# Patient Record
Sex: Female | Born: 1955 | Race: White | Hispanic: No | Marital: Married | Smoking: Former smoker
Health system: Southern US, Community
[De-identification: ages and names within clinical notes are randomized; demographics above are authoritative.]

## PROBLEM LIST (undated history)

## (undated) DIAGNOSIS — E119 Type 2 diabetes mellitus without complications: Secondary | ICD-10-CM

## (undated) DIAGNOSIS — Z860100 Personal history of colon polyps, unspecified: Secondary | ICD-10-CM

## (undated) DIAGNOSIS — Z720 Tobacco use: Secondary | ICD-10-CM

## (undated) DIAGNOSIS — E785 Hyperlipidemia, unspecified: Secondary | ICD-10-CM

## (undated) DIAGNOSIS — F419 Anxiety disorder, unspecified: Secondary | ICD-10-CM

## (undated) DIAGNOSIS — E786 Lipoprotein deficiency: Secondary | ICD-10-CM

## (undated) HISTORY — DX: Personal history of colonic polyps: Z86.010

## (undated) HISTORY — PX: CHOLECYSTECTOMY: SHX55

## (undated) HISTORY — DX: Personal history of colon polyps, unspecified: Z86.0100

## (undated) HISTORY — DX: Tobacco use: Z72.0

## (undated) HISTORY — DX: Hyperlipidemia, unspecified: E78.5

## (undated) HISTORY — PX: TONSILLECTOMY: SUR1361

## (undated) HISTORY — PX: APPENDECTOMY: SHX54

---

## 1999-10-29 ENCOUNTER — Other Ambulatory Visit: Admission: RE | Admit: 1999-10-29 | Discharge: 1999-10-29 | Payer: Self-pay | Admitting: Gynecology

## 2001-08-09 ENCOUNTER — Encounter: Payer: Self-pay | Admitting: Family Medicine

## 2001-08-09 ENCOUNTER — Encounter: Admission: RE | Admit: 2001-08-09 | Discharge: 2001-08-09 | Payer: Self-pay | Admitting: Family Medicine

## 2003-11-17 ENCOUNTER — Ambulatory Visit (HOSPITAL_COMMUNITY): Admission: RE | Admit: 2003-11-17 | Discharge: 2003-11-17 | Payer: Self-pay | Admitting: Internal Medicine

## 2005-06-20 ENCOUNTER — Other Ambulatory Visit: Admission: RE | Admit: 2005-06-20 | Discharge: 2005-06-20 | Payer: Self-pay | Admitting: Obstetrics and Gynecology

## 2005-07-04 ENCOUNTER — Encounter: Admission: RE | Admit: 2005-07-04 | Discharge: 2005-07-04 | Payer: Self-pay | Admitting: Obstetrics and Gynecology

## 2006-06-09 ENCOUNTER — Ambulatory Visit (HOSPITAL_COMMUNITY): Admission: RE | Admit: 2006-06-09 | Discharge: 2006-06-09 | Payer: Self-pay | Admitting: Internal Medicine

## 2006-12-18 ENCOUNTER — Ambulatory Visit (HOSPITAL_COMMUNITY): Admission: RE | Admit: 2006-12-18 | Discharge: 2006-12-18 | Payer: Self-pay | Admitting: Obstetrics and Gynecology

## 2007-01-11 ENCOUNTER — Ambulatory Visit (HOSPITAL_COMMUNITY): Admission: RE | Admit: 2007-01-11 | Discharge: 2007-01-11 | Payer: Self-pay | Admitting: Internal Medicine

## 2008-05-29 ENCOUNTER — Encounter: Admission: RE | Admit: 2008-05-29 | Discharge: 2008-06-27 | Payer: Self-pay | Admitting: Internal Medicine

## 2008-08-01 ENCOUNTER — Encounter: Admission: RE | Admit: 2008-08-01 | Discharge: 2008-08-01 | Payer: Self-pay | Admitting: Internal Medicine

## 2008-10-25 ENCOUNTER — Ambulatory Visit (HOSPITAL_COMMUNITY): Admission: RE | Admit: 2008-10-25 | Discharge: 2008-10-25 | Payer: Self-pay | Admitting: Internal Medicine

## 2009-11-27 ENCOUNTER — Encounter: Admission: RE | Admit: 2009-11-27 | Discharge: 2009-11-27 | Payer: Self-pay | Admitting: Internal Medicine

## 2010-07-25 ENCOUNTER — Ambulatory Visit (HOSPITAL_COMMUNITY): Admission: RE | Admit: 2010-07-25 | Discharge: 2010-07-25 | Payer: Self-pay | Admitting: Internal Medicine

## 2010-12-19 LAB — HM COLONOSCOPY

## 2011-01-28 ENCOUNTER — Other Ambulatory Visit: Payer: Self-pay | Admitting: Internal Medicine

## 2011-01-28 ENCOUNTER — Other Ambulatory Visit (HOSPITAL_COMMUNITY): Payer: Self-pay | Admitting: Internal Medicine

## 2011-01-28 DIAGNOSIS — R197 Diarrhea, unspecified: Secondary | ICD-10-CM

## 2011-01-28 DIAGNOSIS — R509 Fever, unspecified: Secondary | ICD-10-CM

## 2011-01-28 DIAGNOSIS — R1032 Left lower quadrant pain: Secondary | ICD-10-CM

## 2011-01-28 DIAGNOSIS — R109 Unspecified abdominal pain: Secondary | ICD-10-CM

## 2011-01-29 ENCOUNTER — Ambulatory Visit
Admission: RE | Admit: 2011-01-29 | Discharge: 2011-01-29 | Disposition: A | Discharge status: Home / Self Care | Payer: 59 | Source: Ambulatory Visit | Attending: Internal Medicine | Admitting: Internal Medicine

## 2011-01-29 DIAGNOSIS — R509 Fever, unspecified: Secondary | ICD-10-CM

## 2011-01-29 DIAGNOSIS — R197 Diarrhea, unspecified: Secondary | ICD-10-CM

## 2011-01-29 DIAGNOSIS — R109 Unspecified abdominal pain: Secondary | ICD-10-CM

## 2011-01-31 ENCOUNTER — Other Ambulatory Visit (HOSPITAL_COMMUNITY): Payer: Self-pay

## 2011-01-31 ENCOUNTER — Ambulatory Visit (HOSPITAL_COMMUNITY): Payer: 59

## 2012-04-29 ENCOUNTER — Ambulatory Visit (HOSPITAL_COMMUNITY)
Admission: RE | Admit: 2012-04-29 | Discharge: 2012-04-29 | Disposition: A | Discharge status: Home / Self Care | Payer: 59 | Source: Ambulatory Visit | Attending: Emergency Medicine | Admitting: Emergency Medicine

## 2012-04-29 ENCOUNTER — Other Ambulatory Visit (HOSPITAL_COMMUNITY): Payer: Self-pay | Admitting: Emergency Medicine

## 2012-04-29 DIAGNOSIS — R52 Pain, unspecified: Secondary | ICD-10-CM

## 2012-04-29 DIAGNOSIS — M25519 Pain in unspecified shoulder: Secondary | ICD-10-CM | POA: Insufficient documentation

## 2012-04-29 DIAGNOSIS — M7730 Calcaneal spur, unspecified foot: Secondary | ICD-10-CM | POA: Insufficient documentation

## 2012-04-29 DIAGNOSIS — M25579 Pain in unspecified ankle and joints of unspecified foot: Secondary | ICD-10-CM | POA: Insufficient documentation

## 2012-04-29 DIAGNOSIS — M542 Cervicalgia: Secondary | ICD-10-CM | POA: Insufficient documentation

## 2012-04-29 DIAGNOSIS — M79609 Pain in unspecified limb: Secondary | ICD-10-CM | POA: Insufficient documentation

## 2012-12-02 ENCOUNTER — Emergency Department (HOSPITAL_COMMUNITY)
Admission: EM | Admit: 2012-12-02 | Discharge: 2012-12-02 | Disposition: A | Discharge status: Home / Self Care | Payer: 59 | Attending: Emergency Medicine | Admitting: Emergency Medicine

## 2012-12-02 ENCOUNTER — Encounter (HOSPITAL_COMMUNITY): Payer: Self-pay | Admitting: *Deleted

## 2012-12-02 DIAGNOSIS — Z79899 Other long term (current) drug therapy: Secondary | ICD-10-CM | POA: Insufficient documentation

## 2012-12-02 DIAGNOSIS — E7800 Pure hypercholesterolemia, unspecified: Secondary | ICD-10-CM | POA: Insufficient documentation

## 2012-12-02 DIAGNOSIS — T48905A Adverse effect of unspecified agents primarily acting on the respiratory system, initial encounter: Secondary | ICD-10-CM | POA: Insufficient documentation

## 2012-12-02 DIAGNOSIS — Y929 Unspecified place or not applicable: Secondary | ICD-10-CM | POA: Insufficient documentation

## 2012-12-02 DIAGNOSIS — F17200 Nicotine dependence, unspecified, uncomplicated: Secondary | ICD-10-CM | POA: Insufficient documentation

## 2012-12-02 DIAGNOSIS — E119 Type 2 diabetes mellitus without complications: Secondary | ICD-10-CM | POA: Insufficient documentation

## 2012-12-02 DIAGNOSIS — R51 Headache: Secondary | ICD-10-CM | POA: Insufficient documentation

## 2012-12-02 DIAGNOSIS — Z888 Allergy status to other drugs, medicaments and biological substances status: Secondary | ICD-10-CM | POA: Insufficient documentation

## 2012-12-02 DIAGNOSIS — L299 Pruritus, unspecified: Secondary | ICD-10-CM | POA: Insufficient documentation

## 2012-12-02 DIAGNOSIS — Y939 Activity, unspecified: Secondary | ICD-10-CM | POA: Insufficient documentation

## 2012-12-02 DIAGNOSIS — F411 Generalized anxiety disorder: Secondary | ICD-10-CM | POA: Insufficient documentation

## 2012-12-02 DIAGNOSIS — T7840XA Allergy, unspecified, initial encounter: Secondary | ICD-10-CM

## 2012-12-02 DIAGNOSIS — J069 Acute upper respiratory infection, unspecified: Secondary | ICD-10-CM | POA: Insufficient documentation

## 2012-12-02 HISTORY — DX: Anxiety disorder, unspecified: F41.9

## 2012-12-02 HISTORY — DX: Lipoprotein deficiency: E78.6

## 2012-12-02 HISTORY — DX: Type 2 diabetes mellitus without complications: E11.9

## 2012-12-02 LAB — BASIC METABOLIC PANEL: Calcium: 9 mg/dL (ref 8.4–10.5)

## 2012-12-02 LAB — CBC WITH DIFFERENTIAL/PLATELET
Basophils Relative: 0 % (ref 0–1)
HCT: 47.5 % — ABNORMAL HIGH (ref 36.0–46.0)
Hemoglobin: 17.2 g/dL — ABNORMAL HIGH (ref 12.0–15.0)
Lymphocytes Relative: 25 % (ref 12–46)
MCH: 32.5 pg (ref 26.0–34.0)
MCV: 89.6 fL (ref 78.0–100.0)
RBC: 5.3 MIL/uL — ABNORMAL HIGH (ref 3.87–5.11)
RDW: 12.3 % (ref 11.5–15.5)
WBC: 9.9 10*3/uL (ref 4.0–10.5)

## 2012-12-02 MED ORDER — EPINEPHRINE HCL 1 MG/ML IJ SOLN
0.3000 mg | Freq: Once | INTRAMUSCULAR | Status: AC
  Administered 2012-12-02: 0.3 mg via INTRAMUSCULAR
  Filled 2012-12-02: qty 1

## 2012-12-02 MED ORDER — METHYLPREDNISOLONE SODIUM SUCC 125 MG IJ SOLR
125.0000 mg | Freq: Once | INTRAMUSCULAR | Status: AC
  Administered 2012-12-02: 125 mg via INTRAVENOUS
  Filled 2012-12-02: qty 2

## 2012-12-02 MED ORDER — DIPHENHYDRAMINE HCL 50 MG/ML IJ SOLN
INTRAMUSCULAR | Status: AC
  Administered 2012-12-02: 25 mg
  Filled 2012-12-02: qty 1

## 2012-12-02 NOTE — ED Notes (Signed)
Patient here with possible allergic reaction?  Patient took her levaquin (1st dose this am) and right after she took the medication, she became lethargic, altered mental status when [facility] arrived.  Syncopal episode per [facility], per husband, she had headache on the back side of her head and  Neck pain.  Patient is alert and oriented x 3.  Equal grips.  MAE.  Patient denies pain and states that she feels tired.

## 2012-12-02 NOTE — ED Notes (Addendum)
50mg  of benadryl, 50 mg zantac IV, and 4mg  zofran PTA, 500cc of ns, cbg 260

## 2012-12-02 NOTE — ED Provider Notes (Signed)
History     CSN: [id number]  Arrival date & time 12/02/12  9629   First MD Initiated Contact with Patient 12/02/12 1030      Chief Complaint  Patient presents with  . Allergic Reaction    (Consider location/radiation/quality/duration/timing/severity/associated sxs/prior treatment) Patient is a 57 y.o. female presenting with allergic reaction. The history is provided by the patient.  Allergic Reaction  She was prescribed levofloxacin yesterday for a respiratory tract infection. She took her first dose this morning and shortly after that started itching and breaking out in a rash. She was noted to be somewhat lethargic and is reported to have had a syncopal episode. She has been having an intermittent occipital headache. She denies difficulty breathing or swallowing currently. She has taken Levaquin in the past without problems and has never had any drug allergies.  Past Medical History  Diagnosis Date  . Diabetes mellitus without complication   . Anxiety   . Hypocholesteremia     Past Surgical History  Procedure Laterality Date  . Cesarean section    . Tonsillectomy    . Appendectomy    . Cholecystectomy      No family history on file.  History  Substance Use Topics  . Smoking status: Current Every Day Smoker -- 0.50 packs/day    Types: Cigarettes  . Smokeless tobacco: Not on file  . Alcohol Use: No    OB History   Grav Para Term Preterm Abortions TAB SAB Ect Mult Living                  Review of Systems  All other systems reviewed and are negative.    Allergies  Latex and Levaquin  Home Medications   Current Outpatient Rx  Name  Route  Sig  Dispense  Refill  . Citalopram Hydrobromide (CELEXA PO)   Oral   Take by mouth.         . Glimepiride (AMARYL PO)   Oral   Take by mouth.         . Ibuprofen (ADVIL PO)   Oral   Take by mouth.         . Montelukast Sodium (SINGULAIR PO)   Oral   Take by mouth.           BP 90/63  Pulse 64   Temp(Src) 97.8 F (36.6 C) (Oral)  Resp 18  SpO2 97%  Physical Exam  Nursing note and vitals reviewed.  57 year old female, resting comfortably and in no acute distress. Vital signs are significant for borderline bradycardia with blood pressure of 90/63. However, her husband states that she normally runs a low blood pressure. Oxygen saturation is 97%, which is normal. Head is normocephalic and atraumatic. PERRLA, EOMI. Oropharynx shows mild edema of the uvula. Neck is nontender and supple without adenopathy or JVD. Back is nontender and there is no CVA tenderness. Lungs are clear without rales, wheezes, or rhonchi. Chest is nontender. Heart has regular rate and rhythm without murmur. Abdomen is soft, flat, nontender without masses or hepatosplenomegaly and peristalsis is normoactive. Extremities have no cyanosis or edema, full range of motion is present. Skin is warm and dry. Diffuse erythematous rash is present without any urticarial lesions. Neurologic: Mental status is normal, cranial nerves are intact, there are no motor or sensory deficits.  ED Course  Procedures (including critical care time)  Results for orders placed during the hospital encounter of 12/02/12  CBC WITH DIFFERENTIAL  Result Value Range   WBC 9.9  4.0 - 10.5 K/uL   RBC 5.30 (*) 3.87 - 5.11 MIL/uL   Hemoglobin 17.2 (*) 12.0 - 15.0 g/dL   HCT 96.0 (*) 45.4 - 09.8 %   MCV 89.6  78.0 - 100.0 fL   MCH 32.5  26.0 - 34.0 pg   MCHC 36.2 (*) 30.0 - 36.0 g/dL   RDW 11.9  14.7 - 82.9 %   Platelets 278  150 - 400 K/uL   Neutrophils Relative 71  43 - 77 %   Neutro Abs 7.1  1.7 - 7.7 K/uL   Lymphocytes Relative 25  12 - 46 %   Lymphs Abs 2.5  0.7 - 4.0 K/uL   Monocytes Relative 3  3 - 12 %   Monocytes Absolute 0.3  0.1 - 1.0 K/uL   Eosinophils Relative 1  0 - 5 %   Eosinophils Absolute 0.1  0.0 - 0.7 K/uL   Basophils Relative 0  0 - 1 %   Basophils Absolute 0.0  0.0 - 0.1 K/uL  BASIC METABOLIC PANEL       Result Value Range   Sodium 137  135 - 145 mEq/L   Potassium 4.2  3.5 - 5.1 mEq/L   Chloride 100  96 - 112 mEq/L   CO2 23  19 - 32 mEq/L   Glucose, Bld 310 (*) 70 - 99 mg/dL   BUN 12  6 - 23 mg/dL   Creatinine, Ser 5.62  0.50 - 1.10 mg/dL   Calcium 9.0  8.4 - 13.0 mg/dL   GFR calc non Af Amer >90  >90 mL/min   GFR calc Af Amer >90  >90 mL/min    ECG shows normal sinus rhythm with a rate of 64, no ectopy. Normal axis. Normal P wave. Normal QRS. Normal intervals. Normal ST and T waves. Impression: normal ECG. No prior ECG available for comparison.   1. Allergic reaction to drug       MDM  Acute allergic reaction to levofloxacin. She received diphenhydramine, ranitidine, and ondansetron in the ambulance. She's given an injection of epinephrine and a dose of methylprednisolone in the ED.  She feels much better after the above noted that treatment. She was observed in the ED with no recurrence of symptoms. She is discharged and advised to use over-the-counter Claritin or Zyrtec and supplement with Benadryl as needed. She is to discontinue her levofloxacin. She does not have clinical bacterial infection at this time, so no replacement antibiotic was given.      Dione Booze, MD 12/02/12 1330

## 2012-12-15 ENCOUNTER — Ambulatory Visit (HOSPITAL_COMMUNITY)
Admission: RE | Admit: 2012-12-15 | Discharge: 2012-12-15 | Disposition: A | Discharge status: Home / Self Care | Payer: 59 | Source: Ambulatory Visit | Attending: Internal Medicine | Admitting: Internal Medicine

## 2012-12-15 ENCOUNTER — Other Ambulatory Visit (HOSPITAL_COMMUNITY): Payer: Self-pay | Admitting: Internal Medicine

## 2012-12-15 DIAGNOSIS — R059 Cough, unspecified: Secondary | ICD-10-CM

## 2012-12-15 DIAGNOSIS — F17200 Nicotine dependence, unspecified, uncomplicated: Secondary | ICD-10-CM | POA: Insufficient documentation

## 2012-12-15 DIAGNOSIS — R05 Cough: Secondary | ICD-10-CM

## 2013-03-24 ENCOUNTER — Ambulatory Visit: Payer: 59 | Admitting: Cardiovascular Disease

## 2013-03-31 ENCOUNTER — Encounter: Payer: Self-pay | Admitting: Cardiovascular Disease

## 2013-03-31 ENCOUNTER — Ambulatory Visit (INDEPENDENT_AMBULATORY_CARE_PROVIDER_SITE_OTHER): Payer: 59 | Admitting: Cardiovascular Disease

## 2013-03-31 VITALS — BP 122/70 | HR 79 | Ht 63.0 in | Wt 180.0 lb

## 2013-03-31 DIAGNOSIS — M549 Dorsalgia, unspecified: Secondary | ICD-10-CM | POA: Insufficient documentation

## 2013-03-31 DIAGNOSIS — I10 Essential (primary) hypertension: Secondary | ICD-10-CM

## 2013-03-31 DIAGNOSIS — E119 Type 2 diabetes mellitus without complications: Secondary | ICD-10-CM

## 2013-03-31 DIAGNOSIS — F17200 Nicotine dependence, unspecified, uncomplicated: Secondary | ICD-10-CM

## 2013-03-31 DIAGNOSIS — Z87891 Personal history of nicotine dependence: Secondary | ICD-10-CM | POA: Insufficient documentation

## 2013-03-31 DIAGNOSIS — Z720 Tobacco use: Secondary | ICD-10-CM

## 2013-03-31 NOTE — Patient Instructions (Signed)
  We will see you back in follow up in 6 months  Dr Allyson Sabal has ordered an exercise stress test

## 2013-03-31 NOTE — Assessment & Plan Note (Addendum)
She has had 3 episodes of mid scapular back pain for unclear reasons. There was no associated chest pain radiation shortness of breath or diaphoresis. Because of her risk factors including a brother who had an MI at 40, and ongoing tobacco abuse, hypertension and hyperlipidemia, is really possible that this is an initial equivalent. She did have a negative Myoview stress test in 2008. And then a repeat test to rule out an ischemic etiology.

## 2013-03-31 NOTE — Progress Notes (Signed)
03/31/2013 Candice [name]   December 04, 1955  [id number]  Primary Physician Berenice Primas, PA-C Primary Cardiologist: Runell Gess MD Roseanne Reno  HPI:  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 She was previously a patient of Dr. Chanda Busing last saw her in the office 02/28/07. Risk factors include continued tobacco abuse of one half pack per day though she is trying to quit, treated diabetes and hyperlipidemia. She does have a family history of heart disease with a brother who had a myocardial infarction at age 3. She's never had a heart attack or stroke and denies chest pain or shortness of breath.   Current Outpatient Prescriptions  Medication Sig Dispense Refill  . aspirin EC 81 MG tablet Take 81 mg by mouth daily.      . citalopram (CELEXA) 20 MG tablet Take 20 mg by mouth daily.      . Dapagliflozin Propanediol (FARXIGA) 10 MG TABS Take 1 tablet by mouth at bedtime.      Marland Kitchen ibuprofen (ADVIL,MOTRIN) 200 MG tablet Take 200 mg by mouth every 6 (six) hours as needed for headache.      . montelukast (SINGULAIR) 10 MG tablet Take 10 mg by mouth at bedtime.      . rosuvastatin (CRESTOR) 20 MG tablet Take 20 mg by mouth daily.       No current facility-administered medications for this visit.    Allergies  Allergen Reactions  . Levaquin (Levofloxacin) Other (See Comments)    Tingling sensation to mouth, near LOC, blurred vision,    . Iodine   . Latex Itching and Rash    History   Social History  . Marital Status: Married    Spouse Name: N/A    Number of Children: N/A  . Years of Education: N/A   Occupational History  . Not on file.   Social History Main Topics  . Smoking status: Current Every Day Smoker -- 0.50 packs/day for 30 years    Types: Cigarettes  . Smokeless tobacco: Not on file     Comment: Just purchased smoking patches  . Alcohol Use: No  .  Drug Use: No  . Sexually Active: Yes   Other Topics Concern  . Not on file   Social History Narrative  . No narrative on file     Review of Systems: General: negative for chills, fever, night sweats or weight changes.  Cardiovascular: negative for chest pain, dyspnea on exertion, edema, orthopnea, palpitations, paroxysmal nocturnal dyspnea or shortness of breath Dermatological: negative for rash Respiratory: negative for cough or wheezing Urologic: negative for hematuria Abdominal: negative for nausea, vomiting, diarrhea, bright red blood per rectum, melena, or hematemesis Neurologic: negative for visual changes, syncope, or dizziness All other systems reviewed and are otherwise negative except as noted above.    Blood pressure 122/70, pulse 79, height 5\' 3"  (1.6 m), weight 180 lb (81.647 kg).  General appearance: alert and no distress Neck: no adenopathy, no carotid bruit, no JVD, supple, symmetrical, trachea midline and thyroid not enlarged, symmetric, no tenderness/mass/nodules Lungs: clear to auscultation bilaterally Heart: regular rate and rhythm, S1, S2 normal, no murmur, click, rub or gallop Abdomen: soft, non-tender; bowel sounds normal; no masses,  no organomegaly Extremities: extremities normal, atraumatic, no cyanosis or edema Pulses: 2+ and symmetric  EKG normal sinus rhythm at 79 without ST or T wave changes  ASSESSMENT AND PLAN:   Back  pain She has had 3 episodes of mid scapular back pain for unclear reasons. There was no associated chest pain radiation shortness of breath or diaphoresis. Because of her risk factors including a brother who had an MI at 67, and ongoing tobacco abuse, hypertension and hyperlipidemia, is really possible that this is an initial equivalent. She did have a negative Myoview stress test in 2008. And then a repeat test to rule out an ischemic etiology.      Runell Gess MD FACP,FACC,FAHA, [facility] 03/31/2013 4:11 PM

## 2013-05-03 ENCOUNTER — Encounter (HOSPITAL_COMMUNITY): Payer: 59

## 2013-05-25 ENCOUNTER — Encounter (HOSPITAL_COMMUNITY): Payer: 59

## 2013-09-29 ENCOUNTER — Other Ambulatory Visit: Payer: Self-pay | Admitting: Emergency Medicine

## 2013-09-29 MED ORDER — CITALOPRAM HYDROBROMIDE 20 MG PO TABS: 20.0000 mg | ORAL_TABLET | Freq: Every day | ORAL | Status: DC

## 2013-10-14 ENCOUNTER — Telehealth (HOSPITAL_COMMUNITY): Payer: Self-pay | Admitting: *Deleted

## 2013-10-20 ENCOUNTER — Ambulatory Visit (HOSPITAL_COMMUNITY)
Admission: RE | Admit: 2013-10-20 | Discharge: 2013-10-20 | Disposition: A | Discharge status: Home / Self Care | Payer: 59 | Source: Ambulatory Visit | Attending: Vascular Surgery | Admitting: Vascular Surgery

## 2013-10-20 ENCOUNTER — Ambulatory Visit (INDEPENDENT_AMBULATORY_CARE_PROVIDER_SITE_OTHER): Payer: 59 | Admitting: Emergency Medicine

## 2013-10-20 ENCOUNTER — Other Ambulatory Visit: Payer: Self-pay | Admitting: Emergency Medicine

## 2013-10-20 ENCOUNTER — Encounter: Payer: Self-pay | Admitting: Emergency Medicine

## 2013-10-20 VITALS — BP 118/64 | HR 84 | Temp 98.2°F | Resp 18 | Ht 63.5 in | Wt 174.0 lb

## 2013-10-20 DIAGNOSIS — E119 Type 2 diabetes mellitus without complications: Secondary | ICD-10-CM

## 2013-10-20 DIAGNOSIS — M25562 Pain in left knee: Secondary | ICD-10-CM

## 2013-10-20 DIAGNOSIS — M79609 Pain in unspecified limb: Secondary | ICD-10-CM

## 2013-10-20 DIAGNOSIS — Z8739 Personal history of other diseases of the musculoskeletal system and connective tissue: Secondary | ICD-10-CM

## 2013-10-20 DIAGNOSIS — M25569 Pain in unspecified knee: Secondary | ICD-10-CM

## 2013-10-20 DIAGNOSIS — E663 Overweight: Secondary | ICD-10-CM

## 2013-10-20 DIAGNOSIS — E782 Mixed hyperlipidemia: Secondary | ICD-10-CM

## 2013-10-20 MED ORDER — MONTELUKAST SODIUM 10 MG PO TABS: 10.0000 mg | ORAL_TABLET | Freq: Every day | ORAL | Status: DC

## 2013-10-20 MED ORDER — GLUCOSE BLOOD VI STRP: ORAL_STRIP | Status: DC

## 2013-10-20 MED ORDER — CITALOPRAM HYDROBROMIDE 20 MG PO TABS: 20.0000 mg | ORAL_TABLET | Freq: Every day | ORAL | Status: DC

## 2013-10-20 NOTE — Progress Notes (Signed)
Subjective:    Patient ID: Candice [name], female    DOB: 1956-01-29, 58 y.o.   MRN: [id number]  HPI Comments: 58 yo noncompliant female presents for 3 month F/U for Cholesterol, DM, D. Deficient.  Off Crestor for several months, but did not take routinely when she was on RX, she denies any SE with RX. She is trying to improve diet and exercise. LAST LABS B12 312 BS 241 T 300 TG 652 H 39 A1C 8.8  FRUCTOSAMINE 337 She notes BS at home 110-160s. She wants to stop smoking and has patches at home, but is concerned because she does not want to gain weight.  She notes Left calf pain started yesterday with out injury or strain. Denies any swelling, redness. She is concerned with PMH for DVT.     Medication List       This list is accurate as of: 10/20/13  3:35 PM.  Always use your most recent med list.               aspirin EC 81 MG tablet  Take 81 mg by mouth daily.     citalopram 20 MG tablet  Commonly known as:  CELEXA  Take 1 tablet (20 mg total) by mouth daily.     FARXIGA 5 MG Tabs  Generic drug:  Dapagliflozin Propanediol  Take 5 mg by mouth daily.     ibuprofen 200 MG tablet  Commonly known as:  ADVIL,MOTRIN  Take 200 mg by mouth every 6 (six) hours as needed for headache.     montelukast 10 MG tablet  Commonly known as:  SINGULAIR  Take 10 mg by mouth at bedtime.     rosuvastatin 20 MG tablet  Commonly known as:  CRESTOR  Take 20 mg by mouth daily.       Past Medical History  Diagnosis Date  . Diabetes mellitus without complication   . Anxiety   . Hypocholesteremia   . Hypertension   . Tobacco abuse     ALLERGIES Levaquin; Amoxicillin; Eggs or egg-derived products; Iodine; Kombiglyze; Metformin and related; Penicillins; Shellfish allergy; Vitamin d analogs; and Latex    Review of Systems  Musculoskeletal: Positive for myalgias.  All other systems reviewed and are negative.   BP 118/64  Pulse 84  Temp(Src) 98.2 F (36.8 C) (Temporal)  Resp 18   Ht 5' 3.5" (1.613 m)  Wt 174 lb (78.926 kg)  BMI 30.34 kg/m2     Objective:   Physical Exam  Nursing note and vitals reviewed. Constitutional: She is oriented to person, place, and time. She appears well-developed and well-nourished. No distress.  HENT:  Head: Normocephalic and atraumatic.  Right Ear: External ear normal.  Left Ear: External ear normal.  Nose: Nose normal.  Mouth/Throat: Oropharynx is clear and moist.  Eyes: Conjunctivae and EOM are normal.  Neck: Normal range of motion. Neck supple. No JVD present. No thyromegaly present.  Cardiovascular: Normal rate, regular rhythm, normal heart sounds and intact distal pulses.   Pulmonary/Chest: Effort normal and breath sounds normal.  Abdominal: Soft. Bowel sounds are normal. She exhibits no distension and no mass. There is no tenderness. There is no rebound and no guarding.  Musculoskeletal: Normal range of motion. She exhibits tenderness. She exhibits no edema.  Left calf mild tenderness with palpation but - Homans or Cords  Lymphadenopathy:    She has no cervical adenopathy.  Neurological: She is alert and oriented to person, place, and time. No cranial nerve  deficit.  Skin: Skin is warm and dry. No rash noted. No erythema. No pallor.  Psychiatric: She has a normal mood and affect. Her behavior is normal. Judgment and thought content normal.          Assessment & Plan:  1.  3 month F/U for noncompliant Overweight,Cholesterol,DM, D. Deficient. Needs healthy diet, cardio QD and obtain healthy weight. Check Labs, Check BP if >130/80 call office, Check BS if >200 call office 2. Left calf pain- ? DVT vs strain- get Doppler to evaluate, try to R.I.C.E w/c if symptoms increase 3. Tobacco dependence- discussed need for d/c, she will try patches and w/c if has any concerns

## 2013-10-20 NOTE — Patient Instructions (Signed)
Deep Vein Thrombosis A deep vein thrombosis (DVT) is a blood clot that develops in a deep vein. A DVT is a clot in the deep, larger veins of the leg, arm, or pelvis. These are more dangerous than clots that might form in veins near the surface of the body. A DVT can lead to complications if the clot breaks off and travels in the bloodstream to the lungs.  A DVT can damage the valves in your leg veins, so that instead of flowing upwards, the blood pools in the lower leg. This is called post-thrombotic syndrome, and can result in pain, swelling, discoloration, and sores on the leg. Once identified, a DVT can be treated. It can also be prevented in some circumstances. Once you have had a DVT, you may be at increased risk for a DVT in the future. CAUSES Blood clots form in a vein for different reasons. Usually several things contribute to blood clots. Contributing factors include:  The flow of blood slows down.  The inside of the vein is damaged in some way.  The person has a condition that makes blood clot more easily. Some people are more likely than others to develop blood clots. That is because they have more factors that make clots likely. These are called risk factors. Risk factors include:   Older age, especially over 75 years old.  Having a history of blood clots. This means you have had one before. Or, it means that someone else in your family has had blood clots. You may have a genetic tendency to form clots.  Having major or lengthy surgery. This is especially true for surgery on the hip, knee, or belly (abdomen). Hip surgery is particularly high risk.  Breaking a hip or leg.  Sitting or lying still for a long time. This includes long distance travel, paralysis, or recovery from an illness or surgery.  Cancer, or cancer treatment.  Having a long, thin tube (catheter) placed inside a vein during a medical procedure.  Being overweight (obese).  Pregnancy and childbirth. Hormone  changes make the blood clot more easily during pregnancy. The fetus puts pressure on the veins of the pelvis. There is also risk of injury to veins during delivery or a caesarean. The risk is at its highest just after childbirth.  Medicines with the female hormone estrogen. This includes birth control pills and hormone replacement therapy.  Smoking.  Other circulation or heart problems. SYMPTOMS When a clot forms, it can either partially or totally block the blood flow in that vein. Symptoms of a DVT can include:  Swelling of the leg or arm, especially if one side is much worse.  Warmth and redness of the leg or arm, especially if one side is much worse.  Pain in an arm or leg. If the clot is in the leg, symptoms may be more noticeable or worse when standing or walking. The symptoms of a DVT that has traveled to the lungs (pulmonary embolism, PE) usually start suddenly, and include:  Shortness of breath.  Coughing.  Coughing up blood or blood-tinged phlegm.  Chest pain. The chest pain is often worse with deep breaths.  Rapid heartbeat. Anyone with these symptoms should get emergency medical treatment right away. Call your local emergency services (911 in [place] if you have these symptoms. DIAGNOSIS If a DVT is suspected, your caregiver will take a full medical history and carry out a physical exam. Tests that also may be required include:  Blood tests, including studies of   the clotting properties of the blood.  Ultrasonography to see if you have clots in your legs or lungs.  X-rays to show the flow of blood when dye is injected into the veins (venography).  Studies of your lungs, if you have any chest symptoms. PREVENTION  Exercise the legs regularly. Take a brisk 30 minute walk every day.  Maintain a weight that is appropriate for your height.  Avoid sitting or lying in bed for long periods of time without moving your legs.  Women, particularly those over the age of 35,  should consider the risks and benefits of taking estrogen medicines, including birth control pills.  Do not smoke, especially if you take estrogen medicines.  Long distance travel can increase your risk of DVT. You should exercise your legs by walking or pumping the muscles every hour.  In-hospital prevention:  Many of the risk factors above relate to situations that exist with hospitalization, either for illness, injury, or elective surgery.  Your caregiver will assess you for the need for venous thromboembolism prophylaxis when you are admitted to the hospital. If you are having surgery, your surgeon will assess you the day of or day after surgery.  Prevention may include medical and nonmedical measures. TREATMENT Treatment for DVT helps prevent death and disability. The most common treatment for DVT is blood thinning (anticoagulant) medicine, which reduces the blood's tendency to clot. Anticoagulants can stop new blood clots from forming and old ones from growing. They cannot dissolve existing clots. Your body does this by itself over time. Anticoagulants can be given by mouth, by intravenous (IV) access, or by injection. Your caregiver will determine the best program for you.  Heparin or related medicines (low molecular weight heparin) are usually the first treatment for a blood clot. They act quickly. However, they cannot be taken orally.  Heparin can cause a fall in a component of blood that stops bleeding and forms blood clots (platelets). You will be monitored with blood tests to be sure this does not occur.  Warfarin is an anticoagulant that can be swallowed (taken orally). It takes a few days to start working, so usually heparin or related medicines are used in combination. Once warfarin is working, heparin is usually stopped.  Less commonly, clot dissolving drugs (thrombolytics) are used to dissolve a DVT. They carry a high risk of bleeding, so they are used mainly in severe cases,  where a life or limb is threatened.  Very rarely, a blood clot in the leg needs to be removed surgically.  If you are unable to take anticoagulants, your caregiver may arrange for you to have a filter placed in a main vein in your belly (abdomen). This filter prevents clots from traveling to your lungs. HOME CARE INSTRUCTIONS  Take all medicines prescribed by your caregiver. Follow the directions carefully.  Warfarin. Most people will continue taking warfarin after hospital discharge. Your caregiver will advise you on the length of treatment (usually 3 6 months, sometimes lifelong).  Too much and too little warfarin are both dangerous. Too much warfarin increases the risk of bleeding. Too little warfarin continues to allow the risk for blood clots. While taking warfarin, you will need to have regular blood tests to measure your blood clotting time. These blood tests usually include both the prothrombin time (PT) and international normalized ratio (INR) tests. The PT and INR results allow your caregiver to adjust your dose of warfarin. The dose can change for many reasons. It is critically important that   you take warfarin exactly as prescribed, and that you have your PT and INR levels drawn exactly as directed.  Many foods, especially foods high in vitamin K can interfere with warfarin and affect the PT and INR results. Foods high in vitamin K include spinach, kale, broccoli, cabbage, collard and turnip greens, brussels sprouts, peas, cauliflower, seaweed, and parsley as well as beef and pork liver, green tea, and soybean oil. You should eat a consistent amount of foods high in vitamin K. Avoid major changes in your diet, or notify your caregiver before changing your diet. Arrange a visit with a dietitian to answer your questions.  Many medicines can interfere with warfarin and affect the PT and INR results. You must tell your caregiver about any and all medicines you take, this includes all vitamins  and supplements. Be especially cautious with aspirin and anti-inflammatory medicines. Ask your caregiver before taking these. Do not take or discontinue any prescribed or over-the-counter medicine except on the advice of your caregiver or pharmacist.  Warfarin can have side effects, primarily excessive bruising or bleeding. You will need to hold pressure over cuts for longer than usual. Your caregiver or pharmacist will discuss other potential side effects.  Alcohol can change the body's ability to handle warfarin. It is best to avoid alcoholic drinks or consume only very small amounts while taking warfarin. Notify your caregiver if you change your alcohol intake.  Notify your dentist or other caregivers before procedures.  Activity. Ask your caregiver how soon you can go back to normal activities. It is important to stay active to prevent blood clots. If you are on anticoagulant medicine, avoid contact sports.  Exercise. It is very important to exercise. This is especially important while traveling, sitting or standing for long periods of time. Exercise your legs by walking or by pumping the muscles frequently. Take frequent walks.  Compression stockings. These are tight elastic stockings that apply pressure to the lower legs. This pressure can help keep the blood in the legs from clotting. You may need to wear compressions stockings at home to help prevent a DVT.  Smoking. If you smoke, quit. Ask your caregiver for help with quitting smoking.  Learn as much as you can about DVT. Knowing more about the condition should help you keep it from coming back.  Wear a medical alert bracelet or carry a medical alert card. SEEK MEDICAL CARE IF:  You notice a rapid heartbeat.  You feel weaker or more tired than usual.  You feel faint.  You notice increased bruising.  You feel your symptoms are not getting better in the time expected.  You believe you are having side effects of medicine. SEEK  IMMEDIATE MEDICAL CARE IF:  You have chest pain.  You have trouble breathing.  You have new or increased swelling or pain in one leg.  You cough up blood.  You notice blood in vomit, in a bowel movement, or in urine. MAKE SURE YOU:  Understand these instructions.  Will watch your condition.  Will get help right away if you are not doing well or get worse. Document Released: 09/29/2005 Document Revised: 06/23/2012 Document Reviewed: 11/21/2010 [employer] Patient Information 2014 [employer].  

## 2013-10-22 DIAGNOSIS — E785 Hyperlipidemia, unspecified: Secondary | ICD-10-CM

## 2013-10-22 DIAGNOSIS — E781 Pure hyperglyceridemia: Secondary | ICD-10-CM | POA: Insufficient documentation

## 2013-10-22 DIAGNOSIS — E1169 Type 2 diabetes mellitus with other specified complication: Secondary | ICD-10-CM | POA: Insufficient documentation

## 2013-11-01 ENCOUNTER — Telehealth (HOSPITAL_COMMUNITY): Payer: Self-pay | Admitting: *Deleted

## 2013-11-07 ENCOUNTER — Telehealth (HOSPITAL_COMMUNITY): Payer: Self-pay | Admitting: *Deleted

## 2013-11-28 ENCOUNTER — Other Ambulatory Visit: Payer: Self-pay | Admitting: *Deleted

## 2013-11-28 MED ORDER — DAPAGLIFLOZIN PROPANEDIOL 5 MG PO TABS: 5.0000 mg | ORAL_TABLET | Freq: Every day | ORAL | Status: DC

## 2014-01-09 ENCOUNTER — Encounter: Payer: Self-pay | Admitting: *Deleted

## 2014-01-10 ENCOUNTER — Encounter: Payer: Self-pay | Admitting: Emergency Medicine

## 2014-01-10 ENCOUNTER — Ambulatory Visit (INDEPENDENT_AMBULATORY_CARE_PROVIDER_SITE_OTHER): Payer: 59 | Admitting: Emergency Medicine

## 2014-01-10 VITALS — BP 92/58 | HR 84 | Temp 98.0°F | Resp 18 | Ht 63.5 in | Wt 172.0 lb

## 2014-01-10 DIAGNOSIS — R5381 Other malaise: Secondary | ICD-10-CM

## 2014-01-10 DIAGNOSIS — R197 Diarrhea, unspecified: Secondary | ICD-10-CM

## 2014-01-10 DIAGNOSIS — R5383 Other fatigue: Secondary | ICD-10-CM

## 2014-01-10 LAB — CBC WITH DIFFERENTIAL/PLATELET
BASOS ABS: 0 10*3/uL (ref 0.0–0.1)
Eosinophils Absolute: 0 10*3/uL (ref 0.0–0.7)
HCT: 52.9 % — ABNORMAL HIGH (ref 36.0–46.0)
HEMOGLOBIN: 17.4 g/dL — AB (ref 12.0–15.0)
Lymphocytes Relative: 30 % (ref 12–46)
Lymphs Abs: 1.4 10*3/uL (ref 0.7–4.0)
MCH: 30.5 pg (ref 26.0–34.0)
MCHC: 33 g/dL (ref 30.0–36.0)
MCV: 92.7 fL (ref 78.0–100.0)
MONOS PCT: 7 % (ref 3–12)
Monocytes Absolute: 0.3 10*3/uL (ref 0.1–1.0)
NEUTROS ABS: 3 10*3/uL (ref 1.7–7.7)
NEUTROS PCT: 63 % (ref 43–77)
PLATELETS: 119 10*3/uL — AB (ref 150–400)
RBC: 5.7 MIL/uL — ABNORMAL HIGH (ref 3.87–5.11)
RDW: 12.6 % (ref 11.5–15.5)
WBC: 4.8 10*3/uL (ref 4.0–10.5)

## 2014-01-10 LAB — COMPREHENSIVE METABOLIC PANEL
ALT: 46 U/L — AB (ref 0–35)
AST: 31 U/L (ref 0–37)
Albumin: 4.5 g/dL (ref 3.5–5.2)
Alkaline Phosphatase: 111 U/L (ref 39–117)
BUN: 13 mg/dL (ref 6–23)
CALCIUM: 9.2 mg/dL (ref 8.4–10.5)
CHLORIDE: 104 meq/L (ref 96–112)
CO2: 22 mEq/L (ref 19–32)
CREATININE: 0.6 mg/dL (ref 0.50–1.10)
Glucose, Bld: 159 mg/dL — ABNORMAL HIGH (ref 70–99)
Potassium: 4 mEq/L (ref 3.5–5.3)
SODIUM: 138 meq/L (ref 135–145)
TOTAL PROTEIN: 7.5 g/dL (ref 6.0–8.3)
Total Bilirubin: 0.4 mg/dL (ref 0.2–1.2)

## 2014-01-10 MED ORDER — METRONIDAZOLE 500 MG PO TABS: 500.0000 mg | ORAL_TABLET | Freq: Three times a day (TID) | ORAL | Status: DC

## 2014-01-10 NOTE — Patient Instructions (Signed)
Dehydration, Adult Dehydration means your body does not have as much fluid as it needs. Your kidneys, brain, and heart will not work properly without the right amount of fluids and salt.  HOME CARE  Ask your doctor how to replace body fluid losses (rehydrate).  Drink enough fluids to keep your pee (urine) clear or pale yellow.  Drink small amounts of fluids often if you feel sick to your stomach (nauseous) or throw up (vomit).  Eat like you normally do.  Avoid:  Foods or drinks high in sugar.  Bubbly (carbonated) drinks.  Juice.  Very hot or cold fluids.  Drinks with caffeine.  Fatty, greasy foods.  Alcohol.  Tobacco.  Eating too much.  Gelatin desserts.  Wash your hands to avoid spreading germs (bacteria, viruses).  Only take medicine as told by your doctor.  Keep all doctor visits as told. GET HELP RIGHT AWAY IF:   You cannot drink something without throwing up.  You get worse even with treatment.  Your vomit has blood in it or looks greenish.  Your poop (stool) has blood in it or looks black and tarry.  You have not peed in 6 to 8 hours.  You pee a small amount of very dark pee.  You have a fever.  You pass out (faint).  You have belly (abdominal) pain that gets worse or stays in one spot (localizes).  You have a rash, stiff neck, or bad headache.  You get easily annoyed, sleepy, or are hard to wake up.  You feel weak, dizzy, or very thirsty. MAKE SURE YOU:   Understand these instructions.  Will watch your condition.  Will get help right away if you are not doing well or get worse. Document Released: 07/26/2009 Document Revised: 12/22/2011 Document Reviewed: 05/19/2011 Exit[employer] Patient Information 2014 [employer]. Diarrhea Diarrhea is watery poop (stool). It can make you feel weak, tired, thirsty, or give you a dry mouth (signs of dehydration). Watery poop is a sign of another problem, most often an infection. It often lasts 2 3 days.  It can last longer if it is a sign of something serious. Take care of yourself as told by your doctor. HOME CARE   Drink 1 cup (8 ounces) of fluid each time you have watery poop.  Do not drink the following fluids:  Those that contain simple sugars (fructose, glucose, galactose, lactose, sucrose, maltose).  Sports drinks.  Fruit juices.  Whole milk products.  Sodas.  Drinks with caffeine (coffee, tea, soda) or alcohol.  Oral rehydration solution may be used if the doctor says it is okay. You may make your own solution. Follow this recipe:    teaspoon table salt.   teaspoon baking soda.   teaspoon salt substitute containing potassium chloride.  1 tablespoons sugar.  1 liter (34 ounces) of water.  Avoid the following foods:  High fiber foods, such as raw fruits and vegetables.  Nuts, seeds, and whole grain breads and cereals.   Those that are sweetened with sugar alcohols (xylitol, sorbitol, mannitol).  Try eating the following foods:  Starchy foods, such as rice, toast, pasta, low-sugar cereal, oatmeal, baked potatoes, crackers, and bagels.  Bananas.  Applesauce.  Eat probiotic-rich foods, such as yogurt and milk products that are fermented.  Wash your hands well after each time you have watery poop.  Only take medicine as told by your doctor.  Take a warm bath to help lessen burning or pain from having watery poop. GET HELP RIGHT AWAY   IF:   You cannot drink fluids without throwing up (vomiting).  You keep throwing up.  You have blood in your poop, or your poop looks black and tarry.  You do not pee (urinate) in 6 8 hours, or there is only a small amount of very dark pee.  You have belly (abdominal) pain that gets worse or stays in the same spot (localizes).  You are weak, dizzy, confused, or lightheaded.  You have a very bad headache.  Your watery poop gets worse or does not get better.  You have a fever or lasting symptoms for more than 2 3  days.  You have a fever and your symptoms suddenly get worse. MAKE SURE YOU:   Understand these instructions.  Will watch your condition.  Will get help right away if you are not doing well or get worse. Document Released: 03/17/2008 Document Revised: 06/23/2012 Document Reviewed: 06/06/2012 [employer] Patient Information 2014 [employer].  

## 2014-01-10 NOTE — Progress Notes (Signed)
Subjective:    [name] ID: Candice [name], female    DOB: June 28, 1956, 58 y.o.   MRN: [id number]  HPI Comments: 58 yo female with diarrhea since Friday. She has had flu like symptoms/ fever. She noted mild improvement yesterday with soft stools and now back to brown watery diarrhea. She has had epigastric/ low abdomen cramping. She denies vomitus and has been pushing fluids. She has been trying to eat. She notes mild fatigue increase.   Abdominal Pain Associated symptoms include diarrhea and a fever.  Diarrhea  Associated symptoms include abdominal pain and a fever.    Current Outpatient Prescriptions on File Prior to Visit  Medication Sig Dispense Refill  . aspirin EC 81 MG tablet Take 81 mg by mouth daily.      . citalopram (CELEXA) 20 MG tablet Take 1 tablet (20 mg total) by mouth daily.  90 tablet  1  . Dapagliflozin Propanediol (FARXIGA) 5 MG TABS Take 5 mg by mouth daily.  30 tablet  3  . glucose blood test strip Use as instructed  100 each  12  . ibuprofen (ADVIL,MOTRIN) 200 MG tablet Take 200 mg by mouth every 6 (six) hours as needed for headache.      . montelukast (SINGULAIR) 10 MG tablet Take 1 tablet (10 mg total) by mouth at bedtime.  90 tablet  3   No current facility-administered medications on file prior to visit.   Allergies  Allergen Reactions  . Levaquin [Levofloxacin] Other (See Comments)    Tingling sensation to mouth, near LOC, blurred vision,    . Amoxicillin     Rash  . Eggs Or Egg-Derived Products   . Iodine   . Kombiglyze [Saxagliptin-Metformin Er]     Bloating  . Levaquin [Levofloxacin In D5w]     Hives  . Metformin And Related     Diarrhea  . Penicillins     Rash  . Shellfish Allergy   . Vitamin D Analogs     High dose Vitamin D caused diarrhea  . Latex Itching and Rash   Past Medical History  Diagnosis Date  . Diabetes mellitus without complication   . Anxiety   . Hypocholesteremia   . Hypertension   . Tobacco abuse   .  Hyperlipidemia   . Hx of colonic polyps     Fhx Colon cancer     Review of Systems  Constitutional: Positive for fever and fatigue.  Gastrointestinal: Positive for abdominal pain and diarrhea.  All other systems reviewed and are negative.   BP 92/58  Pulse 84  Temp(Src) 98 F (36.7 C) (Temporal)  Resp 18  Ht 5' 3.5" (1.613 m)  Wt 172 lb (78.019 kg)  BMI 29.99 kg/m2     Objective:   Physical Exam  Nursing note and vitals reviewed. Constitutional: She is oriented to person, place, and time. She appears well-developed and well-nourished.  HENT:  Head: Normocephalic and atraumatic.  Mouth/Throat: Oropharynx is clear and moist.  Eyes: Conjunctivae are normal.  Neck: Normal range of motion.  Cardiovascular: Normal rate, regular rhythm, normal heart sounds and intact distal pulses.   Pulmonary/Chest: Effort normal and breath sounds normal.  Abdominal: Soft. Bowel sounds are normal. She exhibits no distension. There is no tenderness.  Musculoskeletal: Normal range of motion.  Neurological: She is alert and oriented to person, place, and time.  Skin: Skin is warm and dry.  Psychiatric: She has a normal mood and affect. Judgment normal.  Assessment & Plan:  Probably Viral gastritis vs colitis- Restora 1 QD, Bland diet x 1 week, push fluids, Check labs, if any increased symptoms ER. Flagyl 500 mg tid AD. OOW x 2 days

## 2014-01-23 ENCOUNTER — Ambulatory Visit (INDEPENDENT_AMBULATORY_CARE_PROVIDER_SITE_OTHER): Payer: 59 | Admitting: Emergency Medicine

## 2014-01-23 ENCOUNTER — Encounter: Payer: Self-pay | Admitting: Emergency Medicine

## 2014-01-23 VITALS — BP 114/68 | HR 84 | Temp 98.2°F | Resp 18 | Ht 63.5 in | Wt 174.0 lb

## 2014-01-23 DIAGNOSIS — R6889 Other general symptoms and signs: Secondary | ICD-10-CM

## 2014-01-23 DIAGNOSIS — E119 Type 2 diabetes mellitus without complications: Secondary | ICD-10-CM

## 2014-01-23 DIAGNOSIS — E782 Mixed hyperlipidemia: Secondary | ICD-10-CM

## 2014-01-23 LAB — CBC WITH DIFFERENTIAL/PLATELET
BASOS PCT: 1 % (ref 0–1)
Basophils Absolute: 0.1 10*3/uL (ref 0.0–0.1)
Eosinophils Absolute: 0.1 10*3/uL (ref 0.0–0.7)
Eosinophils Relative: 2 % (ref 0–5)
HCT: 43.4 % (ref 36.0–46.0)
HEMOGLOBIN: 14.9 g/dL (ref 12.0–15.0)
LYMPHS ABS: 2.5 10*3/uL (ref 0.7–4.0)
LYMPHS PCT: 35 % (ref 12–46)
MCH: 30.5 pg (ref 26.0–34.0)
MCHC: 34.3 g/dL (ref 30.0–36.0)
MCV: 88.9 fL (ref 78.0–100.0)
MONOS PCT: 7 % (ref 3–12)
Monocytes Absolute: 0.5 10*3/uL (ref 0.1–1.0)
NEUTROS ABS: 3.9 10*3/uL (ref 1.7–7.7)
NEUTROS PCT: 55 % (ref 43–77)
Platelets: 252 10*3/uL (ref 150–400)
RBC: 4.88 MIL/uL (ref 3.87–5.11)
RDW: 13.4 % (ref 11.5–15.5)
WBC: 7 10*3/uL (ref 4.0–10.5)

## 2014-01-23 LAB — HEMOGLOBIN A1C
HEMOGLOBIN A1C: 8.3 % — AB (ref <5.7)
MEAN PLASMA GLUCOSE: 192 mg/dL — AB (ref <117)

## 2014-01-23 NOTE — Patient Instructions (Signed)
   Bad carbs also include fruit juice, alcohol, and sweet tea. These are empty calories that do not signal to your brain that you are full.   Please remember the good carbs are still carbs which convert into sugar. So please measure them out no more than 1/2-1 cup of rice, oatmeal, pasta, and beans.  Veggies are however free foods! Pile them on.   I like lean protein at every meal such as chicken, turkey, pork chops, cottage cheese, etc. Just do not fry these meats and please center your meal around vegetable, the meats should be a side dish.   No all fruit is created equal. Please see the list below, the fruit at the bottom is higher in sugars than the fruit at the top   We want weight loss that will last so you should lose 1-2 pounds a week.  THAT IS IT! Please pick THREE things a month to change. Once it is a habit check off the item. Then pick another three items off the list to become habits.  If you are already doing a habit on the list GREAT!  Cross that item off! o Don't drink your calories. Ie, alcohol, soda, fruit juice, and sweet tea.  o Drink more water. Drink a glass when you feel hungry or before each meal.  o Eat breakfast - Complex carb and protein (likeDannon light and fit yogurt, oatmeal, fruit, eggs, turkey bacon). o Measure your cereal.  Eat no more than one cup a day. (ie Kashi) o Eat an apple a day. o Add a vegetable a day. o Try a new vegetable a month. o Use Pam! Stop using oil or butter to cook. o Don't finish your plate or use smaller plates. o Share your dessert. o Eat sugar free Jello for dessert or frozen grapes. o Don't eat 2-3 hours before bed. o Switch to whole wheat bread, pasta, and brown rice. o Make healthier choices when you eat out. No fries! o Pick baked chicken, NOT fried. o Don't forget to SLOW DOWN when you eat. It is not going anywhere.  o Take the stairs. o Park far away in the parking lot o Lift soup cans (or weights) for 10 minutes while  watching TV. o Walk at work for 10 minutes during break. o Walk outside 1 time a week with your friend, kids, dog, or significant other. o Start a walking group at church. o Walk the mall as much as you can tolerate.  o Keep a food diary. o Weigh yourself daily. o Walk for 15 minutes 3 days per week. o Cook at home more often and eat out less.  If life happens and you go back to old habits, it is okay.  Just start over. You can do it!   If you experience chest pain, get short of breath, or tired during the exercise, please stop immediately and inform your doctor.   

## 2014-01-23 NOTE — Progress Notes (Signed)
Subjective:    Patient ID: Candice [name], female    DOB: July 13, 1956, 58 y.o.   MRN: [id number]  HPI Comments: 58 yo female with recent viral infection with abnormal labs, recheck appointment. She also presents for 3 month F/U for cholesterol,DM, D. Deficient. She notes BS is 140-50s. She has noticed weight stabilized with 5 mg she originally lost about 30 # with Farxiga 10 mg but was unable to tolerate. She has not d/c tobacco AD and is aware of risks associated with Diabetes/ tobacco and bladder cancer. She has failed multiple other diabetic medicines and feels the Marcelline DeistFarxiga has worked the best for her and she does not want to switch. She is trying to improve diet and increase activity.   Last labs T 271 TG 277 H 39 L 177 A1C  8.6 She was advised of increased risk and need for strict compliance with medications and life style changes.   Current Outpatient Prescriptions on File Prior to Visit  Medication Sig Dispense Refill  . aspirin EC 81 MG tablet Take 81 mg by mouth daily.      . citalopram (CELEXA) 20 MG tablet Take 1 tablet (20 mg total) by mouth daily.  90 tablet  1  . Dapagliflozin Propanediol (FARXIGA) 5 MG TABS Take 5 mg by mouth daily.  30 tablet  3  . glucose blood test strip Use as instructed  100 each  12  . ibuprofen (ADVIL,MOTRIN) 200 MG tablet Take 200 mg by mouth every 6 (six) hours as needed for headache.      . montelukast (SINGULAIR) 10 MG tablet Take 1 tablet (10 mg total) by mouth at bedtime.  90 tablet  3   No current facility-administered medications on file prior to visit.   Allergies  Allergen Reactions  . Levaquin [Levofloxacin] Other (See Comments)    Tingling sensation to mouth, near LOC, blurred vision,    . Amoxicillin     Rash  . Eggs Or Egg-Derived Products   . Iodine   . Kombiglyze [Saxagliptin-Metformin Er]     Bloating  . Levaquin [Levofloxacin In D5w]     Hives  . Metformin And Related     Diarrhea  . Penicillins     Rash  . Shellfish  Allergy   . Vitamin D Analogs     High dose Vitamin D caused diarrhea  . Latex Itching and Rash    Review of Systems  All other systems reviewed and are negative.  BP 114/68  Pulse 84  Temp(Src) 98.2 F (36.8 C) (Temporal)  Resp 18  Ht 5' 3.5" (1.613 m)  Wt 174 lb (78.926 kg)  BMI 30.34 kg/m2     Objective:   Physical Exam  Nursing note and vitals reviewed. Constitutional: She is oriented to person, place, and time. She appears well-developed and well-nourished. No distress.  overweight  HENT:  Head: Normocephalic and atraumatic.  Right Ear: External ear normal.  Left Ear: External ear normal.  Nose: Nose normal.  Mouth/Throat: Oropharynx is clear and moist.  Eyes: Conjunctivae and EOM are normal.  Neck: Normal range of motion. Neck supple. No JVD present. No thyromegaly present.  Cardiovascular: Normal rate, regular rhythm, normal heart sounds and intact distal pulses.   Pulmonary/Chest: Effort normal and breath sounds normal.  Abdominal: Soft. Bowel sounds are normal. She exhibits no distension and no mass. There is no tenderness. There is no rebound and no guarding.  Musculoskeletal: Normal range of motion. She exhibits no edema  and no tenderness.  Lymphadenopathy:    She has no cervical adenopathy.  Neurological: She is alert and oriented to person, place, and time. No cranial nerve deficit.  Skin: Skin is warm and dry. No rash noted. No erythema. No pallor.  Psychiatric: She has a normal mood and affect. Her behavior is normal. Judgment and thought content normal.          Assessment & Plan:  1. Recent viral infection with abnormal lab recheck with resolution of symptoms  2. 3 month F/U for  Cholesterol,DM, D. Deficient. Needs healthy diet, cardio QD and obtain healthy weight. Check Labs, Check BP if >130/80 call office, Check BS if >200 call office. Long discussion about compliance and risks of non-compliance.  3. Tobacco Dep- advised cessation techniques and  need for d/c to decrease Risk

## 2014-01-24 LAB — LIPID PANEL
CHOLESTEROL: 228 mg/dL — AB (ref 0–200)
HDL: 37 mg/dL — AB (ref >39)
LDL Cholesterol: 126 mg/dL — ABNORMAL HIGH (ref 0–99)
Total CHOL/HDL Ratio: 6.2 Ratio
Triglycerides: 323 mg/dL — ABNORMAL HIGH (ref <150)
VLDL: 65 mg/dL — ABNORMAL HIGH (ref 0–40)

## 2014-01-24 LAB — HEPATIC FUNCTION PANEL
ALBUMIN: 4.2 g/dL (ref 3.5–5.2)
ALK PHOS: 94 U/L (ref 39–117)
ALT: 21 U/L (ref 0–35)
AST: 13 U/L (ref 0–37)
Bilirubin, Direct: 0.1 mg/dL (ref 0.0–0.3)
Indirect Bilirubin: 0.3 mg/dL (ref 0.2–1.2)
TOTAL PROTEIN: 6.5 g/dL (ref 6.0–8.3)
Total Bilirubin: 0.4 mg/dL (ref 0.2–1.2)

## 2014-01-24 LAB — BASIC METABOLIC PANEL WITH GFR
BUN: 15 mg/dL (ref 6–23)
CO2: 22 meq/L (ref 19–32)
Calcium: 9.1 mg/dL (ref 8.4–10.5)
Chloride: 103 mEq/L (ref 96–112)
Creat: 0.72 mg/dL (ref 0.50–1.10)
GFR, Est African American: >89 mL/min
GFR, Est Non African American: >89 mL/min
GLUCOSE: 144 mg/dL — AB (ref 70–99)
POTASSIUM: 4.1 meq/L (ref 3.5–5.3)
SODIUM: 135 meq/L (ref 135–145)

## 2014-01-24 LAB — INSULIN, FASTING: Insulin fasting, serum: 14 u[IU]/mL (ref 3–28)

## 2014-01-27 ENCOUNTER — Inpatient Hospital Stay (HOSPITAL_COMMUNITY): Payer: 59

## 2014-01-27 ENCOUNTER — Inpatient Hospital Stay (HOSPITAL_COMMUNITY)
Admission: AD | Admit: 2014-01-27 | Discharge: 2014-01-27 | Disposition: A | Discharge status: Home / Self Care | Payer: 59 | Source: Ambulatory Visit | Attending: Obstetrics & Gynecology | Admitting: Obstetrics & Gynecology

## 2014-01-27 ENCOUNTER — Encounter (HOSPITAL_COMMUNITY): Payer: Self-pay | Admitting: General Practice

## 2014-01-27 DIAGNOSIS — Z800 Family history of malignant neoplasm of digestive organs: Secondary | ICD-10-CM | POA: Insufficient documentation

## 2014-01-27 DIAGNOSIS — Z860100 Personal history of colon polyps, unspecified: Secondary | ICD-10-CM | POA: Insufficient documentation

## 2014-01-27 DIAGNOSIS — R1032 Left lower quadrant pain: Secondary | ICD-10-CM

## 2014-01-27 DIAGNOSIS — E785 Hyperlipidemia, unspecified: Secondary | ICD-10-CM | POA: Insufficient documentation

## 2014-01-27 DIAGNOSIS — K5732 Diverticulitis of large intestine without perforation or abscess without bleeding: Secondary | ICD-10-CM | POA: Insufficient documentation

## 2014-01-27 DIAGNOSIS — E119 Type 2 diabetes mellitus without complications: Secondary | ICD-10-CM | POA: Insufficient documentation

## 2014-01-27 DIAGNOSIS — F17200 Nicotine dependence, unspecified, uncomplicated: Secondary | ICD-10-CM | POA: Insufficient documentation

## 2014-01-27 DIAGNOSIS — K5792 Diverticulitis of intestine, part unspecified, without perforation or abscess without bleeding: Secondary | ICD-10-CM

## 2014-01-27 DIAGNOSIS — E786 Lipoprotein deficiency: Secondary | ICD-10-CM | POA: Insufficient documentation

## 2014-01-27 LAB — CBC
HEMATOCRIT: 44.9 % (ref 36.0–46.0)
Hemoglobin: 15.7 g/dL — ABNORMAL HIGH (ref 12.0–15.0)
MCH: 32 pg (ref 26.0–34.0)
MCHC: 35 g/dL (ref 30.0–36.0)
MCV: 91.4 fL (ref 78.0–100.0)
Platelets: 201 10*3/uL (ref 150–400)
RBC: 4.91 MIL/uL (ref 3.87–5.11)
RDW: 12.7 % (ref 11.5–15.5)
WBC: 11.2 10*3/uL — AB (ref 4.0–10.5)

## 2014-01-27 LAB — URINE MICROSCOPIC-ADD ON

## 2014-01-27 LAB — BASIC METABOLIC PANEL
BUN: 15 mg/dL (ref 6–23)
CHLORIDE: 99 meq/L (ref 96–112)
CO2: 24 mEq/L (ref 19–32)
Calcium: 9.6 mg/dL (ref 8.4–10.5)
Creatinine, Ser: 0.64 mg/dL (ref 0.50–1.10)
GFR calc non Af Amer: >90 mL/min (ref >90)
Glucose, Bld: 159 mg/dL — ABNORMAL HIGH (ref 70–99)
POTASSIUM: 4.1 meq/L (ref 3.7–5.3)
Sodium: 139 mEq/L (ref 137–147)

## 2014-01-27 LAB — URINALYSIS, ROUTINE W REFLEX MICROSCOPIC
Bilirubin Urine: NEGATIVE
Hgb urine dipstick: NEGATIVE
Ketones, ur: 15 mg/dL — AB
LEUKOCYTES UA: NEGATIVE
Nitrite: NEGATIVE
PH: 5 (ref 5.0–8.0)
PROTEIN: NEGATIVE mg/dL
Specific Gravity, Urine: 1.025 (ref 1.005–1.030)
Urobilinogen, UA: 0.2 mg/dL (ref 0.0–1.0)

## 2014-01-27 NOTE — MAU Note (Signed)
Patient states she started having left lower abdominal pain that radiates to the lower abdomen last night, getting worse. States she has slight nausea, no vomiting. Denies bleeding or discharge.

## 2014-01-27 NOTE — MAU Provider Note (Signed)
History     CSN: [id number]  Arrival date and time: 01/27/14 1559   None     Chief Complaint  Patient presents with  . Abdominal Pain   HPI This is a 58 y.o. postmenopausal female who presents with c/o Left lower Quadrant pain since midnight. Has some nausea but no vomiting. No vaginal bleeding in 5 years. Denies fever. Recently had a gastroenteritis followed by "a bacterial infection in my intestines" treated with Flagyl and probiotics.   RN Note:  Patient states she started having left lower abdominal pain that radiates to the lower abdomen last night, getting worse. States she has slight nausea, no vomiting. Denies bleeding or discharge.        OB History   Grav Para Term Preterm Abortions TAB SAB Ect Mult Living                  Past Medical History  Diagnosis Date  . Diabetes mellitus without complication   . Anxiety   . Hypocholesteremia   . Tobacco abuse   . Hyperlipidemia   . Hx of colonic polyps     Fhx Colon cancer    Past Surgical History  Procedure Laterality Date  . Cesarean section    . Tonsillectomy    . Appendectomy    . Cholecystectomy      Family History  Problem Relation Age of Onset  . AAA (abdominal aortic aneurysm) Mother   . Hyperlipidemia Mother   . Heart attack Brother 55  . Pneumonia Maternal Grandmother   . Cancer Sister     Colon  . Diabetes Sister   . Heart disease Sister     History  Substance Use Topics  . Smoking status: Current Every Day Smoker -- 0.50 packs/day for 30 years    Types: Cigarettes  . Smokeless tobacco: Not on file     Comment: Just purchased smoking patches  . Alcohol Use: No    Allergies:  Allergies  Allergen Reactions  . Levaquin [Levofloxacin] Other (See Comments)    Tingling sensation to mouth, near LOC, blurred vision,    . Amoxicillin     Rash  . Eggs Or Egg-Derived Products   . Iodine   . Kombiglyze [Saxagliptin-Metformin Er]     Bloating  . Levaquin [Levofloxacin In D5w]    Hives  . Metformin And Related     Diarrhea  . Penicillins     Rash  . Shellfish Allergy   . Vitamin D Analogs     High dose Vitamin D caused diarrhea  . Latex Itching and Rash    Prescriptions prior to admission  Medication Sig Dispense Refill  . aspirin EC 81 MG tablet Take 81 mg by mouth daily.      . citalopram (CELEXA) 20 MG tablet Take 1 tablet (20 mg total) by mouth daily.  90 tablet  1  . Dapagliflozin Propanediol (FARXIGA) 5 MG TABS Take 5 mg by mouth daily.  30 tablet  3  . glucose blood test strip Use as instructed  100 each  12  . ibuprofen (ADVIL,MOTRIN) 200 MG tablet Take 200 mg by mouth every 6 (six) hours as needed for headache.      . montelukast (SINGULAIR) 10 MG tablet Take 1 tablet (10 mg total) by mouth at bedtime.  90 tablet  3    Review of Systems  Constitutional: Negative for fever, chills and malaise/fatigue.  Gastrointestinal: Positive for nausea and abdominal pain (LLQ). Negative for vomiting,  diarrhea (but did have 5 soft stools today) and constipation.  Genitourinary: Negative for dysuria.  Neurological: Negative for dizziness.   Physical Exam   Blood pressure 126/69, pulse 85, temperature 98.4 F (36.9 C), temperature source Oral, resp. rate 16, height 5\' 2"  (1.575 m), weight 77.565 kg (171 lb), SpO2 97.00%.  Physical Exam  Constitutional: She is oriented to person, place, and time. She appears well-developed and well-nourished. No distress.  HENT:  Head: Normocephalic.  Cardiovascular: Normal rate.   Respiratory: Effort normal.  GI: Soft. She exhibits no distension. There is tenderness. There is guarding (In LLQ). There is no rebound.  Genitourinary:  Declines STD testing   Musculoskeletal: Normal range of motion.  Neurological: She is alert and oriented to person, place, and time.  Skin: Skin is warm and dry.  Psychiatric: She has a normal mood and affect.    MAU Course  Procedures  MDM Discussed with Dr Mora ApplPinn. Will get UA, CBC, and  CT scan.   Ct Abdomen Pelvis Wo Contrast  01/27/2014   CLINICAL DATA:  Left lower quadrant pain. Evaluate for diverticulitis.  EXAM: CT ABDOMEN AND PELVIS WITHOUT CONTRAST  TECHNIQUE: Multidetector CT imaging of the abdomen and pelvis was performed following the standard protocol without intravenous contrast.  COMPARISON:  No priors.  FINDINGS: Lung Bases: Unremarkable.  Abdomen/Pelvis: Status post cholecystectomy. The unenhanced appearance of the liver, pancreas, spleen, bilateral adrenal glands and bilateral kidneys is unremarkable.  Several colonic diverticulae are noted, and in the region of the proximal sigmoid colon there is extensive wall thickening with surrounding inflammatory changes extending into the mesocolon, compatible with an acute diverticulitis. At this time, there is no definite well-defined abscess or gross signs of perforation.  No significant volume of ascites. No pneumoperitoneum. No pathologic distention of small bowel. No definite lymphadenopathy identified within the abdomen or pelvis on today's non contrast CT examination. Atherosclerosis throughout the abdominal and pelvic vasculature without definite aneurysm. Small umbilical hernia containing only omental fat incidentally noted. Uterus and ovaries are unremarkable in appearance. Urinary bladder is normal in appearance.  Musculoskeletal: There are no aggressive appearing lytic or blastic lesions noted in the visualized portions of the skeleton.    IMPRESSION: 1. Findings, as above, compatible with an acute diverticulitis of the proximal sigmoid colon. No definite signs of diverticular abscess or frank perforation are noted at this time. 2. Small umbilical hernia containing only omental fat. 3. Status post cholecystectomy. 4. Atherosclerosis.   Electronically Signed   By: Trudie Reedaniel  Entrikin M.D.   On: 01/27/2014 18:50    Assessment and Plan  A:  Acute Diverticulitis P:  Dr Mora ApplPinn notified      She wants her admitted to other  hospital for treatment       [facility] will call Dr Mora ApplPinn re: Inpatient vs Outpatient treatment  Candice [name] 01/27/2014, 4:30 PM   MDM 2000 - Care assumed from Wynelle BourgeoisMarie [name], CNM Dr. Mora ApplPinn in MAU to discuss outpatient treatment with the patient Dr. Mora ApplPinn discussed with patient. Will treat with Bactrim and Flagyl. Written Rx given. [id number] persist or [facility]  Freddi StarrJulie N Ethier, PA-C 01/27/2014 8:09 PM

## 2014-01-27 NOTE — Discharge Instructions (Signed)
Diverticulitis °A diverticulum is a small pouch or sac on the colon. Diverticulosis is the presence of these diverticula on the colon. Diverticulitis is the irritation (inflammation) or infection of diverticula. °CAUSES  °The colon and its diverticula contain bacteria. If food particles block the tiny opening to a diverticulum, the bacteria inside can grow and cause an increase in pressure. This leads to infection and inflammation and is called diverticulitis. °SYMPTOMS  °· Abdominal pain and tenderness. Usually, the pain is located on the left side of your abdomen. However, it could be located elsewhere. °· Fever. °· Bloating. °· Feeling sick to your stomach (nausea). °· Throwing up (vomiting). °· Abnormal stools. °DIAGNOSIS  °Your caregiver will take a history and perform a physical exam. Since many things can cause abdominal pain, other tests may be necessary. Tests may include: °· Blood tests. °· Urine tests. °· X-ray of the abdomen. °· CT scan of the abdomen. °Sometimes, surgery is needed to determine if diverticulitis or other conditions are causing your symptoms. °TREATMENT  °Most of the time, you can be treated without surgery. Treatment includes: °· Resting the bowels by only having liquids for a few days. As you improve, you will need to eat a low-fiber diet. °· Intravenous (IV) fluids if you are losing body fluids (dehydrated). °· Antibiotic medicines that treat infections may be given. °· Pain and nausea medicine, if needed. °· Surgery if the inflamed diverticulum has burst. °HOME CARE INSTRUCTIONS  °· Try a clear liquid diet (broth, tea, or water for as long as directed by your caregiver). You may then gradually begin a low-fiber diet as tolerated.  °A low-fiber diet is a diet with less than 10 grams of fiber. Choose the foods below to reduce fiber in the diet: °· White breads, cereals, rice, and pasta. °· Cooked fruits and vegetables or soft fresh fruits and vegetables without the skin. °· Ground or  well-cooked tender beef, ham, veal, lamb, pork, or poultry. °· Eggs and seafood. °· After your diverticulitis symptoms have improved, your caregiver may put you on a high-fiber diet. A high-fiber diet includes 14 grams of fiber for every 1000 calories consumed. For a standard 2000 calorie diet, you would need 28 grams of fiber. Follow these diet guidelines to help you increase the fiber in your diet. It is important to slowly increase the amount fiber in your diet to avoid gas, constipation, and bloating. °· Choose whole-grain breads, cereals, pasta, and brown rice. °· Choose fresh fruits and vegetables with the skin on. Do not overcook vegetables because the more vegetables are cooked, the more fiber is lost. °· Choose more nuts, seeds, legumes, dried peas, beans, and lentils. °· Look for food products that have greater than 3 grams of fiber per serving on the Nutrition Facts label. °· Take all medicine as directed by your caregiver. °· If your caregiver has given you a follow-up appointment, it is very important that you go. Not going could result in lasting (chronic) or permanent injury, pain, and disability. If there is any problem keeping the appointment, call to reschedule. °SEEK MEDICAL CARE IF:  °· Your pain does not improve. °· You have a hard time advancing your diet beyond clear liquids. °· Your bowel movements do not return to normal. °SEEK IMMEDIATE MEDICAL CARE IF:  °· Your pain becomes worse. °· You have an oral temperature above 102° F (38.9° C), not controlled by medicine. °· You have repeated vomiting. °· You have bloody or black, tarry stools. °·   Symptoms that brought you to your caregiver become worse or are not getting better. °MAKE SURE YOU:  °· Understand these instructions. °· Will watch your condition. °· Will get help right away if you are not doing well or get worse. °Document Released: 07/09/2005 Document Revised: 12/22/2011 Document Reviewed: 11/04/2010 °[employer]® Patient Information  ©2014 [employer]. ° °

## 2014-01-28 NOTE — MAU Provider Note (Signed)
Patient seen and evaluated in MAU.  Patient's examination c/w diagnosis of diverticulitis D[facility] with [facility] and recommendation was outpatient management with po antibiotics.  Unable to use quinolones due to patient's allergy history so looked on Up To Date and will try Bactrim / Flagyl  Patient cautioned to [facility] if patient worsens. Rx given for prn Tylenol #3  Essie HartWalda Kamdon Reisig

## 2014-03-14 ENCOUNTER — Encounter: Payer: Self-pay | Admitting: Emergency Medicine

## 2014-04-26 ENCOUNTER — Encounter: Payer: Self-pay | Admitting: Emergency Medicine

## 2014-05-04 ENCOUNTER — Other Ambulatory Visit: Payer: Self-pay | Admitting: Emergency Medicine

## 2014-05-26 ENCOUNTER — Other Ambulatory Visit: Payer: Self-pay | Admitting: Emergency Medicine

## 2014-07-03 ENCOUNTER — Other Ambulatory Visit: Payer: Self-pay

## 2014-07-03 MED ORDER — DAPAGLIFLOZIN PROPANEDIOL 5 MG PO TABS: ORAL_TABLET | ORAL | Status: DC

## 2014-07-04 ENCOUNTER — Other Ambulatory Visit: Payer: Self-pay | Admitting: Physician Assistant

## 2014-08-04 ENCOUNTER — Other Ambulatory Visit: Payer: Self-pay | Admitting: *Deleted

## 2014-08-04 ENCOUNTER — Ambulatory Visit: Payer: Self-pay | Admitting: Physician Assistant

## 2014-08-04 MED ORDER — CITALOPRAM HYDROBROMIDE 20 MG PO TABS: ORAL_TABLET | ORAL | Status: DC

## 2014-08-28 ENCOUNTER — Other Ambulatory Visit: Payer: Self-pay | Admitting: Physician Assistant

## 2014-09-01 ENCOUNTER — Encounter: Payer: Self-pay | Admitting: Emergency Medicine

## 2014-09-01 ENCOUNTER — Ambulatory Visit (INDEPENDENT_AMBULATORY_CARE_PROVIDER_SITE_OTHER): Payer: 59 | Admitting: Emergency Medicine

## 2014-09-01 VITALS — BP 118/64 | HR 92 | Temp 98.2°F | Resp 18 | Ht 63.5 in | Wt 175.0 lb

## 2014-09-01 DIAGNOSIS — E119 Type 2 diabetes mellitus without complications: Secondary | ICD-10-CM

## 2014-09-01 DIAGNOSIS — M25541 Pain in joints of right hand: Secondary | ICD-10-CM

## 2014-09-01 DIAGNOSIS — R5381 Other malaise: Secondary | ICD-10-CM

## 2014-09-01 DIAGNOSIS — M79641 Pain in right hand: Secondary | ICD-10-CM

## 2014-09-01 DIAGNOSIS — E782 Mixed hyperlipidemia: Secondary | ICD-10-CM

## 2014-09-01 NOTE — Patient Instructions (Signed)

## 2014-09-01 NOTE — Progress Notes (Signed)
Subjective:    Patient ID: Candice [name], female    DOB: 06-08-1956, 58 y.o.   MRN: [id number]  HPI Comments: 58 yo presents for 3 month F/U  Cholesterol, DM, D. Deficient. She has been more stressed with mothers recent fall and pregnant daughter. She has not been eating healthy and has not been exercising.   She will need FMLA papers for her moms care. She will be in skilled nursing with spine fracture for at least 20 days and then Alcie will need to be able to take her to f/u appointments.  She notes right hand pain/ swelling has increased x last 3 weeks without injury. She has had pain on/off in this area x over 2 years. She has had NEG autoimmune panel in the past.  Lab Results      Component                Value               Date                      WBC                      11.2*               01/27/2014                HGB                      15.7*               01/27/2014                HCT                      44.9                01/27/2014                PLT                      201                 01/27/2014                GLUCOSE                  159*                01/27/2014                CHOL                     228*                01/23/2014                TRIG                     323*                01/23/2014                HDL                      37*  01/23/2014                LDLCALC                  126*                01/23/2014                ALT                      21                  01/23/2014                AST                      13                  01/23/2014                NA                       139                 01/27/2014                K                        4.1                 01/27/2014                CL                       99                  01/27/2014                CREATININE               0.64                01/27/2014                BUN                      15                  01/27/2014                CO2                       24                  01/27/2014                HGBA1C                   8.3*                01/23/2014             Diabetes     Medication List       This list is accurate as of: 09/01/14 10:59 AM.  Always use your most recent med list.  aspirin EC 81 MG tablet  Take 81 mg by mouth daily.     citalopram 20 MG tablet  Commonly known as:  CELEXA  TAKE 1 TABLET EACH DAY.     FARXIGA 5 MG Tabs tablet  Generic drug:  dapagliflozin propanediol  TAKE 1 TABLET DAILY.     glucose blood test strip  Use as instructed     ibuprofen 200 MG tablet  Commonly known as:  ADVIL,MOTRIN  Take 200 mg by mouth every 6 (six) hours as needed for headache.     montelukast 10 MG tablet  Commonly known as:  SINGULAIR  Take 1 tablet (10 mg total) by mouth at bedtime.       Allergies  Allergen Reactions  . Levaquin [Levofloxacin] Other (See Comments)    Tingling sensation to mouth, near LOC, blurred vision,    . Amoxicillin     Rash  . Eggs Or Egg-Derived Products   . Iodine   . Kombiglyze [Saxagliptin-Metformin Er]     Bloating  . Levaquin [Levofloxacin In D5w]     Hives  . Metformin And Related     Diarrhea  . Penicillins     Rash  . Shellfish Allergy   . Vitamin D Analogs     High dose Vitamin D caused diarrhea  . Latex Itching and Rash   Past Medical History  Diagnosis Date  . Diabetes mellitus without complication   . Anxiety   . Hypocholesteremia   . Tobacco abuse   . Hyperlipidemia   . Hx of colonic polyps     Fhx Colon cancer     Review of Systems  Musculoskeletal: Positive for arthralgias.  All other systems reviewed and are negative.  BP 118/64 mmHg  Pulse 92  Temp(Src) 98.2 F (36.8 C) (Temporal)  Resp 18  Ht 5' 3.5" (1.613 m)  Wt 175 lb (79.379 kg)  BMI 30.51 kg/m2      Objective:   Physical Exam  Constitutional: She is oriented to person, place, and time. She appears well-developed and well-nourished. No distress.    HENT:  Head: Normocephalic and atraumatic.  Right Ear: External ear normal.  Left Ear: External ear normal.  Nose: Nose normal.  Mouth/Throat: Oropharynx is clear and moist.  Eyes: Conjunctivae and EOM are normal.  Neck: Normal range of motion. Neck supple. No JVD present. No thyromegaly present.  Cardiovascular: Normal rate, regular rhythm, normal heart sounds and intact distal pulses.   Pulmonary/Chest: Effort normal and breath sounds normal.  Abdominal: Soft. Bowel sounds are normal. She exhibits no distension. There is no tenderness.  Musculoskeletal: Normal range of motion. She exhibits edema and tenderness.       Hands: Lymphadenopathy:    She has no cervical adenopathy.  Neurological: She is alert and oriented to person, place, and time. No cranial nerve deficit.  Skin: Skin is warm and dry. No rash noted. No erythema. No pallor.  Psychiatric: She has a normal mood and affect. Her behavior is normal. Judgment and thought content normal.  Nursing note and vitals reviewed.       Assessment & Plan:  1.  3 month F/U for NON-Compliant Cholesterol,DM, D. Deficient. Needs healthy diet, cardio QD and obtain healthy weight. Check Labs, Check BP if >130/80 call office, Check BS if >200 call office   2. Arthralgia right hand- Ref DERM with NEG Autoimmune panel in past with increased symptoms

## 2014-09-02 LAB — LIPID PANEL
CHOL/HDL RATIO: 7.2 ratio
Cholesterol: 244 mg/dL — ABNORMAL HIGH (ref 0–200)
HDL: 34 mg/dL — ABNORMAL LOW (ref >39)
LDL Cholesterol: 145 mg/dL — ABNORMAL HIGH (ref 0–99)
Triglycerides: 324 mg/dL — ABNORMAL HIGH (ref <150)
VLDL: 65 mg/dL — AB (ref 0–40)

## 2014-09-02 LAB — BASIC METABOLIC PANEL WITH GFR
BUN: 12 mg/dL (ref 6–23)
CHLORIDE: 105 meq/L (ref 96–112)
CO2: 23 mEq/L (ref 19–32)
CREATININE: 0.71 mg/dL (ref 0.50–1.10)
Calcium: 9.2 mg/dL (ref 8.4–10.5)
Glucose, Bld: 276 mg/dL — ABNORMAL HIGH (ref 70–99)
POTASSIUM: 4.1 meq/L (ref 3.5–5.3)
Sodium: 138 mEq/L (ref 135–145)

## 2014-09-02 LAB — CBC WITH DIFFERENTIAL/PLATELET
Basophils Absolute: 0.1 10*3/uL (ref 0.0–0.1)
Basophils Relative: 1 % (ref 0–1)
EOS ABS: 0.2 10*3/uL (ref 0.0–0.7)
Eosinophils Relative: 3 % (ref 0–5)
HEMATOCRIT: 45.7 % (ref 36.0–46.0)
HEMOGLOBIN: 15.8 g/dL — AB (ref 12.0–15.0)
LYMPHS PCT: 33 % (ref 12–46)
Lymphs Abs: 1.8 10*3/uL (ref 0.7–4.0)
MCH: 31.7 pg (ref 26.0–34.0)
MCHC: 34.6 g/dL (ref 30.0–36.0)
MCV: 91.8 fL (ref 78.0–100.0)
MONOS PCT: 7 % (ref 3–12)
MPV: 10.7 fL (ref 9.4–12.4)
Monocytes Absolute: 0.4 10*3/uL (ref 0.1–1.0)
NEUTROS ABS: 3 10*3/uL (ref 1.7–7.7)
NEUTROS PCT: 56 % (ref 43–77)
Platelets: 226 10*3/uL (ref 150–400)
RBC: 4.98 MIL/uL (ref 3.87–5.11)
RDW: 13.1 % (ref 11.5–15.5)
WBC: 5.4 10*3/uL (ref 4.0–10.5)

## 2014-09-02 LAB — HEPATIC FUNCTION PANEL
ALBUMIN: 4.3 g/dL (ref 3.5–5.2)
ALT: 16 U/L (ref 0–35)
AST: 13 U/L (ref 0–37)
Alkaline Phosphatase: 92 U/L (ref 39–117)
Bilirubin, Direct: 0.1 mg/dL (ref 0.0–0.3)
Indirect Bilirubin: 0.4 mg/dL (ref 0.2–1.2)
TOTAL PROTEIN: 6.6 g/dL (ref 6.0–8.3)
Total Bilirubin: 0.5 mg/dL (ref 0.2–1.2)

## 2014-09-02 LAB — HEMOGLOBIN A1C
HEMOGLOBIN A1C: 8.5 % — AB (ref <5.7)
MEAN PLASMA GLUCOSE: 197 mg/dL — AB (ref <117)

## 2014-09-02 LAB — TSH: TSH: 0.596 u[IU]/mL (ref 0.350–4.500)

## 2014-09-02 LAB — INSULIN, FASTING: Insulin fasting, serum: 20.5 u[IU]/mL — ABNORMAL HIGH (ref 2.0–19.6)

## 2014-09-25 ENCOUNTER — Other Ambulatory Visit: Payer: Self-pay | Admitting: Physician Assistant

## 2014-09-26 ENCOUNTER — Other Ambulatory Visit: Payer: Self-pay | Admitting: Internal Medicine

## 2014-10-25 ENCOUNTER — Other Ambulatory Visit: Payer: Self-pay | Admitting: Emergency Medicine

## 2014-11-20 ENCOUNTER — Encounter: Payer: Self-pay | Admitting: Emergency Medicine

## 2014-12-21 ENCOUNTER — Ambulatory Visit (INDEPENDENT_AMBULATORY_CARE_PROVIDER_SITE_OTHER): Payer: 59 | Admitting: Internal Medicine

## 2014-12-21 ENCOUNTER — Encounter: Payer: Self-pay | Admitting: Internal Medicine

## 2014-12-21 VITALS — BP 118/74 | HR 91 | Temp 97.6°F

## 2014-12-21 DIAGNOSIS — J069 Acute upper respiratory infection, unspecified: Secondary | ICD-10-CM

## 2014-12-21 DIAGNOSIS — J029 Acute pharyngitis, unspecified: Secondary | ICD-10-CM | POA: Diagnosis not present

## 2014-12-21 DIAGNOSIS — H6503 Acute serous otitis media, bilateral: Secondary | ICD-10-CM

## 2014-12-21 LAB — INFLUENZA A AND B
INFLUENZA B AG: NEGATIVE
Inflenza A Ag: NEGATIVE

## 2014-12-21 MED ORDER — DOXYCYCLINE HYCLATE 100 MG PO TABS: 100.0000 mg | ORAL_TABLET | Freq: Two times a day (BID) | ORAL | Status: DC

## 2014-12-21 NOTE — Progress Notes (Signed)
   Subjective:    Patient ID: Candice [name], female    DOB: 08-07-1956, 59 y.o.   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 Has had respiratory congestion for several days but seems to be getting worse. Mother had a stroke yesterday and patient is concerned about exposing her to the symptoms. Patient is having myalgias and respiratory congestion. Slight sore throat.she did have influenza immunization this season.she also has a history of diabetes mellitus and hyperlipidemia    Review of Systems     Objective:   Physical Exam  Skin warm and dry. Nodes none. Pharynx slightly injected. Both TMs are slightly full and red. Neck is supple. Chest clear to auscultation. Rapid flu test is negative. Rapid strep test is negative.      Assessment & Plan:  Acute URI  Bilateral otitis media  Plan: Patient is had anaphylactic reaction to Levaquin in the past. Is able to take doxycycline. Prescribed doxycycline 100 mg twice daily for 10 days. If febrile, does not need to visit hospital. Should wear a mask while visiting the hospital.

## 2014-12-25 ENCOUNTER — Encounter: Payer: Self-pay | Admitting: Physician Assistant

## 2014-12-25 ENCOUNTER — Ambulatory Visit (INDEPENDENT_AMBULATORY_CARE_PROVIDER_SITE_OTHER): Payer: 59 | Admitting: Physician Assistant

## 2014-12-25 VITALS — BP 110/62 | HR 76 | Temp 97.7°F | Resp 16 | Ht 63.5 in | Wt 175.0 lb

## 2014-12-25 DIAGNOSIS — F411 Generalized anxiety disorder: Secondary | ICD-10-CM

## 2014-12-25 DIAGNOSIS — Z720 Tobacco use: Secondary | ICD-10-CM

## 2014-12-25 DIAGNOSIS — E782 Mixed hyperlipidemia: Secondary | ICD-10-CM

## 2014-12-25 DIAGNOSIS — Z79899 Other long term (current) drug therapy: Secondary | ICD-10-CM | POA: Insufficient documentation

## 2014-12-25 DIAGNOSIS — E119 Type 2 diabetes mellitus without complications: Secondary | ICD-10-CM

## 2014-12-25 DIAGNOSIS — E559 Vitamin D deficiency, unspecified: Secondary | ICD-10-CM | POA: Insufficient documentation

## 2014-12-25 LAB — MAGNESIUM: MAGNESIUM: 1.9 mg/dL (ref 1.5–2.5)

## 2014-12-25 LAB — BASIC METABOLIC PANEL WITH GFR
BUN: 18 mg/dL (ref 6–23)
CO2: 21 mEq/L (ref 19–32)
Calcium: 9.8 mg/dL (ref 8.4–10.5)
Chloride: 104 mEq/L (ref 96–112)
Creat: 0.79 mg/dL (ref 0.50–1.10)
GFR, EST NON AFRICAN AMERICAN: 84 mL/min
Glucose, Bld: 210 mg/dL — ABNORMAL HIGH (ref 70–99)
POTASSIUM: 4.2 meq/L (ref 3.5–5.3)
SODIUM: 139 meq/L (ref 135–145)

## 2014-12-25 LAB — HEPATIC FUNCTION PANEL
ALT: 19 U/L (ref 0–35)
AST: 17 U/L (ref 0–37)
Albumin: 4.8 g/dL (ref 3.5–5.2)
Alkaline Phosphatase: 96 U/L (ref 39–117)
BILIRUBIN TOTAL: 0.4 mg/dL (ref 0.2–1.2)
Bilirubin, Direct: 0.1 mg/dL (ref 0.0–0.3)
Indirect Bilirubin: 0.3 mg/dL (ref 0.2–1.2)
Total Protein: 7 g/dL (ref 6.0–8.3)

## 2014-12-25 LAB — CBC WITH DIFFERENTIAL/PLATELET
BASOS ABS: 0.1 10*3/uL (ref 0.0–0.1)
Basophils Relative: 1 % (ref 0–1)
Eosinophils Absolute: 0.3 10*3/uL (ref 0.0–0.7)
Eosinophils Relative: 4 % (ref 0–5)
HCT: 47.8 % — ABNORMAL HIGH (ref 36.0–46.0)
Hemoglobin: 16.6 g/dL — ABNORMAL HIGH (ref 12.0–15.0)
Lymphocytes Relative: 37 % (ref 12–46)
Lymphs Abs: 2.5 10*3/uL (ref 0.7–4.0)
MCH: 31.9 pg (ref 26.0–34.0)
MCHC: 34.7 g/dL (ref 30.0–36.0)
MCV: 91.7 fL (ref 78.0–100.0)
MONOS PCT: 6 % (ref 3–12)
MPV: 10.5 fL (ref 8.6–12.4)
Monocytes Absolute: 0.4 10*3/uL (ref 0.1–1.0)
NEUTROS PCT: 52 % (ref 43–77)
Neutro Abs: 3.5 10*3/uL (ref 1.7–7.7)
PLATELETS: 217 10*3/uL (ref 150–400)
RBC: 5.21 MIL/uL — ABNORMAL HIGH (ref 3.87–5.11)
RDW: 13.4 % (ref 11.5–15.5)
WBC: 6.7 10*3/uL (ref 4.0–10.5)

## 2014-12-25 LAB — LIPID PANEL
CHOLESTEROL: 284 mg/dL — AB (ref 0–200)
HDL: 32 mg/dL — ABNORMAL LOW (ref >=46)
TRIGLYCERIDES: 572 mg/dL — AB (ref <150)
Total CHOL/HDL Ratio: 8.9 Ratio

## 2014-12-25 LAB — TSH: TSH: 0.706 u[IU]/mL (ref 0.350–4.500)

## 2014-12-25 MED ORDER — ROSUVASTATIN CALCIUM 20 MG PO TABS: 20.0000 mg | ORAL_TABLET | Freq: Every day | ORAL | Status: DC

## 2014-12-25 NOTE — Progress Notes (Signed)
Assessment and Plan:  1. Hypertension -Continue medication, monitor blood pressure at home. Continue DASH diet.  Reminder to go to the ER if any CP, SOB, nausea, dizziness, severe HA, changes vision/speech, left arm numbness and tingling and jaw pain.  2. Cholesterol -Continue diet and exercise. Check cholesterol.  Will try crestor 20mg  start on 1/2 pill 3 days a week, recheck LFTs and chol 6 weeks lab only  3. Diabetes without complications -Continue diet and exercise. Check A1C  4. Vitamin D Def - check level and continue medications.   5. Smoking cessation-  - instruction/counseling given, counseled patient on the dangers of tobacco use, advised patient to stop smoking, and reviewed strategies to maximize success, patient ready to quit at this time.   Continue diet and meds as discussed. Further disposition pending results of labs. Discussed med's effects and SE's.    HPI 59 y.o. female  presents for 3 month follow up  has a past medical history of Diabetes mellitus without complication; Anxiety; Hypocholesteremia; Tobacco abuse; Hyperlipidemia; and colonic polyps.  Her blood pressure has been controlled at home, today her BP is BP: 110/62 mmHg.  She does not workout. She denies chest pain, shortness of breath, dizziness.  She is not on cholesterol medication and denies myalgias. Her cholesterol is not at goal. The cholesterol was:   Lab Results  Component Value Date   CHOL 244* 09/01/2014   HDL 34* 09/01/2014   LDLCALC 145* 09/01/2014   TRIG 324* 09/01/2014   CHOLHDL 7.2 09/01/2014    She has been working on diet and exercise for diabetes without complications, she is on bASA, she is not on ACE/ARB due to hypotension, and denies  paresthesia of the feet, polydipsia, polyuria and visual disturbances. Last A1C was:  Lab Results  Component Value Date   HGBA1C 8.5* 09/01/2014   Patient is on Vitamin D supplement. She is a smoker but has patches at home and would like to quit  smoking, she has a new grandbaby and would like to quit for her.   She is on celexa for anxiety.  She has allergies, is on claritin and singulair, had a cold and is being treated with Doxy from Dr. At work.   Current Medications:  Current Outpatient Prescriptions on File Prior to Visit  Medication Sig Dispense Refill  . aspirin EC 81 MG tablet Take 81 mg by mouth daily.    . citalopram (CELEXA) 20 MG tablet TAKE 1 TABLET EACH DAY. 30 tablet 3  . doxycycline (VIBRA-TABS) 100 MG tablet Take 1 tablet (100 mg total) by mouth 2 (two) times daily. 20 tablet 0  . FARXIGA 5 MG TABS tablet TAKE 1 TABLET DAILY. 30 tablet 3  . glucose blood test strip Use as instructed 100 each 12  . ibuprofen (ADVIL,MOTRIN) 200 MG tablet Take 200 mg by mouth every 6 (six) hours as needed for headache.    . montelukast (SINGULAIR) 10 MG tablet TAKE ONE TABLET AT BEDTIME. 30 tablet 2   No current facility-administered medications on file prior to visit.   Medical History:  Past Medical History  Diagnosis Date  . Diabetes mellitus without complication   . Anxiety   . Hypocholesteremia   . Tobacco abuse   . Hyperlipidemia   . Hx of colonic polyps     Fhx Colon cancer   Allergies:  Allergies  Allergen Reactions  . Levaquin [Levofloxacin] Other (See Comments)    Tingling sensation to mouth, near LOC, blurred vision,    .  Amoxicillin     Rash  . Eggs Or Egg-Derived Products   . Iodine   . Kombiglyze [Saxagliptin-Metformin Er]     Bloating  . Levaquin [Levofloxacin In D5w]     Hives  . Metformin And Related     Diarrhea  . Penicillins     Rash  . Shellfish Allergy   . Vitamin D Analogs     High dose Vitamin D caused diarrhea  . Latex Itching and Rash    Review of Systems:  Review of Systems  Constitutional: Negative.   HENT: Positive for congestion.   Eyes: Negative.   Respiratory: Negative.   Cardiovascular: Negative.   Gastrointestinal: Positive for diarrhea (from ABX).  Genitourinary:  Negative.   Musculoskeletal: Negative.   Skin: Negative.   Neurological: Negative.   Endo/Heme/Allergies: Negative.   Psychiatric/Behavioral: Negative.     Family history- Review and unchanged Social history- Review and unchanged Physical Exam: BP 110/62 mmHg  Pulse 76  Temp(Src) 97.7 F (36.5 C)  Resp 16  Ht 5' 3.5" (1.613 m)  Wt 175 lb (79.379 kg)  BMI 30.51 kg/m2 Wt Readings from Last 3 Encounters:  12/25/14 175 lb (79.379 kg)  09/01/14 175 lb (79.379 kg)  01/27/14 171 lb (77.565 kg)   General Appearance: Well nourished, in no apparent distress. Eyes: PERRLA, EOMs, conjunctiva no swelling or erythema Sinuses: No Frontal/maxillary tenderness ENT/Mouth: Ext aud canals clear, TMs without erythema, bulging. No erythema, swelling, or exudate on post pharynx.  Tonsils not swollen or erythematous. Hearing normal.  Neck: Supple, thyroid normal.  Respiratory: Respiratory effort normal, BS equal bilaterally without rales, rhonchi, wheezing or stridor.  Cardio: RRR with no MRGs. Brisk peripheral pulses without edema.  Abdomen: Soft, + BS.  Non tender, no guarding, rebound, hernias, masses. Lymphatics: Non tender without lymphadenopathy.  Musculoskeletal: Full ROM, 5/5 strength, Normal gait Skin: Warm, dry without rashes, lesions, ecchymosis.  Neuro: Cranial nerves intact. No cerebellar symptoms.  Psych: Awake and oriented X 3, normal affect, Insight and Judgment appropriate.    Quentin Mulling, PA-C 3:23 PM [facility]

## 2014-12-25 NOTE — Patient Instructions (Addendum)
Your ears and sinuses are connected by the eustachian tube. When your sinuses are inflamed, this can close off the tube and cause fluid to collect in your middle ear. This can then cause dizziness, popping, clicking, ringing, and echoing in your ears. This is often NOT an infection and does NOT require antibiotics, it is caused by inflammation so the treatments help the inflammation. This can take a long time to get better so please be patient.  Here are things you can do to help with this: - Try the Flonase or Nasonex. Remember to spray each nostril twice towards the outer part of your eye.  Do not sniff but instead pinch your nose and tilt your head back to help the medicine get into your sinuses.  The best time to do this is at bedtime.Stop if you get blurred vision or nose bleeds.  -While drinking fluids, pinch and hold nose close and swallow, to help open eustachian tubes to drain fluid behind ear drums. -Please pick one of the over the counter allergy medications below and take it once daily for allergies.  It will also help with fluid behind ear drums. Claritin or loratadine cheapest but likely the weakest  Zyrtec or certizine at night because it can make you sleepy The strongest is allegra or fexafinadine  Cheapest at [facility], [employer], [employer] -can use decongestant over the counter, please do not use if you have high blood pressure or certain heart conditions.   if worsening HA, changes vision/speech, imbalance, weakness go to the ER    Get on crestor will do  start 1/2 pill every other day or 3 days a week.   Diabetes is a very complicated disease...lets simplify it.  An easy way to look at it to understand the complications is if you think of the extra sugar floating in your blood stream as glass shards floating through your blood stream.    Diabetes affects your small vessels first: 1) The glass shards (sugar) scraps down the tiny blood vessels in your eyes and lead to diabetic  retinopathy, the leading cause of blindness in the [place]. Diabetes is the leading cause of newly diagnosed adult (41 to 57 years of age) blindness in the [place].  2) The glass shards scratches down the tiny vessels of your legs leading to nerve damage called neuropathy and can lead to amputations of your feet. More than 60% of all non-traumatic amputations of lower limbs occur in people with diabetes.  3) Over time the small vessels in your brain are shredded and closed off, individually this does not cause any problems but over a long period of time many of the small vessels being blocked can lead to Vascular Dementia.   4) Your kidney's are a filter system and have a "net" that keeps certain things in the body and lets bad things out. Sugar shreds this net and leads to kidney damage and eventually failure. Decreasing the sugar that is destroying the net and certain blood pressure medications can help stop or decrease progression of kidney disease. Diabetes was the primary cause of kidney failure in 44 percent of all new cases in 2011.  5) Diabetes also destroys the small vessels in your penis that lead to erectile dysfunction. Eventually the vessels are so damaged that you may not be responsive to cialis or viagra.   Diabetes and your large vessels: Your larger vessels consist of your coronary arteries in your heart and the carotid vessels to your brain. Diabetes  or even increased sugars put you at 300% increased risk of heart attack and stroke and this is why.. The sugar scrapes down your large blood vessels and your body sees this as an internal injury and tries to repair itself. Just like you get a scab on your skin, your platelets will stick to the blood vessel wall trying to heal it. This is why we have diabetics on low dose aspirin daily, this prevents the platelets from sticking and can prevent plaque formation. In addition, your body takes cholesterol and tries to shove it into the open  wound. This is why we want your LDL, or bad cholesterol, below 70.   The combination of platelets and cholesterol over 5-10 years forms plaque that can break off and cause a heart attack or stroke.   PLEASE REMEMBER:  Diabetes is preventable! Up to 85 percent of complications and morbidities among individuals with type 2 diabetes can be prevented, delayed, or effectively treated and minimized with regular visits to a health professional, appropriate monitoring and medication, and a healthy diet and lifestyle.     Bad carbs also include fruit juice, alcohol, and sweet tea. These are empty calories that do not signal to your brain that you are full.   Please remember the good carbs are still carbs which convert into sugar. So please measure them out no more than 1/2-1 cup of rice, oatmeal, pasta, and beans  Veggies are however free foods! Pile them on.   Not all fruit is created equal. Please see the list below, the fruit at the bottom is higher in sugars than the fruit at the top. Please avoid all dried fruits.     We want weight loss that will last so you should lose 1-2 pounds a week.  THAT IS IT! Please pick THREE things a month to change. Once it is a habit check off the item. Then pick another three items off the list to become habits.  If you are already doing a habit on the list GREAT!  Cross that item off! o Don't drink your calories. Ie, alcohol, soda, fruit juice, and sweet tea.  o Drink more water. Drink a glass when you feel hungry or before each meal.  o Eat breakfast - Complex carb and protein (likeDannon light and fit yogurt, oatmeal, fruit, eggs, [place] bacon). o Measure your cereal.  Eat no more than one cup a day. (ie [place]) o Eat an apple a day. o Add a vegetable a day. o Try a new vegetable a month. o Use Pam! Stop using oil or butter to cook. o Don't finish your plate or use smaller plates. o Share your dessert. o Eat sugar free Jello for dessert or frozen  grapes. o Don't eat 2-3 hours before bed. o Switch to whole wheat bread, pasta, and brown rice. o Make healthier choices when you eat out. No fries! o Pick baked chicken, NOT fried. o Don't forget to SLOW DOWN when you eat. It is not going anywhere.  o Take the stairs. o Park far away in the parking lot o [employer] (or weights) for 10 minutes while watching TV. o Walk at work for 10 minutes during break. o Walk outside 1 time a week with your friend, kids, dog, or significant other. o Start a walking group at church. o Walk the mall as much as you can tolerate.  o Keep a food diary. o Weigh yourself daily. o Walk for 15 minutes 3 days  per week. o Cook at home more often and eat out less.  If life happens and you go back to old habits, it is okay.  Just start over. You can do it!   If you experience chest pain, get short of breath, or tired during the exercise, please stop immediately and inform your doctor.   Before you even begin to attack a weight-loss plan, it pays to remember this: You are not fat. You have fat. Losing weight isn't about blame or shame; it's simply another achievement to accomplish. Dieting is like any other skill-you have to buckle down and work at it. As long as you act in a smart, reasonable way, you'll ultimately get where you want to be. Here are some weight loss pearls for you.  1. It's Not a Diet. It's a Lifestyle Thinking of a diet as something you're on and suffering through only for the short term doesn't work. To shed weight and keep it off, you need to make permanent changes to the way you eat. It's OK to indulge occasionally, of course, but if you cut calories temporarily and then revert to your old way of eating, you'll gain back the weight quicker than you can say yo-yo. Use it to lose it. Research shows that one of the best predictors of long-term weight loss is how many pounds you drop in the first month. For that reason, nutritionists often suggest  being stricter for the first two weeks of your new eating strategy to build momentum. Cut out added sugar and alcohol and avoid unrefined carbs. After that, figure out how you can reincorporate them in a way that's healthy and maintainable.  2. There's a Right Way to Exercise Working out burns calories and fat and boosts your metabolism by building muscle. But those trying to lose weight are notorious for overestimating the number of calories they burn and underestimating the amount they take in. Unfortunately, your system is biologically programmed to hold on to extra pounds and that means when you start exercising, your body senses the deficit and ramps up its hunger signals. If you're not diligent, you'll eat everything you burn and then some. Use it to lose it. Cardio gets all the exercise glory, but strength and interval training are the real heroes. They help you build lean muscle, which in turn increases your metabolism and calorie-burning ability 3. Don't Overreact to Mild Hunger Some people have a hard time losing weight because of hunger anxiety. To them, being hungry is bad-something to be avoided at all costs-so they carry snacks with them and eat when they don't need to. Others eat because they're stressed out or bored. While you never want to get to the point of being ravenous (that's when bingeing is likely to happen), a hunger pang, a craving, or the fact that it's 3:00 p.m. should not send you racing for the vending machine or obsessing about the energy bar in your purse. Ideally, you should put off eating until your stomach is growling and it's difficult to concentrate.  Use it to lose it. When you feel the urge to eat, use the HALT method. Ask yourself, Am I really hungry? Or am I angry or anxious, lonely or bored, or tired? If you're still not certain, try the apple test. If you're truly hungry, an apple should seem delicious; if it doesn't, something else is going on. Or you can try  drinking water and making yourself busy, if you are still hungry try a  healthy snack.  4. Not All Calories Are Created Equal The mechanics of weight loss are pretty simple: Take in fewer calories than you use for energy. But the kind of food you eat makes all the difference. Processed food that's high in saturated fat and refined starch or sugar can cause inflammation that disrupts the hormone signals that tell your brain you're full. The result: You eat a lot more.  Use it to lose it. Clean up your diet. Swap in whole, unprocessed foods, including vegetables, lean protein, and healthy fats that will fill you up and give you the biggest nutritional bang for your calorie buck. In a few weeks, as your brain starts receiving regular hunger and fullness signals once again, you'll notice that you feel less hungry overall and naturally start cutting back on the amount you eat.  5. Protein, Produce, and Plant-Based Fats Are Your Weight-Loss Trinity Here's why eating the three Ps regularly will help you drop pounds. Protein fills you up. You need it to build lean muscle, which keeps your metabolism humming so that you can torch more fat. People in a weight-loss program who ate double the recommended daily allowance for protein (about 110 grams for a 150-pound woman) lost 70 percent of their weight from fat, while people who ate the RDA lost only about 40 percent, one study found. Produce is packed with filling fiber. "It's very difficult to consume too many calories if you're eating a lot of vegetables. Example: Three cups of broccoli is a lot of food, yet only 93 calories. (Fruit is another story. It can be easy to overeat and can contain a lot of calories from sugar, so be sure to monitor your intake.) Plant-based fats like olive oil and those in avocados and nuts are healthy and extra satiating.  Use it to lose it. Aim to incorporate each of the three Ps into every meal and snack. People who eat protein  throughout the day are able to keep weight off, according to a study in the American Journal of Clinical Nutrition. In addition to meat, poultry and seafood, good sources are beans, lentils, eggs, tofu, and yogurt. As for fat, keep portion sizes in check by measuring out salad dressing, oil, and nut butters (shoot for one to two tablespoons). Finally, eat veggies or a little fruit at every meal. People who did that consumed 308 fewer calories but didn't feel any hungrier than when they didn't eat more produce.  7. How You Eat Is As Important As What You Eat In order for your brain to register that you're full, you need to focus on what you're eating. Sit down whenever you eat, preferably at a table. Turn off the TV or computer, put down your phone, and look at your food. Smell it. Chew slowly, and don't put another bite on your fork until you swallow. When women ate lunch this attentively, they consumed 30 percent less when snacking later than those who listened to an audiobook at lunchtime, according to a study in the [place] Journal of Nutrition. 8. Weighing Yourself Really Works The scale provides the best evidence about whether your efforts are paying off. Seeing the numbers tick up or down or stagnate is motivation to keep going-or to rethink your approach. [facility] found that daily weigh-ins helped people lose more weight, keep it off, and maintain that loss, even after two years. Use it to lose it. Step on the scale at the same time every  day for the best results. If your weight shoots up several pounds from one weigh-in to the next, don't freak out. Eating a lot of salt the night before or having your period is the likely culprit. The number should return to normal in a day or two. It's a steady climb that you need to do something about. 9. Too Much Stress and Too Little Sleep Are Your Enemies When you're tired and frazzled, your body cranks up the production of cortisol, the  stress hormone that can cause carb cravings. Not getting enough sleep also boosts your levels of ghrelin, a hormone associated with hunger, while suppressing leptin, a hormone that signals fullness and satiety. People on a diet who slept only five and a half hours a night for two weeks lost 55 percent less fat and were hungrier than those who slept eight and a half hours, according to a study in the [place] [employer] Journal. Use it to lose it. Prioritize sleep, aiming for seven hours or more a night, which research shows helps lower stress. And make sure you're getting quality zzz's. If a snoring spouse or a fidgety cat wakes you up frequently throughout the night, you may end up getting the equivalent of just four hours of sleep, according to a study from [facility]. Keep pets out of the bedroom, and use a white-noise app to drown out snoring. 10. You Will Hit a plateau-And You Can Bust Through It As you slim down, your body releases much less leptin, the fullness hormone.  If you're not strength training, start right now. Building muscle can raise your metabolism to help you overcome a plateau. To keep your body challenged and burning calories, incorporate new moves and more intense intervals into your workouts or add another sweat session to your weekly routine. Alternatively, cut an extra 100 calories or so a day from your diet. Now that you've lost weight, your body simply doesn't need as much fuel.

## 2014-12-26 LAB — VITAMIN D 25 HYDROXY (VIT D DEFICIENCY, FRACTURES): Vit D, 25-Hydroxy: 10 ng/mL — ABNORMAL LOW (ref 30–100)

## 2014-12-26 LAB — HEMOGLOBIN A1C
Hgb A1c MFr Bld: 8.7 % — ABNORMAL HIGH (ref <5.7)
MEAN PLASMA GLUCOSE: 203 mg/dL — AB (ref <117)

## 2015-01-04 ENCOUNTER — Emergency Department (HOSPITAL_COMMUNITY)
Admission: EM | Admit: 2015-01-04 | Discharge: 2015-01-04 | Disposition: A | Discharge status: Home / Self Care | Payer: 59 | Attending: Emergency Medicine | Admitting: Emergency Medicine

## 2015-01-04 ENCOUNTER — Encounter (HOSPITAL_COMMUNITY): Payer: Self-pay | Admitting: Emergency Medicine

## 2015-01-04 DIAGNOSIS — Z880 Allergy status to penicillin: Secondary | ICD-10-CM | POA: Diagnosis not present

## 2015-01-04 DIAGNOSIS — Z9889 Other specified postprocedural states: Secondary | ICD-10-CM | POA: Diagnosis not present

## 2015-01-04 DIAGNOSIS — Z91040 Latex allergy status: Secondary | ICD-10-CM | POA: Diagnosis not present

## 2015-01-04 DIAGNOSIS — Z9049 Acquired absence of other specified parts of digestive tract: Secondary | ICD-10-CM | POA: Diagnosis not present

## 2015-01-04 DIAGNOSIS — E119 Type 2 diabetes mellitus without complications: Secondary | ICD-10-CM | POA: Diagnosis not present

## 2015-01-04 DIAGNOSIS — R509 Fever, unspecified: Secondary | ICD-10-CM | POA: Diagnosis not present

## 2015-01-04 DIAGNOSIS — R112 Nausea with vomiting, unspecified: Secondary | ICD-10-CM | POA: Insufficient documentation

## 2015-01-04 DIAGNOSIS — E786 Lipoprotein deficiency: Secondary | ICD-10-CM | POA: Insufficient documentation

## 2015-01-04 DIAGNOSIS — Z7982 Long term (current) use of aspirin: Secondary | ICD-10-CM | POA: Diagnosis not present

## 2015-01-04 DIAGNOSIS — Z720 Tobacco use: Secondary | ICD-10-CM | POA: Diagnosis not present

## 2015-01-04 DIAGNOSIS — F419 Anxiety disorder, unspecified: Secondary | ICD-10-CM | POA: Diagnosis not present

## 2015-01-04 DIAGNOSIS — R109 Unspecified abdominal pain: Secondary | ICD-10-CM | POA: Insufficient documentation

## 2015-01-04 DIAGNOSIS — Z79899 Other long term (current) drug therapy: Secondary | ICD-10-CM | POA: Insufficient documentation

## 2015-01-04 DIAGNOSIS — Z86010 Personal history of colonic polyps: Secondary | ICD-10-CM | POA: Insufficient documentation

## 2015-01-04 DIAGNOSIS — R51 Headache: Secondary | ICD-10-CM | POA: Insufficient documentation

## 2015-01-04 LAB — CBC WITH DIFFERENTIAL/PLATELET
Basophils Absolute: 0 10*3/uL (ref 0.0–0.1)
Basophils Relative: 0 % (ref 0–1)
EOS ABS: 0.1 10*3/uL (ref 0.0–0.7)
EOS PCT: 0 % (ref 0–5)
HCT: 47.7 % — ABNORMAL HIGH (ref 36.0–46.0)
Hemoglobin: 16.6 g/dL — ABNORMAL HIGH (ref 12.0–15.0)
LYMPHS ABS: 0.2 10*3/uL — AB (ref 0.7–4.0)
LYMPHS PCT: 2 % — AB (ref 12–46)
MCH: 32.2 pg (ref 26.0–34.0)
MCHC: 34.8 g/dL (ref 30.0–36.0)
MCV: 92.4 fL (ref 78.0–100.0)
Monocytes Absolute: 0.2 10*3/uL (ref 0.1–1.0)
Monocytes Relative: 2 % — ABNORMAL LOW (ref 3–12)
NEUTROS PCT: 96 % — AB (ref 43–77)
Neutro Abs: 12.8 10*3/uL — ABNORMAL HIGH (ref 1.7–7.7)
Platelets: 175 10*3/uL (ref 150–400)
RBC: 5.16 MIL/uL — AB (ref 3.87–5.11)
RDW: 12.2 % (ref 11.5–15.5)
WBC: 13.3 10*3/uL — ABNORMAL HIGH (ref 4.0–10.5)

## 2015-01-04 LAB — COMPREHENSIVE METABOLIC PANEL
ALK PHOS: 89 U/L (ref 39–117)
ALT: 23 U/L (ref 0–35)
AST: 23 U/L (ref 0–37)
Albumin: 4.7 g/dL (ref 3.5–5.2)
Anion gap: 16 — ABNORMAL HIGH (ref 5–15)
BILIRUBIN TOTAL: 1.6 mg/dL — AB (ref 0.3–1.2)
BUN: 22 mg/dL (ref 6–23)
CO2: 21 mmol/L (ref 19–32)
Calcium: 9 mg/dL (ref 8.4–10.5)
Chloride: 102 mmol/L (ref 96–112)
Creatinine, Ser: 0.79 mg/dL (ref 0.50–1.10)
GFR calc non Af Amer: >90 mL/min (ref >90)
Glucose, Bld: 273 mg/dL — ABNORMAL HIGH (ref 70–99)
POTASSIUM: 4 mmol/L (ref 3.5–5.1)
SODIUM: 139 mmol/L (ref 135–145)
TOTAL PROTEIN: 7.9 g/dL (ref 6.0–8.3)

## 2015-01-04 LAB — LIPASE, BLOOD: LIPASE: 17 U/L (ref 11–59)

## 2015-01-04 MED ORDER — ONDANSETRON HCL 4 MG/2ML IJ SOLN
4.0000 mg | Freq: Once | INTRAMUSCULAR | Status: AC
  Administered 2015-01-04: 4 mg via INTRAVENOUS
  Filled 2015-01-04: qty 2

## 2015-01-04 MED ORDER — DICYCLOMINE HCL 20 MG PO TABS: 20.0000 mg | ORAL_TABLET | Freq: Two times a day (BID) | ORAL | Status: DC

## 2015-01-04 MED ORDER — KETOROLAC TROMETHAMINE 30 MG/ML IJ SOLN
30.0000 mg | Freq: Once | INTRAMUSCULAR | Status: DC
  Filled 2015-01-04: qty 1

## 2015-01-04 MED ORDER — SODIUM CHLORIDE 0.9 % IV BOLUS (SEPSIS)
1000.0000 mL | Freq: Once | INTRAVENOUS | Status: AC
  Administered 2015-01-04: 1000 mL via INTRAVENOUS

## 2015-01-04 MED ORDER — ACETAMINOPHEN 500 MG PO TABS
500.0000 mg | ORAL_TABLET | Freq: Once | ORAL | Status: AC
  Administered 2015-01-04: 500 mg via ORAL
  Filled 2015-01-04: qty 1

## 2015-01-04 MED ORDER — ONDANSETRON 4 MG PO TBDP: 4.0000 mg | ORAL_TABLET | Freq: Three times a day (TID) | ORAL | Status: DC | PRN

## 2015-01-04 MED ORDER — DICYCLOMINE HCL 10 MG PO CAPS
10.0000 mg | ORAL_CAPSULE | Freq: Once | ORAL | Status: AC
  Administered 2015-01-04: 10 mg via ORAL
  Filled 2015-01-04: qty 1

## 2015-01-04 NOTE — Discharge Instructions (Signed)
Nausea and Vomiting  Nausea is a sick feeling that often comes before throwing up (vomiting). Vomiting is a reflex where stomach contents come out of your mouth. Vomiting can cause severe loss of body fluids (dehydration). Children and elderly adults can become dehydrated quickly, especially if they also have diarrhea. Nausea and vomiting are symptoms of a condition or disease. It is important to find the cause of your symptoms.  CAUSES    Direct irritation of the stomach lining. This irritation can result from increased acid production (gastroesophageal reflux disease), infection, food poisoning, taking certain medicines (such as nonsteroidal anti-inflammatory drugs), alcohol use, or tobacco use.   Signals from the brain.These signals could be caused by a headache, heat exposure, an inner ear disturbance, increased pressure in the brain from injury, infection, a tumor, or a concussion, pain, emotional stimulus, or metabolic problems.   An obstruction in the gastrointestinal tract (bowel obstruction).   Illnesses such as diabetes, hepatitis, gallbladder problems, appendicitis, kidney problems, cancer, sepsis, atypical symptoms of a heart attack, or eating disorders.   Medical treatments such as chemotherapy and radiation.   Receiving medicine that makes you sleep (general anesthetic) during surgery.  DIAGNOSIS  Your caregiver may ask for tests to be done if the problems do not improve after a few days. Tests may also be done if symptoms are severe or if the reason for the nausea and vomiting is not clear. Tests may include:   Urine tests.   Blood tests.   Stool tests.   Cultures (to look for evidence of infection).   X-rays or other imaging studies.  Test results can help your caregiver make decisions about treatment or the need for additional tests.  TREATMENT  You need to stay well hydrated. Drink frequently but in small amounts.You may wish to drink water, sports drinks, clear broth, or eat frozen  ice pops or gelatin dessert to help stay hydrated.When you eat, eating slowly may help prevent nausea.There are also some antinausea medicines that may help prevent nausea.  HOME CARE INSTRUCTIONS    Take all medicine as directed by your caregiver.   If you do not have an appetite, do not force yourself to eat. However, you must continue to drink fluids.   If you have an appetite, eat a normal diet unless your caregiver tells you differently.   Eat a variety of complex carbohydrates (rice, wheat, potatoes, bread), lean meats, yogurt, fruits, and vegetables.   Avoid high-fat foods because they are more difficult to digest.   Drink enough water and fluids to keep your urine clear or pale yellow.   If you are dehydrated, ask your caregiver for specific rehydration instructions. Signs of dehydration may include:   Severe thirst.   Dry lips and mouth.   Dizziness.   Dark urine.   Decreasing urine frequency and amount.   Confusion.   Rapid breathing or pulse.  SEEK IMMEDIATE MEDICAL CARE IF:    You have blood or brown flecks (like coffee grounds) in your vomit.   You have black or bloody stools.   You have a severe headache or stiff neck.   You are confused.   You have severe abdominal pain.   You have chest pain or trouble breathing.   You do not urinate at least once every 8 hours.   You develop cold or clammy skin.   You continue to vomit for longer than 24 to 48 hours.   You have a fever.  MAKE SURE YOU:      Understand these instructions.   Will watch your condition.   Will get help right away if you are not doing well or get worse.  Document Released: 09/29/2005 Document Revised: 12/22/2011 Document Reviewed: 02/26/2011  ExitCare Patient Information 2015 [employer]. This information is not intended to replace advice given to you by your health care provider. Make sure you discuss any questions you have with your health care provider.      Abdominal Pain  Many things can cause  abdominal pain. Usually, abdominal pain is not caused by a disease and will improve without treatment. It can often be observed and treated at home. Your health care provider will do a physical exam and possibly order blood tests and X-rays to help determine the seriousness of your pain. However, in many cases, more time must pass before a clear cause of the pain can be found. Before that point, your health care provider may not know if you need more testing or further treatment.  HOME CARE INSTRUCTIONS   Monitor your abdominal pain for any changes. The following actions may help to alleviate any discomfort you are experiencing:   Only take over-the-counter or prescription medicines as directed by your health care provider.   Do not take laxatives unless directed to do so by your health care provider.   Try a clear liquid diet (broth, tea, or water) as directed by your health care provider. Slowly move to a bland diet as tolerated.  SEEK MEDICAL CARE IF:   You have unexplained abdominal pain.   You have abdominal pain associated with nausea or diarrhea.   You have pain when you urinate or have a bowel movement.   You experience abdominal pain that wakes you in the night.   You have abdominal pain that is worsened or improved by eating food.   You have abdominal pain that is worsened with eating fatty foods.   You have a fever.  SEEK IMMEDIATE MEDICAL CARE IF:    Your pain does not go away within 2 hours.   You keep throwing up (vomiting).   Your pain is felt only in portions of the abdomen, such as the right side or the left lower portion of the abdomen.   You pass bloody or black tarry stools.  MAKE SURE YOU:   Understand these instructions.    Will watch your condition.    Will get help right away if you are not doing well or get worse.   Document Released: 07/09/2005 Document Revised: 10/04/2013 Document Reviewed: 06/08/2013  Exit[employer] Patient Information 2015 [employer]. This information  is not intended to replace advice given to you by your health care provider. Make sure you discuss any questions you have with your health care provider.

## 2015-01-04 NOTE — ED Provider Notes (Signed)
CSN: [id number]     Arrival date & time 01/04/15  [id number] History   First MD Initiated Contact with Patient 01/04/15 [phone]     Chief Complaint  Patient presents with  . Emesis  . Abdominal Pain   Candice [name] is a 59 y.o. female  With history diabetes, previous appendectomy and cholecystectomy who presents to the emergency department complaining of mid abdominal pain and vomiting for the past 9 hours.  Patient reports that her husband is also having some gastric distress after they both ate spaghetti at a restaurant. She also reports her son is sick at home with GI illness.  The patient is complaining of midabdominal pain associated with vomiting and nausea. She rates her mid abdominal pain at 3 out of 10.  She reports vomiting 4 times. Her last vomiting was yesterday and was normal. She reports associated body aches and headache. She reports subjective fever. She has taking nothing for treatment today. Previous abdominal surgeries include an appendectomy, cholecystectomy and C-section.  The patient denies hematemesis, hematochezia, diarrhea, or urinary symptoms.  (Consider location/radiation/quality/duration/timing/severity/associated sxs/prior Treatment) HPI  Past Medical History  Diagnosis Date  . Diabetes mellitus without complication   . Anxiety   . Hypocholesteremia   . Tobacco abuse   . Hyperlipidemia   . Hx of colonic polyps     Fhx Colon cancer   Past Surgical History  Procedure Laterality Date  . Cesarean section    . Tonsillectomy    . Appendectomy    . Cholecystectomy     Family History  Problem Relation Age of Onset  . AAA (abdominal aortic aneurysm) Mother   . Hyperlipidemia Mother   . Heart attack Brother 55  . Pneumonia Maternal Grandmother   . Cancer Sister     Colon  . Diabetes Sister   . Heart disease Sister    History  Substance Use Topics  . Smoking status: Current Every Day Smoker -- 0.50 packs/day for 30 years    Types: Cigarettes  . Smokeless  tobacco: Not on file     Comment: Just purchased smoking patches  . Alcohol Use: No   OB History    No data available     Review of Systems  Constitutional: Positive for fever. Negative for chills.  HENT: Negative for congestion and sore throat.   Eyes: Negative for visual disturbance.  Respiratory: Negative for cough, shortness of breath and wheezing.   Cardiovascular: Negative for chest pain and palpitations.  Gastrointestinal: Positive for nausea, vomiting and abdominal pain. Negative for diarrhea and blood in stool.  Genitourinary: Negative for dysuria, urgency, frequency, hematuria and difficulty urinating.  Musculoskeletal: Negative for back pain and neck pain.  Skin: Negative for rash.  Neurological: Positive for headaches. Negative for light-headedness.      Allergies  Levaquin; Eggs or egg-derived products; Iodine; Metformin and related; Shellfish allergy; Vitamin d analogs; Amoxicillin; Kombiglyze; Latex; and Penicillins  Home Medications   Prior to Admission medications   Medication Sig Start Date End Date Taking? Authorizing Provider  aspirin EC 81 MG tablet Take 81 mg by mouth daily.   Yes Historical Provider, MD  citalopram (CELEXA) 20 MG tablet TAKE 1 TABLET EACH DAY. 09/27/14  Yes Lucky CowboyWilliam McKeown, MD  FARXIGA 5 MG TABS tablet TAKE 1 TABLET DAILY. 09/25/14  Yes Lucky CowboyWilliam McKeown, MD  glucose blood test strip Use as instructed 10/20/13  Yes Melissa Smith, PA-C  montelukast (SINGULAIR) 10 MG tablet TAKE ONE TABLET AT BEDTIME. 10/25/14  Yes  Quentin Mulling, PA-C  dicyclomine (BENTYL) 20 MG tablet Take 1 tablet (20 mg total) by mouth 2 (two) times daily. 01/04/15   Everlene Farrier, PA-C  doxycycline (VIBRA-TABS) 100 MG tablet Take 1 tablet (100 mg total) by mouth 2 (two) times daily. Patient not taking: Reported on 01/04/2015 12/21/14   Margaree Mackintosh, MD  ondansetron (ZOFRAN ODT) 4 MG disintegrating tablet Take 1 tablet (4 mg total) by mouth every 8 (eight) hours as needed for  nausea or vomiting. 01/04/15   Everlene Farrier, PA-C  rosuvastatin (CRESTOR) 20 MG tablet Take 1 tablet (20 mg total) by mouth at bedtime. Patient not taking: Reported on 01/04/2015 12/25/14 12/25/15  Quentin Mulling, PA-C   BP 98/41 mmHg  Pulse 101  Temp(Src) 98.7 F (37.1 C) (Oral)  Resp 17  SpO2 93% Physical Exam  Constitutional: She appears well-developed and well-nourished. No distress.   Nontoxic appearing.  HENT:  Head: Normocephalic and atraumatic.  Mouth/Throat: Oropharynx is clear and moist. No oropharyngeal exudate.  Eyes: Conjunctivae are normal. Pupils are equal, round, and reactive to light. Right eye exhibits no discharge. Left eye exhibits no discharge.  Neck: Neck supple.  Cardiovascular: Normal rate, regular rhythm, normal heart sounds and intact distal pulses.  Exam reveals no gallop and no friction rub.   No murmur heard. Pulmonary/Chest: Effort normal and breath sounds normal. No respiratory distress. She has no wheezes. She has no rales.  Abdominal: Soft. Bowel sounds are normal. She exhibits no distension and no mass. There is tenderness. There is no rebound and no guarding.   Abdomen is soft. Bowel sounds are present. Mild mid abdominal tenderness to palpation. Negative psoas and obturator sign.  Musculoskeletal: She exhibits no edema.  Lymphadenopathy:    She has no cervical adenopathy.  Neurological: She is alert. Coordination normal.  Skin: Skin is warm and dry. No rash noted. She is not diaphoretic. No erythema. No pallor.  Psychiatric: She has a normal mood and affect. Her behavior is normal.  Nursing note and vitals reviewed.   ED Course  Procedures (including critical care time) Labs Review Labs Reviewed  COMPREHENSIVE METABOLIC PANEL - Abnormal; Notable for the following:    Glucose, Bld 273 (*)    Total Bilirubin 1.6 (*)    Anion gap 16 (*)    All other components within normal limits  CBC WITH DIFFERENTIAL/PLATELET - Abnormal; Notable for the  following:    WBC 13.3 (*)    RBC 5.16 (*)    Hemoglobin 16.6 (*)    HCT 47.7 (*)    Neutrophils Relative % 96 (*)    Neutro Abs 12.8 (*)    Lymphocytes Relative 2 (*)    Lymphs Abs 0.2 (*)    Monocytes Relative 2 (*)    All other components within normal limits  LIPASE, BLOOD    Imaging Review No results found.   EKG Interpretation None      Filed Vitals:   01/04/15 0904 01/04/15 1123 01/04/15 1258  BP: 112/63 100/45 98/41  Pulse: 114 100 101  Temp: 98.7 F (37.1 C)    TempSrc: Oral    Resp: SpO2: 97% 96% 93%     MDM   Meds given in ED:  Medications  sodium chloride 0.9 % bolus 1,000 mL (0 mLs Intravenous Stopped 01/04/15 1048)  ondansetron (ZOFRAN) injection 4 mg (4 mg Intravenous Given 01/04/15 0940)  sodium chloride 0.9 % bolus 1,000 mL (0 mLs Intravenous Stopped 01/04/15 1258)  acetaminophen (TYLENOL) tablet 500 mg (500 mg Oral Given 01/04/15 1121)  dicyclomine (BENTYL) capsule 10 mg (10 mg Oral Given 01/04/15 1314)    Discharge Medication List as of 01/04/2015 12:55 PM    START taking these medications   Details  dicyclomine (BENTYL) 20 MG tablet Take 1 tablet (20 mg total) by mouth 2 (two) times daily., Starting 01/04/2015, Until Discontinued, Print    ondansetron (ZOFRAN ODT) 4 MG disintegrating tablet Take 1 tablet (4 mg total) by mouth every 8 (eight) hours as needed for nausea or vomiting., Starting 01/04/2015, Until Discontinued, Print        Final diagnoses:  Non-intractable vomiting with nausea, vomiting of unspecified type   This is a 59 y.o. female  With history diabetes, previous appendectomy and cholecystectomy who presents to the emergency department complaining of mid abdominal pain and vomiting for the past 9 hours.  Patient reports that her husband is also having some gastric distress after they both ate spaghetti at a restaurant. She also reports her son is sick at home with GI illness.  Patient is afebrile nontoxic appearing.  Patient has mild midabdominal tenderness without peritoneal signs. Negative psoas and obturator sign. We'll give patient fluid bolus and Zofran and check blood work.  CBC indicates a glucose of 273, she is a diabetic.   She has normal liver enzymes. Her lipase is normal. CBC appears hemoconcentrated. Slightly elevated WBC at 13.3. Will give second fluid bolus and recheck. At second revaluation the patient reports feeling much better and denies abdominal pain. She does report feeling like her stomach is upset. Will give bentyl and prescriptions for both bentyl and zofran. She has tolerated PO liquids and tylenol in the ED prior to discharge. Likely GI viral illness or food poisoning based on history. I advised the patient to follow-up with their primary care provider this week. I advised the patient to return to the emergency department with new or worsening symptoms or new concerns. The patient verbalized understanding and agreement with plan.   This patient was discussed with Dr. Anitra Lauth who agrees with assessment and plan.      Everlene Farrier, PA-C 01/04/15 1727  Gwyneth Sprout, MD 01/05/15 [phone]

## 2015-01-04 NOTE — ED Notes (Addendum)
Pt c/o vomiting that started around 1 am this morning. Pt has had 4 episodes. Pt has some mid abd pain. Pt has no diarrhea.  Pt thinks could be food poisoning, after her and her spouse ate dinner he didn't feel well and then pt started with vomiting.

## 2015-01-04 NOTE — ED Notes (Signed)
Made Will PA aware of pt questioning why the Toradol was discontinued.  Stated he will be going to speak with patient regarding why.

## 2015-01-21 ENCOUNTER — Other Ambulatory Visit: Payer: Self-pay | Admitting: Internal Medicine

## 2015-01-24 ENCOUNTER — Ambulatory Visit: Payer: Self-pay | Admitting: Internal Medicine

## 2015-01-24 ENCOUNTER — Ambulatory Visit (INDEPENDENT_AMBULATORY_CARE_PROVIDER_SITE_OTHER): Payer: 59 | Admitting: Internal Medicine

## 2015-01-24 ENCOUNTER — Encounter: Payer: Self-pay | Admitting: Internal Medicine

## 2015-01-24 VITALS — BP 120/72 | HR 78 | Temp 98.0°F | Resp 18 | Ht 63.5 in

## 2015-01-24 DIAGNOSIS — J069 Acute upper respiratory infection, unspecified: Secondary | ICD-10-CM

## 2015-01-24 MED ORDER — AZITHROMYCIN 250 MG PO TABS: ORAL_TABLET | ORAL | Status: DC

## 2015-01-24 MED ORDER — PREDNISONE 20 MG PO TABS: ORAL_TABLET | ORAL | Status: DC

## 2015-01-24 NOTE — Progress Notes (Signed)
Patient ID: Candice [name], female   DOB: 12/18/1955, 59 y.o.   MRN: [id number]  HPI  Patient presents to the office for evaluation of cough.  It has been going on for 3 days.  Patient reports all the time, dry.  They also endorse change in voice, chills, fever, postnasal drip, shortness of breath, wheezing and sore throat..  They have tried advil.  They report that nothing has worked.  They denies other sick contacts.  She normally does have some seasonal allergies but this is usually more in the winter time.  She does smoke 1/2 pack per day.    Review of Systems  Constitutional: Positive for fever, chills and malaise/fatigue.  HENT: Positive for sore throat. Negative for ear pain.        Post nasal drip  Respiratory: Positive for cough, shortness of breath and wheezing.   Cardiovascular: Negative for chest pain and leg swelling.  Musculoskeletal: Positive for myalgias.  Skin: Negative.   Neurological: Negative for headaches.    PE:  General:  Alert and non-toxic, WDWN, NAD HEENT: NCAT, PERLA, EOM normal, no occular discharge or erythema.  Nasal mucosal edema with sinus tenderness to palpation.  Oropharynx clear with minimal oropharyngeal edema and erythema.  Mucous membranes moist and pink. Neck:  Cervical adenopathy Chest:  RRR no MRGs.  Lungs clear to auscultation A&P with no wheezes rhonchi or rales.   Abdomen: +BS x 4 quadrants, soft, non-tender, no guarding, rigidity, or rebound. Skin: warm and dry no rash Neuro: A&Ox4, CN II-XII grossly intact  Assessment and Plan:   1. Acute URI -start zyrtec -nasal saline -quit smoking -tylenol or advil prn for fever - azithromycin (ZITHROMAX Z-PAK) 250 MG tablet; 2 po day one, then 1 daily x 4 days  Dispense: 5 tablet; Refill: 0 - predniSONE (DELTASONE) 20 MG tablet; 3 tabs po day one, then 2 tabs daily x 4 days  Dispense: 11 tablet; Refill: 0

## 2015-01-24 NOTE — Patient Instructions (Signed)
Upper Respiratory Infection, Adult An upper respiratory infection (URI) is also sometimes known as the common cold. The upper respiratory tract includes the nose, sinuses, throat, trachea, and bronchi. Bronchi are the airways leading to the lungs. Most people improve within 1 week, but symptoms can last up to 2 weeks. A residual cough may last even longer.  CAUSES Many different viruses can infect the tissues lining the upper respiratory tract. The tissues become irritated and inflamed and often become very moist. Mucus production is also common. A cold is contagious. You can easily spread the virus to others by oral contact. This includes kissing, sharing a glass, coughing, or sneezing. Touching your mouth or nose and then touching a surface, which is then touched by another person, can also spread the virus. SYMPTOMS  Symptoms typically develop 1 to 3 days after you come in contact with a cold virus. Symptoms vary from person to person. They may include:  Runny nose.  Sneezing.  Nasal congestion.  Sinus irritation.  Sore throat.  Loss of voice (laryngitis).  Cough.  Fatigue.  Muscle aches.  Loss of appetite.  Headache.  Low-grade fever. DIAGNOSIS  You might diagnose your own cold based on familiar symptoms, since most people get a cold 2 to 3 times a year. Your caregiver can confirm this based on your exam. Most importantly, your caregiver can check that your symptoms are not due to another disease such as strep throat, sinusitis, pneumonia, asthma, or epiglottitis. Blood tests, throat tests, and X-rays are not necessary to diagnose a common cold, but they may sometimes be helpful in excluding other more serious diseases. Your caregiver will decide if any further tests are required. RISKS AND COMPLICATIONS  You may be at risk for a more severe case of the common cold if you smoke cigarettes, have chronic heart disease (such as heart failure) or lung disease (such as asthma), or if  you have a weakened immune system. The very young and very old are also at risk for more serious infections. Bacterial sinusitis, middle ear infections, and bacterial pneumonia can complicate the common cold. The common cold can worsen asthma and chronic obstructive pulmonary disease (COPD). Sometimes, these complications can require emergency medical care and may be life-threatening. PREVENTION  The best way to protect against getting a cold is to practice good hygiene. Avoid oral or hand contact with people with cold symptoms. Wash your hands often if contact occurs. There is no clear evidence that vitamin C, vitamin E, echinacea, or exercise reduces the chance of developing a cold. However, it is always recommended to get plenty of rest and practice good nutrition. TREATMENT  Treatment is directed at relieving symptoms. There is no cure. Antibiotics are not effective, because the infection is caused by a virus, not by bacteria. Treatment may include:  Increased fluid intake. Sports drinks offer valuable electrolytes, sugars, and fluids.  Breathing heated mist or steam (vaporizer or shower).  Eating chicken soup or other clear broths, and maintaining good nutrition.  Getting plenty of rest.  Using gargles or lozenges for comfort.  Controlling fevers with ibuprofen or acetaminophen as directed by your caregiver.  Increasing usage of your inhaler if you have asthma. Zinc gel and zinc lozenges, taken in the first 24 hours of the common cold, can shorten the duration and lessen the severity of symptoms. Pain medicines may help with fever, muscle aches, and throat pain. A variety of non-prescription medicines are available to treat congestion and runny nose. Your caregiver   can make recommendations and may suggest nasal or lung inhalers for other symptoms.  HOME CARE INSTRUCTIONS   Only take over-the-counter or prescription medicines for pain, discomfort, or fever as directed by your  caregiver.  Use a warm mist humidifier or inhale steam from a shower to increase air moisture. This may keep secretions moist and make it easier to breathe.  Drink enough water and fluids to keep your urine clear or pale yellow.  Rest as needed.  Return to work when your temperature has returned to normal or as your caregiver advises. You may need to stay home longer to avoid infecting others. You can also use a face mask and careful hand washing to prevent spread of the virus. SEEK MEDICAL CARE IF:   After the first few days, you feel you are getting worse rather than better.  You need your caregiver's advice about medicines to control symptoms.  You develop chills, worsening shortness of breath, or brown or red sputum. These may be signs of pneumonia.  You develop yellow or brown nasal discharge or pain in the face, especially when you bend forward. These may be signs of sinusitis.  You develop a fever, swollen neck glands, pain with swallowing, or white areas in the back of your throat. These may be signs of strep throat. SEEK IMMEDIATE MEDICAL CARE IF:   You have a fever.  You develop severe or persistent headache, ear pain, sinus pain, or chest pain.  You develop wheezing, a prolonged cough, cough up blood, or have a change in your usual mucus (if you have chronic lung disease).  You develop sore muscles or a stiff neck. Document Released: 03/25/2001 Document Revised: 12/22/2011 Document Reviewed: 01/04/2014 [employer] Patient Information 2015 [employer]. This information is not intended to replace advice given to you by your health care provider. Make sure you discuss any questions you have with your health care provider.  

## 2015-01-29 ENCOUNTER — Ambulatory Visit
Admission: RE | Admit: 2015-01-29 | Discharge: 2015-01-29 | Disposition: A | Discharge status: Home / Self Care | Payer: 59 | Source: Ambulatory Visit | Attending: Internal Medicine | Admitting: Internal Medicine

## 2015-01-29 ENCOUNTER — Ambulatory Visit (INDEPENDENT_AMBULATORY_CARE_PROVIDER_SITE_OTHER): Payer: 59 | Admitting: Internal Medicine

## 2015-01-29 ENCOUNTER — Encounter: Payer: Self-pay | Admitting: Internal Medicine

## 2015-01-29 ENCOUNTER — Telehealth: Payer: Self-pay | Admitting: *Deleted

## 2015-01-29 VITALS — BP 106/68 | HR 70 | Temp 97.5°F

## 2015-01-29 DIAGNOSIS — R062 Wheezing: Secondary | ICD-10-CM | POA: Diagnosis not present

## 2015-01-29 DIAGNOSIS — J9801 Acute bronchospasm: Secondary | ICD-10-CM | POA: Diagnosis not present

## 2015-01-29 DIAGNOSIS — E119 Type 2 diabetes mellitus without complications: Secondary | ICD-10-CM

## 2015-01-29 DIAGNOSIS — J209 Acute bronchitis, unspecified: Secondary | ICD-10-CM | POA: Diagnosis not present

## 2015-01-29 MED ORDER — CLARITHROMYCIN 500 MG PO TABS: ORAL_TABLET | ORAL | Status: DC

## 2015-01-29 MED ORDER — ALBUTEROL SULFATE HFA 108 (90 BASE) MCG/ACT IN AERS: 2.0000 | INHALATION_SPRAY | Freq: Four times a day (QID) | RESPIRATORY_TRACT | Status: DC | PRN

## 2015-01-29 NOTE — Telephone Encounter (Signed)
Notified patient with results

## 2015-01-29 NOTE — Patient Instructions (Signed)
Take Biaxin 500 mg twice daily for 10 days. Use albuterol and Symbicort inhalers. Have chest x-ray.

## 2015-01-29 NOTE — Progress Notes (Signed)
   Subjective:    Patient ID: Candice [name] PatientJoni D Pote, female    DOB: 27-Feb-1956, 59 y.o.   MRN: [id number]  HPI  Has had cough and low-grade fever for several days. Went to doctor last week and was given a Zithromax Z-PAK and prednisone. She did not take prednisone because she was afraid it would run up her glucose. She has a history of diabetes. Initially had symptoms of sore throat.    Review of Systems     Objective:   Physical Exam  She has bilateral wheezing right greater than left. Pulse oximetry is 94%. Pharynx is clear.      Assessment & Plan:  Bronchitis  Bronchospasm  Controlled type 2 diabetes mellitus  Plan: Biaxin 500 mg twice daily for 10 days. She is allergic to Levaquin-- it causes hives. Albuterol inhaler 2 sprays by mouth 4 times a day when necessary. Symbicort sample 160 mg/4.5  1 spray by mouth every 12 hours. Chest x-ray.

## 2015-02-05 ENCOUNTER — Other Ambulatory Visit: Payer: Self-pay

## 2015-02-20 ENCOUNTER — Other Ambulatory Visit: Payer: Self-pay | Admitting: Emergency Medicine

## 2015-02-21 ENCOUNTER — Telehealth: Payer: Self-pay | Admitting: *Deleted

## 2015-02-21 DIAGNOSIS — R9389 Abnormal findings on diagnostic imaging of other specified body structures: Secondary | ICD-10-CM

## 2015-02-21 NOTE — Telephone Encounter (Signed)
[facility] [id number] Patient to call and schedule appt authorization expires 04/07/15

## 2015-03-16 ENCOUNTER — Telehealth: Payer: Self-pay | Admitting: Internal Medicine

## 2015-03-16 MED ORDER — CLARITHROMYCIN 500 MG PO TABS
ORAL_TABLET | ORAL | Status: DC
Start: 2015-03-16 — End: 2015-07-23

## 2015-03-16 NOTE — Telephone Encounter (Signed)
Left ear stopped up and roaring. Having difficulty hearing out of that ear. No recent URI.  Left ear checked in office. She is afebrile. Left TM is full and pink. Right TM full but not pink  Left otitis media  Plan: Sudafed PE and Biaxin 500 mg twice daily for 10 days. Has to schedule CT so and for abnormal chest x-ray. She says she will do this before the end of June.

## 2015-03-22 ENCOUNTER — Other Ambulatory Visit: Payer: Self-pay | Admitting: Internal Medicine

## 2015-04-02 ENCOUNTER — Ambulatory Visit: Payer: Self-pay | Admitting: Physician Assistant

## 2015-04-06 ENCOUNTER — Ambulatory Visit
Admission: RE | Admit: 2015-04-06 | Discharge: 2015-04-06 | Disposition: A | Discharge status: Home / Self Care | Payer: 59 | Source: Ambulatory Visit | Attending: Internal Medicine | Admitting: Internal Medicine

## 2015-04-06 DIAGNOSIS — R9389 Abnormal findings on diagnostic imaging of other specified body structures: Secondary | ICD-10-CM

## 2015-04-19 ENCOUNTER — Other Ambulatory Visit: Payer: Self-pay | Admitting: Physician Assistant

## 2015-07-23 ENCOUNTER — Other Ambulatory Visit: Payer: Self-pay | Admitting: Internal Medicine

## 2015-08-20 ENCOUNTER — Encounter: Payer: Self-pay | Admitting: *Deleted

## 2015-08-23 ENCOUNTER — Other Ambulatory Visit: Payer: Self-pay | Admitting: Physician Assistant

## 2015-08-23 MED ORDER — CITALOPRAM HYDROBROMIDE 20 MG PO TABS: ORAL_TABLET | ORAL | Status: DC

## 2015-08-30 ENCOUNTER — Encounter: Payer: Self-pay | Admitting: *Deleted

## 2015-09-10 ENCOUNTER — Other Ambulatory Visit: Payer: Self-pay | Admitting: Physician Assistant

## 2015-09-11 ENCOUNTER — Other Ambulatory Visit: Payer: Self-pay | Admitting: Physician Assistant

## 2015-09-11 MED ORDER — CITALOPRAM HYDROBROMIDE 20 MG PO TABS: ORAL_TABLET | ORAL | Status: DC

## 2015-10-03 ENCOUNTER — Ambulatory Visit: Payer: Self-pay | Admitting: Physician Assistant

## 2015-10-04 ENCOUNTER — Encounter: Payer: Self-pay | Admitting: Internal Medicine

## 2015-10-04 ENCOUNTER — Ambulatory Visit (INDEPENDENT_AMBULATORY_CARE_PROVIDER_SITE_OTHER): Payer: 59 | Admitting: Internal Medicine

## 2015-10-04 VITALS — BP 118/70 | HR 82 | Temp 98.0°F | Resp 18 | Ht 63.5 in | Wt 164.0 lb

## 2015-10-04 DIAGNOSIS — K5732 Diverticulitis of large intestine without perforation or abscess without bleeding: Secondary | ICD-10-CM

## 2015-10-04 MED ORDER — ONDANSETRON 8 MG PO TBDP: ORAL_TABLET | ORAL | Status: DC

## 2015-10-04 MED ORDER — METRONIDAZOLE 500 MG PO TABS: 500.0000 mg | ORAL_TABLET | Freq: Two times a day (BID) | ORAL | Status: DC

## 2015-10-04 MED ORDER — SULFAMETHOXAZOLE-TRIMETHOPRIM 800-160 MG PO TABS: 1.0000 | ORAL_TABLET | Freq: Two times a day (BID) | ORAL | Status: DC

## 2015-10-04 NOTE — Addendum Note (Signed)
Addended by: Mayerli Kirst A on: 10/04/2015 02:54 PM   Modules accepted: Orders

## 2015-10-04 NOTE — Progress Notes (Signed)
Patient ID: Candice [name] PatientJoni D [name], female   DOB: 1956/07/07, 59 y.o.   MRN: [id number] HPI  Patient complains of AB pain. Onset was 4 days ago. Symptoms have been gradually improving. The pain is described as aching and stabbing. Pain is located in the LLQ without radiation.  Aggravating factors: recumbency and movement.  Alleviating factors: doxycycline and tylenol. Associated symptoms: belching, fever,anorexia, and flatus. The patient denies arthralagias, chills, constipation, diarrhea, dysuria, frequency, headache, hematochezia, hematuria, melena, nausea and vomiting. She reports that she has had diverticulitis in the past.  She reports that they diagnosed it on the CT scan in 2015.  She also has had colonoscopy in the past.    She reports that blood sugars are running in the 200's.    Review of Systems  Constitutional: Positive for fever. Negative for chills and malaise/fatigue.  HENT: Negative for congestion, ear pain and sore throat.   Respiratory: Negative for cough, shortness of breath and wheezing.   Cardiovascular: Negative for chest pain, palpitations and leg swelling.  Gastrointestinal: Positive for abdominal pain. Negative for heartburn, nausea, vomiting, diarrhea, constipation, blood in stool and melena.  Genitourinary: Negative.   Neurological: Negative for dizziness, sensory change, loss of consciousness and headaches.  Psychiatric/Behavioral: Negative for depression. The patient is not nervous/anxious and does not have insomnia.     Physical exam:  Filed Vitals:   10/04/15 1406  BP: 118/70  Pulse: 82  Temp: 98 F (36.7 C)  Resp: 18  No tylenol on board   General:  Well developed well nourished, non-toxic appearing, NAD Head:  NCAT, PERLA, normal EOM, conjunctiva normal, ears clear bilaterally with normal TMs, Oropharynx clear and moist without exudate, no oropharyngeal erythema or swelling Neck:  No JVD, no cervical adenopathy, no thyromegaly Lungs:  Clear to auscultation  A&P, no wheeze, rhonchi, rales.  Normal effort Heart:  RRR, no MRGs, normal peripheral pulses Abd:  +BS x 4, no distention, soft, mild LLQ pain, with no guarding, rigidity, or rebound.   GU:  No CVA tenderness bilaterally Skin:  No rash, warm and dry Neuro:  A&O x 3, CN II-XII intact, normal gait Psych:  Normal affect, no ideations, normal judgement and insight  Assessment and Plan:   1. Diverticulitis of colon -patient warned to go to ER for worsening abdominal pain or increased blood sugars with vomiting and intractable nausea.   - sulfamethoxazole-trimethoprim (BACTRIM DS,SEPTRA DS) 800-160 MG tablet; Take 1 tablet by mouth 2 (two) times daily.  Dispense: 20 tablet; Refill: 0 - metroNIDAZOLE (FLAGYL) 500 MG tablet; Take 1 tablet (500 mg total) by mouth 2 (two) times daily. One po bid x 10 days  Dispense: 20 tablet; Refill: 0 - ondansetron (ZOFRAN ODT) 8 MG disintegrating tablet; 8mg  ODT q4 hours prn nausea  Dispense: 20 tablet; Refill: 0

## 2015-10-18 ENCOUNTER — Other Ambulatory Visit: Payer: Self-pay | Admitting: Physician Assistant

## 2015-10-22 ENCOUNTER — Other Ambulatory Visit: Payer: 59 | Admitting: Internal Medicine

## 2015-10-23 ENCOUNTER — Encounter: Payer: 59 | Admitting: Internal Medicine

## 2015-10-25 ENCOUNTER — Ambulatory Visit (INDEPENDENT_AMBULATORY_CARE_PROVIDER_SITE_OTHER): Payer: 59 | Admitting: Physician Assistant

## 2015-10-25 ENCOUNTER — Encounter: Payer: Self-pay | Admitting: Physician Assistant

## 2015-10-25 ENCOUNTER — Other Ambulatory Visit: Payer: Self-pay

## 2015-10-25 VITALS — BP 110/64 | HR 88 | Temp 97.9°F | Resp 16 | Ht 63.5 in | Wt 176.0 lb

## 2015-10-25 DIAGNOSIS — E782 Mixed hyperlipidemia: Secondary | ICD-10-CM | POA: Diagnosis not present

## 2015-10-25 DIAGNOSIS — E559 Vitamin D deficiency, unspecified: Secondary | ICD-10-CM

## 2015-10-25 DIAGNOSIS — E119 Type 2 diabetes mellitus without complications: Secondary | ICD-10-CM

## 2015-10-25 DIAGNOSIS — I10 Essential (primary) hypertension: Secondary | ICD-10-CM | POA: Diagnosis not present

## 2015-10-25 DIAGNOSIS — Z0001 Encounter for general adult medical examination with abnormal findings: Secondary | ICD-10-CM

## 2015-10-25 DIAGNOSIS — R6889 Other general symptoms and signs: Secondary | ICD-10-CM

## 2015-10-25 DIAGNOSIS — F411 Generalized anxiety disorder: Secondary | ICD-10-CM | POA: Diagnosis not present

## 2015-10-25 DIAGNOSIS — Z79899 Other long term (current) drug therapy: Secondary | ICD-10-CM | POA: Diagnosis not present

## 2015-10-25 DIAGNOSIS — Z720 Tobacco use: Secondary | ICD-10-CM

## 2015-10-25 LAB — CBC WITH DIFFERENTIAL/PLATELET
BASOS ABS: 0.1 10*3/uL (ref 0.0–0.1)
Basophils Relative: 1 % (ref 0–1)
Eosinophils Absolute: 0.2 10*3/uL (ref 0.0–0.7)
Eosinophils Relative: 3 % (ref 0–5)
HEMATOCRIT: 48 % — AB (ref 36.0–46.0)
HEMOGLOBIN: 16.3 g/dL — AB (ref 12.0–15.0)
LYMPHS ABS: 1.9 10*3/uL (ref 0.7–4.0)
LYMPHS PCT: 29 % (ref 12–46)
MCH: 31.5 pg (ref 26.0–34.0)
MCHC: 34 g/dL (ref 30.0–36.0)
MCV: 92.7 fL (ref 78.0–100.0)
MPV: 10.5 fL (ref 8.6–12.4)
Monocytes Absolute: 0.4 10*3/uL (ref 0.1–1.0)
Monocytes Relative: 6 % (ref 3–12)
NEUTROS ABS: 3.9 10*3/uL (ref 1.7–7.7)
NEUTROS PCT: 61 % (ref 43–77)
PLATELETS: 214 10*3/uL (ref 150–400)
RBC: 5.18 MIL/uL — ABNORMAL HIGH (ref 3.87–5.11)
RDW: 12.8 % (ref 11.5–15.5)
WBC: 6.4 10*3/uL (ref 4.0–10.5)

## 2015-10-25 LAB — BASIC METABOLIC PANEL WITH GFR
BUN: 13 mg/dL (ref 7–25)
CO2: 22 mmol/L (ref 20–31)
Calcium: 9.8 mg/dL (ref 8.6–10.4)
Chloride: 101 mmol/L (ref 98–110)
Creat: 0.74 mg/dL (ref 0.50–1.05)
GFR, Est African American: >89 mL/min (ref >=60)
GFR, Est Non African American: >89 mL/min (ref >=60)
Glucose, Bld: 238 mg/dL — ABNORMAL HIGH (ref 65–99)
Potassium: 4.6 mmol/L (ref 3.5–5.3)
Sodium: 137 mmol/L (ref 135–146)

## 2015-10-25 LAB — MAGNESIUM: MAGNESIUM: 2 mg/dL (ref 1.5–2.5)

## 2015-10-25 LAB — HEPATIC FUNCTION PANEL
ALBUMIN: 4.6 g/dL (ref 3.6–5.1)
ALT: 19 U/L (ref 6–29)
AST: 17 U/L (ref 10–35)
Alkaline Phosphatase: 92 U/L (ref 33–130)
BILIRUBIN TOTAL: 0.7 mg/dL (ref 0.2–1.2)
Bilirubin, Direct: 0.1 mg/dL (ref <=0.2)
Indirect Bilirubin: 0.6 mg/dL (ref 0.2–1.2)
Total Protein: 7.1 g/dL (ref 6.1–8.1)

## 2015-10-25 LAB — LIPID PANEL
CHOL/HDL RATIO: 7.9 ratio — AB (ref <=5.0)
Cholesterol: 283 mg/dL — ABNORMAL HIGH (ref 125–200)
HDL: 36 mg/dL — AB (ref >=46)
LDL CALC: 175 mg/dL — AB (ref <130)
Triglycerides: 361 mg/dL — ABNORMAL HIGH (ref <150)
VLDL: 72 mg/dL — ABNORMAL HIGH (ref <30)

## 2015-10-25 NOTE — Patient Instructions (Addendum)
Diabetes is a very complicated disease...lets simplify it.  An easy way to look at it to understand the complications is if you think of the extra sugar floating in your blood stream as glass shards floating through your blood stream.    Diabetes affects your small vessels first: 1) The glass shards (sugar) scraps down the tiny blood vessels in your eyes and lead to diabetic retinopathy, the leading cause of blindness in the [place]. Diabetes is the leading cause of newly diagnosed adult (58 to 58 years of age) blindness in the [place].  2) The glass shards scratches down the tiny vessels of your legs leading to nerve damage called neuropathy and can lead to amputations of your feet. More than 60% of all non-traumatic amputations of lower limbs occur in people with diabetes.  3) Over time the small vessels in your brain are shredded and closed off, individually this does not cause any problems but over a long period of time many of the small vessels being blocked can lead to Vascular Dementia.   4) Your kidney's are a filter system and have a "net" that keeps certain things in the body and lets bad things out. Sugar shreds this net and leads to kidney damage and eventually failure. Decreasing the sugar that is destroying the net and certain blood pressure medications can help stop or decrease progression of kidney disease. Diabetes was the primary cause of kidney failure in 58 percent of all new cases in 2011.  5) Diabetes also destroys the small vessels in your penis that lead to erectile dysfunction. Eventually the vessels are so damaged that you may not be responsive to cialis or viagra.   Diabetes and your large vessels: Your larger vessels consist of your coronary arteries in your heart and the carotid vessels to your brain. Diabetes or even increased sugars put you at 300% increased risk of heart attack and stroke and this is why.. The sugar scrapes down your large blood vessels and your body  sees this as an internal injury and tries to repair itself. Just like you get a scab on your skin, your platelets will stick to the blood vessel wall trying to heal it. This is why we have diabetics on low dose aspirin daily, this prevents the platelets from sticking and can prevent plaque formation. In addition, your body takes cholesterol and tries to shove it into the open wound. This is why we want your LDL, or bad cholesterol, below 70.   The combination of platelets and cholesterol over 5-10 years forms plaque that can break off and cause a heart attack or stroke.   PLEASE REMEMBER:  Diabetes is preventable! Up to 85 percent of complications and morbidities among individuals with type 2 diabetes can be prevented, delayed, or effectively treated and minimized with regular visits to a health professional, appropriate monitoring and medication, and a healthy diet and lifestyle.  [facility]  7 a.m.-6:30 p.m., Monday 7 a.m.-5 p.m., Tuesday-Friday Schedule an appointment by calling [phone].  [facility] Schedule an appointment by calling [phone].  Encourage you to get the 3D Mammogram  The 3D Mammogram is much more specific and sensitive to pick up breast cancer. For women with fibrocystic breast or lumpy breast it can be hard to determine if it is cancer or not but the 3D mammogram is able to tell this difference which cuts back on unneeded additional tests or scary call backs.   - over 40%  increase in detection of breast cancer - over 40% reduction in false positives.  - fewer call backs - reduced anxiety - improved outcomes - [facility]      Bad carbs also include fruit juice, alcohol, and sweet tea. These are empty calories that do not signal to your brain that you are full.   Please remember the good carbs are still carbs which convert into sugar. So please measure them out no more than 1/2-1 cup of rice, oatmeal, pasta, and  beans  Veggies are however free foods! Pile them on.   Not all fruit is created equal. Please see the list below, the fruit at the bottom is higher in sugars than the fruit at the top. Please avoid all dried fruits.    We want weight loss that will last so you should lose 1-2 pounds a week.  THAT IS IT! Please pick THREE things a month to change. Once it is a habit check off the item. Then pick another three items off the list to become habits.  If you are already doing a habit on the list GREAT!  Cross that item off! o Don't drink your calories. Ie, alcohol, soda, fruit juice, and sweet tea.  o Drink more water. Drink a glass when you feel hungry or before each meal.  o Eat breakfast - Complex carb and protein (likeDannon light and fit yogurt, oatmeal, fruit, eggs, [place] bacon). o Measure your cereal.  Eat no more than one cup a day. (ie [place]ashi) o Eat an apple a day. o Add a vegetable a day. o Try a new vegetable a month. o Use Pam! Stop using oil or butter to cook. o Don't finish your plate or use smaller plates. o Share your dessert. o Eat sugar free Jello for dessert or frozen grapes. o Don't eat 2-3 hours before bed. o Switch to whole wheat bread, pasta, and brown rice. o Make healthier choices when you eat out. No fries! o Pick baked chicken, NOT fried. o Don't forget to SLOW DOWN when you eat. It is not going anywhere.  o Take the stairs. o Park far away in the parking lot o [employer] Farm[employer] soup cans (or weights) for 10 minutes while watching TV. o Walk at work for 10 minutes during break. o Walk outside 1 time a week with your friend, kids, dog, or significant other. o Start a walking group at church. o Walk the mall as much as you can tolerate.  o Keep a food diary. o Weigh yourself daily. o Walk for 15 minutes 3 days per week. o Cook at home more often and eat out less.  If life happens and you go back to old habits, it is okay.  Just start over. You can do it!   If you experience  chest pain, get short of breath, or tired during the exercise, please stop immediately and inform your doctor.

## 2015-10-25 NOTE — Progress Notes (Signed)
Complete Physical:  1. Type 2 diabetes mellitus without complication, without long-term current use of insulin (HCC) Discussed general issues about diabetes pathophysiology and management., Educational material distributed., Suggested low cholesterol diet., Encouraged aerobic exercise., Discussed foot care., Reminded to get yearly retinal exam. - CBC with Differential/Platelet - BASIC METABOLIC PANEL WITH GFR - Hepatic function panel - TSH - Hemoglobin A1c - Insulin, fasting - Urinalysis, Routine w reflex microscopic (not at [facility]) - Microalbumin / creatinine urine ratio - EKG 12-Lead  2. Mixed hyperlipidemia -continue medications, check lipids, decrease fatty foods, increase activity.  - TSH - Lipid panel  3. Tobacco abuse -Smoking cessation-  counseled patient on the dangers of tobacco use, advised patient to stop smoking, and reviewed strategies to maximize success, continue to try - [place], RETROPERITNL ABD,  LTD  4. Vitamin D deficiency - VITAMIN D 25 Hydroxy (Vit-D Deficiency, Fractures)  5. Medication management - Magnesium  6. Generalized anxiety disorder -continue medications, stress management techniques discussed, increase water, good sleep hygiene discussed, increase exercise, and increase veggies.   7. Encounter for general adult medical examination with abnormal findings -Get eye exam -Get MGM -Follow up GYN - Get colonoscopy   Continue diet and meds as discussed. Further disposition pending results of labs. Discussed med's effects and SE's.    HPI 60 y.o. female  presents for complete physical.  Her blood pressure has been controlled at home, today her BP is BP: 110/64 mmHg.  She does not workout. She denies chest pain, shortness of breath, dizziness.  She is not on cholesterol medication and denies myalgias. Her cholesterol is not at goal. The cholesterol was:   Lab Results  Component Value Date   CHOL 284* 12/25/2014   HDL 32* 12/25/2014   LDLCALC NOT CALC  12/25/2014   TRIG 572* 12/25/2014   CHOLHDL 8.9 12/25/2014    She has uncontrolled DM without complications, she is on bASA, she is not on ACE/ARB due to hypotension, on farxiga, does not check sugars at home and denies  paresthesia of the feet, polydipsia, polyuria and visual disturbances. Last A1C was:  Lab Results  Component Value Date   HGBA1C 8.7* 12/25/2014   Patient is on Vitamin D supplement. She is a smoker wants to quit for new grandbaby, however when her mom passed she started back smoking.   She is on celexa for anxiety.  She has allergies, is on claritin and singulair.  Treated for recent diverticulitis in Dec with bactrim and flagyl. Denies AB pain or diarrhea.    Current Medications:  Current Outpatient Prescriptions on File Prior to Visit  Medication Sig Dispense Refill  . aspirin EC 81 MG tablet Take 81 mg by mouth daily.    . citalopram (CELEXA) 20 MG tablet TAKE 1 TABLET EACH DAY. 30 tablet 0  . doxycycline (VIBRA-TABS) 100 MG tablet Take 1 tablet (100 mg total) by mouth 2 (two) times daily. 20 tablet 0  . FARXIGA 5 MG TABS tablet TAKE 1 TABLET DAILY. 30 tablet 0  . glucose blood (ONE TOUCH ULTRA TEST) test strip Check sugar 3 x daily or as directed 100 each 2  . ondansetron (ZOFRAN ODT) 8 MG disintegrating tablet 8mg  ODT q4 hours prn nausea 20 tablet 0   No current facility-administered medications on file prior to visit.   Medical History:  Past Medical History  Diagnosis Date  . Diabetes mellitus without complication (HCC)   . Anxiety   . Hypocholesteremia   . Tobacco abuse   .  Hyperlipidemia   . Hx of colonic polyps     Fhx Colon cancer   Allergies:  Allergies  Allergen Reactions  . Levaquin [Levofloxacin] Hives and Other (See Comments)    Tingling sensation to mouth, near LOC, blurred vision,    . Eggs Or Egg-Derived Products     Unknown reaction   . Iodine     Unknown reaction   . Metformin And Related Diarrhea  . Shellfish Allergy      Unknown reaction   . Vitamin D Analogs Diarrhea    High dose Vitamin D caused diarrhea  . Amoxicillin Rash  . Kombiglyze [Saxagliptin-Metformin Er] Other (See Comments)    Bloating  . Latex Itching and Rash  . Penicillins Rash    Review of Systems:  Review of Systems  Constitutional: Negative.   HENT: Positive for congestion.   Eyes: Negative.   Respiratory: Positive for cough. Negative for hemoptysis, sputum production, shortness of breath and wheezing.   Cardiovascular: Negative.   Gastrointestinal: Negative for diarrhea.  Genitourinary: Negative.   Musculoskeletal: Negative.   Skin: Negative.   Neurological: Negative.   Endo/Heme/Allergies: Negative.   Psychiatric/Behavioral: Negative.     Allergies Allergies  Allergen Reactions  . Levaquin [Levofloxacin] Hives and Other (See Comments)    Tingling sensation to mouth, near LOC, blurred vision,    . Eggs Or Egg-Derived Products     Unknown reaction   . Iodine     Unknown reaction   . Metformin And Related Diarrhea  . Shellfish Allergy     Unknown reaction   . Vitamin D Analogs Diarrhea    High dose Vitamin D caused diarrhea  . Amoxicillin Rash  . Kombiglyze [Saxagliptin-Metformin Er] Other (See Comments)    Bloating  . Latex Itching and Rash  . Penicillins Rash   Surgical history Past Surgical History  Procedure Laterality Date  . Cesarean section    . Tonsillectomy    . Appendectomy    . Cholecystectomy     Family history Family History  Problem Relation Age of Onset  . AAA (abdominal aortic aneurysm) Mother   . Hyperlipidemia Mother   . Heart attack Brother 55  . Pneumonia Maternal Grandmother   . Cancer Sister     Colon  . Diabetes Sister   . Heart disease Sister    Immunization History  Administered Date(s) Administered  . Tdap 02/14/2013   influenza declines, allergy MGM 2008, DUE PAP: 3 year ago, goes to GYN, DUE Colonoscopy DUE EKG 2014 CT chest 2016 Eye doctor 2 years ago, wears  glasses due.   Physical Exam: BP 110/64 mmHg  Pulse 88  Temp(Src) 97.9 F (36.6 C) (Temporal)  Resp 16  Ht 5' 3.5" (1.613 m)  Wt 176 lb (79.833 kg)  BMI 30.68 kg/m2  SpO2 98% Wt Readings from Last 3 Encounters:  10/25/15 176 lb (79.833 kg)  10/04/15 164 lb (74.39 kg)  12/25/14 175 lb (79.379 kg)   General Appearance: Well nourished, in no apparent distress. Eyes: PERRLA, EOMs, conjunctiva no swelling or erythema Sinuses: No Frontal/maxillary tenderness ENT/Mouth: Ext aud canals clear, TMs without erythema, bulging. No erythema, swelling, or exudate on post pharynx.  Tonsils not swollen or erythematous. Hearing normal.  Neck: Supple, thyroid normal.  Respiratory: Respiratory effort normal, BS equal bilaterally without rales, rhonchi, wheezing or stridor.  Cardio: RRR with no MRGs. Brisk peripheral pulses without edema.  Abdomen: Soft, + BS.  Non tender, no guarding, rebound, hernias, masses. Lymphatics: Non tender  without lymphadenopathy.  Breast: declines- will fu OB/GYN GYN: defer GYN Musculoskeletal: Full ROM, 5/5 strength, Normal gait Skin: Warm, dry without rashes, lesions, ecchymosis.  Neuro: Cranial nerves intact. No cerebellar symptoms.  Psych: Awake and oriented X 3, normal affect, Insight and Judgment appropriate.   EKG: NSR, no ST changes.  Aorta Scan: WNL  Quentin Mulling, PA-C 11:29 AM [facility]

## 2015-10-26 LAB — URINALYSIS, MICROSCOPIC ONLY
Bacteria, UA: NONE SEEN [HPF]
Casts: NONE SEEN [LPF]
Crystals: NONE SEEN [HPF]
RBC / HPF: NONE SEEN RBC/HPF (ref <=2)
Squamous Epithelial / LPF: NONE SEEN [HPF] (ref <=5)
WBC, UA: NONE SEEN WBC/HPF (ref <=5)
YEAST: NONE SEEN [HPF]

## 2015-10-26 LAB — URINALYSIS, ROUTINE W REFLEX MICROSCOPIC
Bilirubin Urine: NEGATIVE
Hgb urine dipstick: NEGATIVE
Ketones, ur: NEGATIVE
LEUKOCYTES UA: NEGATIVE
Nitrite: NEGATIVE
PH: 5 (ref 5.0–8.0)
PROTEIN: NEGATIVE
Specific Gravity, Urine: 1.037 — ABNORMAL HIGH (ref 1.001–1.035)

## 2015-10-26 LAB — MICROALBUMIN / CREATININE URINE RATIO
CREATININE, URINE: 40 mg/dL (ref 20–320)
MICROALB UR: 0.2 mg/dL
MICROALB/CREAT RATIO: 5 ug/mg{creat} (ref <30)

## 2015-10-26 LAB — HEMOGLOBIN A1C
Hgb A1c MFr Bld: 10 % — ABNORMAL HIGH (ref <5.7)
Mean Plasma Glucose: 240 mg/dL — ABNORMAL HIGH (ref <117)

## 2015-10-26 LAB — TSH: TSH: 0.588 u[IU]/mL (ref 0.350–4.500)

## 2015-10-26 LAB — INSULIN, FASTING: Insulin fasting, serum: 9 u[IU]/mL (ref 2.0–19.6)

## 2015-10-26 LAB — VITAMIN D 25 HYDROXY (VIT D DEFICIENCY, FRACTURES): Vit D, 25-Hydroxy: 9 ng/mL — ABNORMAL LOW (ref 30–100)

## 2015-11-08 ENCOUNTER — Ambulatory Visit (INDEPENDENT_AMBULATORY_CARE_PROVIDER_SITE_OTHER): Payer: 59 | Admitting: Internal Medicine

## 2015-11-08 ENCOUNTER — Encounter: Payer: Self-pay | Admitting: Internal Medicine

## 2015-11-08 VITALS — BP 118/76 | HR 92 | Temp 97.8°F | Resp 18 | Ht 63.5 in | Wt 178.0 lb

## 2015-11-08 DIAGNOSIS — H109 Unspecified conjunctivitis: Secondary | ICD-10-CM | POA: Diagnosis not present

## 2015-11-08 MED ORDER — ERYTHROMYCIN 5 MG/GM OP OINT: TOPICAL_OINTMENT | Freq: Two times a day (BID) | OPHTHALMIC | Status: AC

## 2015-11-08 NOTE — Progress Notes (Signed)
   Subjective:    [name] ID: Candice [name], female    DOB: [birth year], 60 y.o.   MRN: [id number]  Eye Problem  The right eye is affected. This is a new problem. The current episode started yesterday. The problem has been unchanged. The pain is mild. There is no known exposure to pink eye. She does not wear contacts. Associated symptoms include blurred vision, an eye discharge, eye redness and a foreign body sensation. Pertinent negatives include no double vision, fever, nausea, photophobia, recent URI or vomiting. She has tried nothing for the symptoms. The treatment provided no relief.   She also reports that she is having some sore throat as well and just feels a little bit worn out.     Review of Systems  Constitutional: Negative for fever.  HENT: Positive for postnasal drip and sore throat. Negative for congestion, nosebleeds, rhinorrhea and voice change.   Eyes: Positive for blurred vision, discharge and redness. Negative for double vision and photophobia.  Gastrointestinal: Negative for nausea and vomiting.       Objective:   Physical Exam  Constitutional: She is oriented to person, place, and time. She appears well-developed and well-nourished. No distress.  HENT:  Head: Normocephalic.  Mouth/Throat: Oropharynx is clear and moist. No oropharyngeal exudate.  Eyes: EOM and lids are normal. Pupils are equal, round, and reactive to light. Right eye exhibits no chemosis. Right conjunctiva is injected. Right conjunctiva has no hemorrhage. Left conjunctiva is not injected. Left conjunctiva has no hemorrhage. No scleral icterus.  Neck: Normal range of motion. Neck supple. No JVD present. No thyromegaly present.  Cardiovascular: Normal rate, regular rhythm, normal heart sounds and intact distal pulses.  Exam reveals no gallop and no friction rub.   No murmur heard. Pulmonary/Chest: Effort normal and breath sounds normal. No respiratory distress. She has no wheezes. She has no rales.  She exhibits no tenderness.  Musculoskeletal: Normal range of motion.  Neurological: She is alert and oriented to person, place, and time.  Skin: Skin is warm and dry. She is not diaphoretic.  Psychiatric: She has a normal mood and affect. Her behavior is normal. Judgment and thought content normal.  Nursing note and vitals reviewed.    Filed Vitals:   11/08/15 1527  BP: 118/76  Pulse: 92  Temp: 97.8 F (36.6 C)  Resp: 18           Assessment & Plan:    1. Conjunctivitis of right eye -romycin -likely viral -to go to ER if vision decreases or increased eye pain.

## 2015-11-08 NOTE — Patient Instructions (Signed)
Viral Conjunctivitis  Viral conjunctivitis is an inflammation of the clear membrane that covers the white part of your eye and the inner surface of your eyelid (conjunctiva). The inflammation is caused by a viral infection. The blood vessels in the conjunctiva become inflamed, causing the eye to become red or pink, and often itchy. Viral conjunctivitis can easily be passed from one person to another (contagious).  CAUSES   Viral conjunctivitis is caused by a virus. A virus is a type of contagious germ. It can be spread by touching objects that have been contaminated with the virus, such as doorknobs or towels.   SYMPTOMS   Symptoms of viral conjunctivitis may include:   · Eye redness.  · Tearing or watery eyes.  · Itchy eyes.  · Burning feeling in the eyes.  · Clear drainage from the eye.  · Swollen eyelids.  · A gritty feeling in the eye.  · Light sensitivity.  DIAGNOSIS   Viral conjunctivitis may be diagnosed with a medical history and physical exam. If you have discharge from your eye, the discharge may be tested to rule out other causes of conjunctivitis.   TREATMENT   Viral conjunctivitis does not respond to medicines that kill bacteria (antibiotics). Treatment for viral conjunctivitis is directed at stopping a bacterial infection from developing in addition to the viral infection. Treatment also aims to relieve your symptoms, such as itching. This may be done with antihistamine drops or other eye medicines.  HOME CARE INSTRUCTIONS  · Take medicines only as directed by your health care provider.  · Avoid touching or rubbing your eyes.  · Apply a warm, clean washcloth to your eye for 10-20 minutes, 3-4 times per day.  · If you wear contact lenses, do not wear them until the inflammation is gone and your health care provider says it is safe to wear them again. Ask your health care provider how to sterilize or replace your contact lenses before using them again. Wear glasses until you can resume wearing  contacts.  · Avoid wearing eye makeup until the inflammation is gone. Throw away any old eye cosmetics that may be contaminated.  · Change or wash your pillowcase every day.  · Do not share towels or washcloths. This may spread the infection.  · Wash your hands often with soap and water. Use paper towels to dry your hands.  · Gently wipe away any drainage from your eye with a warm, wet washcloth or a cotton ball.  · Be very careful to avoid touching the edge of the eyelid with the eye drop bottle or ointment tube when applying medicines to the affected eye. This will stop you from spreading the infection to the other eye or to other people.  SEEK MEDICAL CARE IF:   · Your symptoms do not improve with treatment.  · You have increased pain.  · Your vision becomes blurry.  · You have a fever.  · You have facial pain, redness, or swelling.  · You have new symptoms.  · Your symptoms get worse.     This information is not intended to replace advice given to you by your health care provider. Make sure you discuss any questions you have with your health care provider.     Document Released: 12/20/2002 Document Revised: 03/23/2006 Document Reviewed: 07/11/2014  Elsevier Interactive Patient Education ©2016 Else[employer]    Bacterial Conjunctivitis  Bacterial conjunctivitis, commonly called pink eye, is an inflammation of the clear membrane that covers   the white part of the eye (conjunctiva). The inflammation can also happen on the underside of the eyelids. The blood vessels in the conjunctiva become inflamed, causing the eye to become red or pink. Bacterial conjunctivitis may spread easily from one eye to another and from person to person (contagious).   CAUSES   Bacterial conjunctivitis is caused by bacteria. The bacteria may come from your own skin, your upper respiratory tract, or from someone else with bacterial conjunctivitis.  SYMPTOMS   The normally white color of the eye or the underside of the eyelid is usually pink  or red. The pink eye is usually associated with irritation, tearing, and some sensitivity to light. Bacterial conjunctivitis is often associated with a thick, yellowish discharge from the eye. The discharge may turn into a crust on the eyelids overnight, which causes your eyelids to stick together. If a discharge is present, there may also be some blurred vision in the affected eye.  DIAGNOSIS   Bacterial conjunctivitis is diagnosed by your caregiver through an eye exam and the symptoms that you report. Your caregiver looks for changes in the surface tissues of your eyes, which may point to the specific type of conjunctivitis. A sample of any discharge may be collected on a cotton-tip swab if you have a severe case of conjunctivitis, if your cornea is affected, or if you keep getting repeat infections that do not respond to treatment. The sample will be sent to a lab to see if the inflammation is caused by a bacterial infection and to see if the infection will respond to antibiotic medicines.  TREATMENT   · Bacterial conjunctivitis is treated with antibiotics. Antibiotic eyedrops are most often used. However, antibiotic ointments are also available. Antibiotics pills are sometimes used. Artificial tears or eye washes may ease discomfort.  HOME CARE INSTRUCTIONS   · To ease discomfort, apply a cool, clean washcloth to your eye for 10-20 minutes, 3-4 times a day.  · Gently wipe away any drainage from your eye with a warm, wet washcloth or a cotton ball.  · Wash your hands often with soap and water. Use paper towels to dry your hands.  · Do not share towels or washcloths. This may spread the infection.  · Change or wash your pillowcase every day.  · You should not use eye makeup until the infection is gone.  · Do not operate machinery or drive if your vision is blurred.  · Stop using contact lenses. Ask your caregiver how to sterilize or replace your contacts before using them again. This depends on the type of contact  lenses that you use.  · When applying medicine to the infected eye, do not touch the edge of your eyelid with the eyedrop bottle or ointment tube.  SEEK IMMEDIATE MEDICAL CARE IF:   · Your infection has not improved within 3 days after beginning treatment.  · You had yellow discharge from your eye and it returns.  · You have increased eye pain.  · Your eye redness is spreading.  · Your vision becomes blurred.  · You have a fever or persistent symptoms for more than 2-3 days.  · You have a fever and your symptoms suddenly get worse.  · You have facial pain, redness, or swelling.  MAKE SURE YOU:   · Understand these instructions.  · Will watch your condition.  · Will get help right away if you are not doing well or get worse.     This information   is not intended to replace advice given to you by your health care provider. Make sure you discuss any questions you have with your health care provider.     Document Released: 09/29/2005 Document Revised: 10/20/2014 Document Reviewed: 03/01/2012  Elsevier Interactive Patient Education ©2016 [employer]vier Inc[employer]

## 2015-11-15 ENCOUNTER — Ambulatory Visit: Payer: Self-pay | Admitting: Physician Assistant

## 2015-11-17 ENCOUNTER — Other Ambulatory Visit: Payer: Self-pay | Admitting: Internal Medicine

## 2015-12-19 ENCOUNTER — Encounter: Payer: Self-pay | Admitting: Physician Assistant

## 2015-12-19 ENCOUNTER — Ambulatory Visit (INDEPENDENT_AMBULATORY_CARE_PROVIDER_SITE_OTHER): Payer: 59 | Admitting: Physician Assistant

## 2015-12-19 VITALS — BP 130/70 | HR 109 | Temp 97.2°F | Resp 16 | Ht 63.5 in | Wt 176.6 lb

## 2015-12-19 DIAGNOSIS — Z720 Tobacco use: Secondary | ICD-10-CM | POA: Diagnosis not present

## 2015-12-19 DIAGNOSIS — M791 Myalgia, unspecified site: Secondary | ICD-10-CM

## 2015-12-19 DIAGNOSIS — D649 Anemia, unspecified: Secondary | ICD-10-CM

## 2015-12-19 DIAGNOSIS — M7630 Iliotibial band syndrome, unspecified leg: Secondary | ICD-10-CM

## 2015-12-19 LAB — MAGNESIUM: Magnesium: 2.1 mg/dL (ref 1.5–2.5)

## 2015-12-19 LAB — CBC WITH DIFFERENTIAL/PLATELET
BASOS ABS: 0.1 10*3/uL (ref 0.0–0.1)
Basophils Relative: 1 % (ref 0–1)
Eosinophils Absolute: 0.1 10*3/uL (ref 0.0–0.7)
Eosinophils Relative: 2 % (ref 0–5)
HEMATOCRIT: 46.8 % — AB (ref 36.0–46.0)
HEMOGLOBIN: 15.9 g/dL — AB (ref 12.0–15.0)
LYMPHS ABS: 1.6 10*3/uL (ref 0.7–4.0)
LYMPHS PCT: 25 % (ref 12–46)
MCH: 31.2 pg (ref 26.0–34.0)
MCHC: 34 g/dL (ref 30.0–36.0)
MCV: 91.9 fL (ref 78.0–100.0)
MPV: 10.6 fL (ref 8.6–12.4)
Monocytes Absolute: 0.4 10*3/uL (ref 0.1–1.0)
Monocytes Relative: 6 % (ref 3–12)
NEUTROS PCT: 66 % (ref 43–77)
Neutro Abs: 4.1 10*3/uL (ref 1.7–7.7)
Platelets: 232 10*3/uL (ref 150–400)
RBC: 5.09 MIL/uL (ref 3.87–5.11)
RDW: 12.6 % (ref 11.5–15.5)
WBC: 6.2 10*3/uL (ref 4.0–10.5)

## 2015-12-19 LAB — HEPATIC FUNCTION PANEL
ALBUMIN: 4.6 g/dL (ref 3.6–5.1)
ALK PHOS: 109 U/L (ref 33–130)
ALT: 18 U/L (ref 6–29)
AST: 14 U/L (ref 10–35)
Bilirubin, Direct: 0.1 mg/dL (ref <=0.2)
Indirect Bilirubin: 0.5 mg/dL (ref 0.2–1.2)
TOTAL PROTEIN: 7.2 g/dL (ref 6.1–8.1)
Total Bilirubin: 0.6 mg/dL (ref 0.2–1.2)

## 2015-12-19 LAB — IRON AND TIBC
%SAT: 30 % (ref 11–50)
IRON: 109 ug/dL (ref 45–160)
TIBC: 360 ug/dL (ref 250–450)
UIBC: 251 ug/dL (ref 125–400)

## 2015-12-19 LAB — BASIC METABOLIC PANEL WITH GFR
BUN: 19 mg/dL (ref 7–25)
CHLORIDE: 99 mmol/L (ref 98–110)
CO2: 25 mmol/L (ref 20–31)
Calcium: 9.7 mg/dL (ref 8.6–10.4)
Creat: 0.88 mg/dL (ref 0.50–1.05)
GFR, Est African American: 84 mL/min (ref >=60)
GFR, Est Non African American: 73 mL/min (ref >=60)
GLUCOSE: 342 mg/dL — AB (ref 65–99)
POTASSIUM: 4.5 mmol/L (ref 3.5–5.3)
Sodium: 137 mmol/L (ref 135–146)

## 2015-12-19 LAB — CK: Total CK: 58 U/L (ref 7–177)

## 2015-12-19 LAB — FERRITIN: FERRITIN: 201 ng/mL (ref 10–232)

## 2015-12-19 MED ORDER — MELOXICAM 15 MG PO TABS: ORAL_TABLET | ORAL | Status: DC

## 2015-12-19 NOTE — Progress Notes (Signed)
   Subjective:    Patient ID: Candice [name] PatientJoni D [name], female    DOB: December 17, 1955, 60 y.o.   MRN: [id number]  HPI 60 y.o. smoking WF with history of uncontrolled DM presents with bilateral leg pain x 1 week. Bilateral lateral legs, now worse with left leg at thighs, dull ache, constant pain. Worse to palpation, worse after sitting for long time and then standing, NOT worse with walking/exertion. Has tried aleve/advil that helps some.   No numbness, tingling, burning pain at bilateral feet, no weakness, no lower back pain.   Blood pressure 130/70, pulse 109, temperature 97.2 F (36.2 C), temperature source Temporal, resp. rate 16, height 5' 3.5" (1.613 m), weight 176 lb 9.6 oz (80.105 kg), SpO2 95 %.  Current Outpatient Prescriptions on File Prior to Visit  Medication Sig Dispense Refill  . aspirin EC 81 MG tablet Take 81 mg by mouth daily.    . citalopram (CELEXA) 20 MG tablet TAKE 1 TABLET EACH DAY. 90 tablet 0  . FARXIGA 5 MG TABS tablet TAKE 1 TABLET DAILY. 30 tablet 0  . glucose blood (ONE TOUCH ULTRA TEST) test strip Check sugar 3 x daily or as directed 100 each 2  . montelukast (SINGULAIR) 10 MG tablet     . ondansetron (ZOFRAN ODT) 8 MG disintegrating tablet 8mg  ODT q4 hours prn nausea 20 tablet 0   No current facility-administered medications on file prior to visit.   Past Medical History  Diagnosis Date  . Diabetes mellitus without complication (HCC)   . Anxiety   . Hypocholesteremia   . Tobacco abuse   . Hyperlipidemia   . Hx of colonic polyps     Fhx Colon cancer   Review of Systems  Constitutional: Negative.   HENT: Negative.   Respiratory: Negative.   Cardiovascular: Negative.   Gastrointestinal: Negative.   Genitourinary: Negative.   Musculoskeletal: Positive for myalgias. Negative for back pain, joint swelling, arthralgias, gait problem, neck pain and neck stiffness.  Skin: Negative.   Neurological: Negative.  Negative for tremors, weakness and numbness.        Objective:   Physical Exam  Constitutional: She is oriented to person, place, and time. She appears well-developed and well-nourished. No distress.  Cardiovascular: Normal rate, regular rhythm and normal heart sounds.   Pulmonary/Chest: Effort normal and breath sounds normal.  Abdominal: Soft. Bowel sounds are normal.  Musculoskeletal: She exhibits tenderness.  + tender IT bands, FROM bilateral hips without pain, no SI pain, normal strength without foot drop, normal distal neurovascular exam without edema, erythema, or warmth.   Neurological: She is alert and oriented to person, place, and time. She displays normal reflexes. She exhibits normal muscle tone.  Skin: No rash noted.       Assessment & Plan:  1. Myalgia/IT band syndrome Very mechanical and TTP on IT band Normal reflexes, strength, normal pulses and not worse with walking Not on Statin, not worse at night and legs don't feel restless Denies tick exposure.  Mobic x 2 weeks, stretches given, check labs, if worse/not better 1 month follow up - Sedimentation rate - CK - BASIC METABOLIC PANEL WITH GFR - Hepatic function panel - Magnesium - Iron and TIBC - Ferritin  2. Tobacco abuse Advised to quit

## 2015-12-19 NOTE — Patient Instructions (Signed)
Iliotibial Band Syndrome With Rehab The iliotibial (IT) band is a tendon that connects the hip muscles to the shinbone (tibia) and to one of the bones of the pelvis (ileum). The IT band passes by the knee and is often irritated by the outer portion of the knee (lateral femoral condyle). A fluid filled sac (bursa) exists between the tendon and the bone, to cushion and reduce friction. Overuse of the tendon may cause excessive friction, which results in IT band syndrome. This condition involves inflammation of the bursa (bursitis) and/or inflammation of the IT band (tendinitis). SYMPTOMS   Pain, tenderness, swelling, warmth, or redness over the IT band, at the outer knee (above the joint).  Pain that travels up or down the thigh or leg.  Initially, pain at the beginning of an exercise, that decreases once warmed up. Eventually, pain throughout the activity, getting worse as the activity continues. May cause the athlete to stop in the middle of training or competing.  Pain that gets worse when running down hills or stairs, on banked tracks, or next to the curb on the street.  Pain that increases when the foot of the affected leg hits the ground.  Possibly, a crackling sound (crepitation) when the tendon or bursa is moved or touched. CAUSES  IT band syndrome is caused by irritation of the IT band and the underlying bursa. This eventually results in inflammation and pain. IT band syndrome is an overuse injury.  RISK INCREASES WITH:  Sports with repetitive knee-bending activities (distance running, cycling).  Incorrect training techniques, including sudden changes in the intensity, frequency, or duration of training.  Not enough rest between workouts.  Poor strength and flexibility, especially a tight IT band.  Failure to warm up properly before activity.  Bow legs.  Arthritis of the knee. PREVENTION   Warm up and stretch properly before activity.  Allow for adequate recovery between  workouts.  Maintain physical fitness:  Strength, flexibility, and endurance.  Cardiovascular fitness.  Learn and use proper training technique, including reducing running mileage, shortening stride, and avoiding running on hills and banked surfaces.  Wear arch supports (orthotics), if you have flat feet. PROGNOSIS  If treated properly, IT band syndrome usually goes away within 6 weeks of treatment. RELATED COMPLICATIONS   Longer healing time, if not properly treated, or if not given enough time to heal.  Recurring inflammation of the tendon and bursa, that may result in a chronic condition.  Recurring symptoms, if activity is resumed too soon, with overuse, with a direct blow, or with poor training technique.  Inability to complete training or competition. TREATMENT  Treatment first involves the use of ice and medicine, to reduce pain and inflammation. The use of strengthening and stretching exercises may help reduce pain with activity. These exercises may be performed at home or with a therapist. For individuals with flat feet, an arch support (orthotic) may be helpful. Some individuals find that wearing a knee sleeve or compression bandage around the knee during workouts provides some relief. Certain training techniques, such as adjusting stride length, avoiding running on hills or stairs, changing the direction you run on a circular or banked track, or changing the side of the road you run on, if you run next to the curb, may help decrease symptoms of IT band syndrome. Cyclists may need to change the seat height or foot position on their bicycles. An injection of cortisone into the bursa may be recommended. Surgery to remove the inflamed bursa and/or part   of the IT band is only considered after at least 6 months of non-surgical treatment.  MEDICATION   If pain medicine is needed, nonsteroidal anti-inflammatory medicines (aspirin and ibuprofen), or other minor pain relievers  (acetaminophen), are often advised.  Do not take pain medicine for 7 days before surgery.  Prescription pain relievers may be given, if your caregiver thinks they are needed. Use only as directed and only as much as you need.  Corticosteroid injections may be given by your caregiver. These injections should be reserved for the most serious cases, because they may only be given a certain number of times. HEAT AND COLD  Cold treatment (icing) should be applied for 10 to 15 minutes every 2 to 3 hours for inflammation and pain, and immediately after activity that aggravates your symptoms. Use ice packs or an ice massage.  Heat treatment may be used before performing stretching and strengthening activities prescribed by your caregiver, physical therapist, or athletic trainer. Use a heat pack or a warm water soak. SEEK MEDICAL CARE IF:   Symptoms get worse or do not improve in 2 to 4 weeks, despite treatment.  New, unexplained symptoms develop. (Drugs used in treatment may produce side effects.) EXERCISES  RANGE OF MOTION (ROM) AND STRETCHING EXERCISES - Iliotibial Band Syndrome These exercises may help you when beginning to rehabilitate your injury. Your symptoms may go away with or without further involvement from your physician, physical therapist or athletic trainer. While completing these exercises, remember:   Restoring tissue flexibility helps normal motion to return to the joints. This allows healthier, less painful movement and activity.  An effective stretch should be held for at least 30 seconds.  A stretch should never be painful. You should only feel a gentle lengthening or release in the stretched tissue. STRETCH - Quadriceps, Prone   Lie on your stomach on a firm surface, such as a bed or padded floor.  Bend your right / left knee and grasp your ankle. If you are unable to reach your ankle or pant leg, use a belt around your foot to lengthen your reach.  Gently pull your heel  toward your buttocks. Your knee should not slide out to the side. You should feel a stretch in the front of your thigh and knee.  Hold this position for __________ seconds. Repeat __________ times. Complete this stretch __________ times per day.  STRETCH - Iliotibial Band  On the floor or bed, lie on your side, so your right / left leg is on top. Bend your knee and grab your ankle.  Slowly bring your knee back so that your thigh is in line with your trunk. Keep your heel at your buttocks and gently arch your back, so your head, shoulders and hips line up.  Slowly lower your leg so that your knee approaches the floor or bed, until you feel a gentle stretch on the outside of your right / left thigh. If you do not feel a stretch and your knee will not fall farther, place the heel of your opposite foot on top of your knee, and pull your thigh down farther.  Hold this stretch for __________ seconds. Repeat __________ times. Complete this stretch __________ times per day. STRENGTHENING EXERCISES - Iliotibial Band Syndrome Improving the flexibility of the IT band will best relieve your discomfort due to IT band syndrome. Strengthening exercises, however, can help improve both muscle endurance and joint mechanics, reducing the factors that can contribute to this condition. Your physician, physical   therapist or athletic trainer may provide you with exercises that train specific muscle groups that are especially weak. The following exercises target muscles that are often weak in people who have IT band syndrome. STRENGTH - Hip Abductors, Straight Leg Raises  Be aware of your form throughout the entire exercise, so that you exercise the correct muscles. Poor form means that you are not strengthening the correct muscles.  Lie on your side, so that your head, shoulders, knee and hip line up. You may bend your lower knee to help maintain your balance. Your right / left leg should be on top.  Roll your hips  slightly forward, so that your hips are stacked directly over each other and your right / left knee is facing forward.  Lift your top leg up 4-6 inches, leading with your heel. Be sure that your foot does not drift forward and that your knee does not roll toward the ceiling.  Hold this position for __________ seconds. You should feel the muscles in your outer hip lifting (you may not notice this until your leg begins to tire).  Slowly lower your leg to the starting position. Allow the muscles to fully relax before beginning the next repetition. Repeat __________ times. Complete this exercise __________ times per day.  STRENGTH - Quad/VMO, Isometric  Sit in a chair with your right / left knee slightly bent. With your fingertips, feel the VMO muscle (just above the inside of your knee). The VMO is important in controlling the position of your kneecap.  Keeping your fingertips on this muscle. Without actually moving your leg, attempt to drive your knee down, as if straightening your leg. You should feel your VMO tense. If you have a difficult time, you may wish to try the same exercise on your healthy knee first.  Tense this muscle as hard as you can, without increasing any knee pain.  Hold for __________ seconds. Relax the muscles slowly and completely between each repetition. Repeat __________ times. Complete this exercise __________ times per day.    This information is not intended to replace advice given to you by your health care provider. Make sure you discuss any questions you have with your health care provider.   Document Released: 09/29/2005 Document Revised: 10/20/2014 Document Reviewed: 01/11/2009 Elsevier Interactive Patient Education 2016 [employer]  

## 2015-12-20 ENCOUNTER — Other Ambulatory Visit: Payer: Self-pay | Admitting: Physician Assistant

## 2015-12-20 LAB — SEDIMENTATION RATE: Sed Rate: 4 mm/hr (ref 0–30)

## 2015-12-20 MED ORDER — GLIPIZIDE 5 MG PO TABS: ORAL_TABLET | ORAL | Status: DC

## 2016-01-15 ENCOUNTER — Other Ambulatory Visit: Payer: Self-pay | Admitting: Physician Assistant

## 2016-02-18 ENCOUNTER — Other Ambulatory Visit: Payer: Self-pay | Admitting: Internal Medicine

## 2016-03-18 ENCOUNTER — Other Ambulatory Visit: Payer: Self-pay | Admitting: Internal Medicine

## 2016-03-28 ENCOUNTER — Encounter: Payer: Self-pay | Admitting: Physician Assistant

## 2016-03-28 ENCOUNTER — Ambulatory Visit (INDEPENDENT_AMBULATORY_CARE_PROVIDER_SITE_OTHER): Payer: 59 | Admitting: Physician Assistant

## 2016-03-28 VITALS — BP 126/66 | HR 91 | Temp 97.5°F | Resp 16 | Ht 63.5 in | Wt 177.6 lb

## 2016-03-28 DIAGNOSIS — E782 Mixed hyperlipidemia: Secondary | ICD-10-CM | POA: Diagnosis not present

## 2016-03-28 DIAGNOSIS — E119 Type 2 diabetes mellitus without complications: Secondary | ICD-10-CM

## 2016-03-28 DIAGNOSIS — Z720 Tobacco use: Secondary | ICD-10-CM

## 2016-03-28 DIAGNOSIS — E559 Vitamin D deficiency, unspecified: Secondary | ICD-10-CM

## 2016-03-28 DIAGNOSIS — Z79899 Other long term (current) drug therapy: Secondary | ICD-10-CM

## 2016-03-28 MED ORDER — ROSUVASTATIN CALCIUM 20 MG PO TABS: 20.0000 mg | ORAL_TABLET | Freq: Every day | ORAL | Status: DC

## 2016-03-28 NOTE — Progress Notes (Signed)
3 MONTH FOLLOW UP 1. Type 2 diabetes mellitus without complication, without long-term current use of insulin (HCC) Discussed general issues about diabetes pathophysiology and management., Educational material distributed., Suggested low cholesterol diet., Encouraged aerobic exercise., Discussed foot care., Reminded to get yearly retinal exam. Suppose to start on glipizide but has not started on it, confusion with the labs - She will call the office from now on and not text her family members.   2. Mixed hyperlipidemia -continue medications, check lipids, decrease fatty foods, increase activity.  - CRESTOR  1/2 3 DAYS A WEEK  3. Tobacco abuse -Smoking cessation-  counseled patient on the dangers of tobacco use, advised patient to stop smoking, and reviewed strategies to maximize success, continue to try  4. Vitamin D deficiency Will try liquid vitamin d  5. Medication management  FOLLOW UP 1 MONTH FOR LABS AT THAT TIME Continue diet and meds as discussed. Further disposition pending results of labs. Discussed med's effects and SE's.    HPI 60 y.o. female  Presents for 3 month fu for uncontrolled DM and chol.   Her blood pressure has been controlled at home, today her BP is BP: 126/66 mmHg.  She does not workout. She denies chest pain, shortness of breath, dizziness.  She is not on cholesterol medication and denies myalgias. Her cholesterol is not at goal. The cholesterol was:   Lab Results  Component Value Date   CHOL 283* 10/25/2015   HDL 36* 10/25/2015   LDLCALC 175* 10/25/2015   TRIG 361* 10/25/2015   CHOLHDL 7.9* 10/25/2015    She has uncontrolled DM without complications, she is on bASA, she is not on ACE/ARB due to hypotension, on farxiga, does check sugars at home, has been 200's in the morning, she was suppose to start glipzide but never picked it up and denies  paresthesia of the feet, polydipsia, polyuria and visual disturbances. Last A1C was:  Lab Results  Component  Value Date   HGBA1C 10.0* 10/25/2015   Patient is on Vitamin D supplement. Lab Results  Component Value Date   VD25OH 9* 10/25/2015   She is a smoker wants to quit for new grandbaby, however when her mom passed she started back smoking.   She is on celexa for anxiety.  She has allergies, is on claritin and singulair.   Current Medications:  Current Outpatient Prescriptions on File Prior to Visit  Medication Sig Dispense Refill  . aspirin EC 81 MG tablet Take 81 mg by mouth daily.    . citalopram (CELEXA) 20 MG tablet TAKE 1 TABLET EACH DAY. 30 tablet 0  . FARXIGA 5 MG TABS tablet TAKE 1 TABLET DAILY. 30 tablet 0  . glipiZIDE (GLUCOTROL) 5 MG tablet 1 time daily with largest meal 60 tablet 0  . glucose blood (ONE TOUCH ULTRA TEST) test strip Check sugar 3 x daily or as directed 100 each 2  . meloxicam (MOBIC) 15 MG tablet Take one daily with food for 2 weeks, can take with tylenol, can not take with aleve, iburpofen, then as needed daily for pain 30 tablet 1  . montelukast (SINGULAIR) 10 MG tablet     . ondansetron (ZOFRAN ODT) 8 MG disintegrating tablet  ODT q4 hours prn nausea 20 tablet 0   No current facility-administered medications on file prior to visit.   Medical History:  Past Medical History  Diagnosis Date  . Diabetes mellitus without complication (HCC)   . Anxiety   . Hypocholesteremia   . Tobacco  abuse   . Hyperlipidemia   . Hx of colonic polyps     Fhx Colon cancer   Allergies:  Allergies  Allergen Reactions  . Levaquin [Levofloxacin] Hives and Other (See Comments)    Tingling sensation to mouth, near LOC, blurred vision,    . Eggs Or Egg-Derived Products     Unknown reaction   . Iodine     Unknown reaction   . Metformin And Related Diarrhea  . Shellfish Allergy     Unknown reaction   . Vitamin D Analogs Diarrhea    High dose Vitamin D caused diarrhea  . Amoxicillin Rash  . Kombiglyze [Saxagliptin-Metformin Er] Other (See Comments)    Bloating  .  Latex Itching and Rash  . Penicillins Rash    Review of Systems:  Review of Systems  Constitutional: Negative.   HENT: Positive for congestion.   Eyes: Negative.   Respiratory: Positive for cough. Negative for hemoptysis, sputum production, shortness of breath and wheezing.   Cardiovascular: Negative.   Gastrointestinal: Negative for diarrhea.  Genitourinary: Negative.   Musculoskeletal: Negative.   Skin: Negative.   Neurological: Negative.   Endo/Heme/Allergies: Negative.   Psychiatric/Behavioral: Negative.     Physical Exam: BP 126/66 mmHg  Pulse 91  Temp(Src) 97.5 F (36.4 C) (Temporal)  Resp 16  Ht 5' 3.5" (1.613 m)  Wt 177 lb 9.6 oz (80.559 kg)  BMI 30.96 kg/m2  SpO2 96% Wt Readings from Last 3 Encounters:  03/28/16 177 lb 9.6 oz (80.559 kg)  12/19/15 176 lb 9.6 oz (80.105 kg)  11/08/15 178 lb (80.74 kg)   General Appearance: Well nourished, in no apparent distress. Eyes: PERRLA, EOMs, conjunctiva no swelling or erythema Sinuses: No Frontal/maxillary tenderness ENT/Mouth: Ext aud canals clear, TMs without erythema, bulging. No erythema, swelling, or exudate on post pharynx.  Tonsils not swollen or erythematous. Hearing normal.  Neck: Supple, thyroid normal.  Respiratory: Respiratory effort normal, BS equal bilaterally without rales, rhonchi, wheezing or stridor.  Cardio: RRR with no MRGs. Brisk peripheral pulses without edema.  Abdomen: Soft, + BS.  Non tender, no guarding, rebound, hernias, masses. Lymphatics: Non tender without lymphadenopathy.  Musculoskeletal: Full ROM, 5/5 strength, Normal gait Skin: Warm, dry without rashes, lesions, ecchymosis.  Neuro: Cranial nerves intact. No cerebellar symptoms.  Psych: Awake and oriented X 3, normal affect, Insight and Judgment appropriate.   Quentin MullingAmanda Zada Haser, PA-C 11:23 AM [facility]

## 2016-03-28 NOTE — Patient Instructions (Addendum)
  Vitamin D goal is between 60-80  Please make sure that you are taking your Vitamin D as directed.   It is very important as a natural anti-inflammatory   helping hair, skin, and nails, as well as reducing stroke and heart attack risk.   It helps your bones and helps with mood.  It also decreases numerous cancer risks so please take it as directed.   Low Vit D is associated with a 200-300% higher risk for CANCER   and 200-300% higher risk for HEART   ATTACK  &  STROKE.    .....................................Marland Kitchen.  It is also associated with higher death rate at younger ages,   autoimmune diseases like Rheumatoid arthritis, Lupus, Multiple Sclerosis.     Also many other serious conditions, like depression, Alzheimer's  Dementia, infertility, muscle aches, fatigue, fibromyalgia - just to name a few.  +++++++++++++++++++  Can get liquid vitamin D from [facility] OR [place] at  [facility] [address], [place], [place] [id number] Get on 5000 IU daily  Get on crestor 1/2 pill 3 days a week Get on glipizide with largest meal Follow up 1 month for OV     Bad carbs also include fruit juice, alcohol, and sweet tea. These are empty calories that do not signal to your brain that you are full.   Please remember the good carbs are still carbs which convert into sugar. So please measure them out no more than 1/2-1 cup of rice, oatmeal, pasta, and beans  Veggies are however free foods! Pile them on.   Not all fruit is created equal. Please see the list below, the fruit at the bottom is higher in sugars than the fruit at the top. Please avoid all dried fruits.

## 2016-04-18 ENCOUNTER — Other Ambulatory Visit: Payer: Self-pay | Admitting: Internal Medicine

## 2016-04-18 ENCOUNTER — Other Ambulatory Visit: Payer: Self-pay | Admitting: Physician Assistant

## 2016-04-30 ENCOUNTER — Ambulatory Visit: Payer: Self-pay | Admitting: Physician Assistant

## 2016-05-20 ENCOUNTER — Other Ambulatory Visit: Payer: Self-pay | Admitting: Internal Medicine

## 2016-06-04 ENCOUNTER — Ambulatory Visit: Payer: Self-pay | Admitting: Physician Assistant

## 2016-06-06 ENCOUNTER — Encounter (HOSPITAL_COMMUNITY): Payer: Self-pay

## 2016-06-06 ENCOUNTER — Emergency Department (HOSPITAL_COMMUNITY)
Admission: EM | Admit: 2016-06-06 | Discharge: 2016-06-06 | Disposition: A | Discharge status: Home / Self Care | Payer: 59 | Attending: Emergency Medicine | Admitting: Emergency Medicine

## 2016-06-06 DIAGNOSIS — Z91040 Latex allergy status: Secondary | ICD-10-CM | POA: Diagnosis not present

## 2016-06-06 DIAGNOSIS — F17210 Nicotine dependence, cigarettes, uncomplicated: Secondary | ICD-10-CM | POA: Insufficient documentation

## 2016-06-06 DIAGNOSIS — Z7984 Long term (current) use of oral hypoglycemic drugs: Secondary | ICD-10-CM | POA: Diagnosis not present

## 2016-06-06 DIAGNOSIS — E1165 Type 2 diabetes mellitus with hyperglycemia: Secondary | ICD-10-CM | POA: Diagnosis not present

## 2016-06-06 DIAGNOSIS — Z7982 Long term (current) use of aspirin: Secondary | ICD-10-CM | POA: Diagnosis not present

## 2016-06-06 DIAGNOSIS — R739 Hyperglycemia, unspecified: Secondary | ICD-10-CM

## 2016-06-06 LAB — URINALYSIS, ROUTINE W REFLEX MICROSCOPIC
Bilirubin Urine: NEGATIVE
Glucose, UA: >1000 mg/dL — AB
HGB URINE DIPSTICK: NEGATIVE
Ketones, ur: 15 mg/dL — AB
Leukocytes, UA: NEGATIVE
Nitrite: NEGATIVE
Protein, ur: NEGATIVE mg/dL
SPECIFIC GRAVITY, URINE: 1.015 (ref 1.005–1.030)
pH: 5 (ref 5.0–8.0)

## 2016-06-06 LAB — BASIC METABOLIC PANEL
ANION GAP: 8 (ref 5–15)
BUN: 18 mg/dL (ref 6–20)
CALCIUM: 9.3 mg/dL (ref 8.9–10.3)
CO2: 25 mmol/L (ref 22–32)
CREATININE: 0.73 mg/dL (ref 0.44–1.00)
Chloride: 103 mmol/L (ref 101–111)
GLUCOSE: 272 mg/dL — AB (ref 65–99)
Potassium: 4.2 mmol/L (ref 3.5–5.1)
Sodium: 136 mmol/L (ref 135–145)

## 2016-06-06 LAB — CBG MONITORING, ED
GLUCOSE-CAPILLARY: 306 mg/dL — AB (ref 65–99)
Glucose-Capillary: 253 mg/dL — ABNORMAL HIGH (ref 65–99)
Glucose-Capillary: 179 mg/dL — ABNORMAL HIGH (ref 65–99)

## 2016-06-06 LAB — CBC
HCT: 45.1 % (ref 36.0–46.0)
HEMOGLOBIN: 15.3 g/dL — AB (ref 12.0–15.0)
MCH: 31.2 pg (ref 26.0–34.0)
MCHC: 33.9 g/dL (ref 30.0–36.0)
MCV: 92 fL (ref 78.0–100.0)
Platelets: 202 10*3/uL (ref 150–400)
RBC: 4.9 MIL/uL (ref 3.87–5.11)
RDW: 12.4 % (ref 11.5–15.5)
WBC: 8.1 10*3/uL (ref 4.0–10.5)

## 2016-06-06 LAB — URINE MICROSCOPIC-ADD ON

## 2016-06-06 MED ORDER — SODIUM CHLORIDE 0.9 % IV BOLUS (SEPSIS)
2000.0000 mL | Freq: Once | INTRAVENOUS | Status: AC
  Administered 2016-06-06: 2000 mL via INTRAVENOUS

## 2016-06-06 NOTE — ED Provider Notes (Signed)
[facility]EMER[facility]ENCY DEPT Provider Note   CSN: [id number] Arrival date & time: 06/06/16  0030     History   Chief Complaint Chief Complaint  Patient presents with  . Hyperglycemia    HPI Candice [name] is a 60 y.o. female.  Patient with a history of DM presents today with a chief complaint of elevated blood sugar.  She states that her blood sugar has been running between 190-200 over the past week.  However, tonight when she checked it the blood sugar was 271, which prompted her to come in.  She reports an associated slight headache, nausea, and dizziness. She denies vomiting, abdominal pain, fever, chills, or any other symptoms.  She is currently on Farxiga for her DM and states that she has been taking it as directed.  Last dose was 11:30 PM.  She states that her PCP recently added Glipizide to her regimen, but she stopped it due to side effects.  She denies abdominal pain, vomiting, headache, or an other symptoms.        Past Medical History:  Diagnosis Date  . Anxiety   . Diabetes mellitus without complication (HCC)   . Hx of colonic polyps    Fhx Colon cancer  . Hyperlipidemia   . Hypocholesteremia   . Tobacco abuse     Patient Active Problem List   Diagnosis Date Noted  . Encounter for general adult medical examination with abnormal findings 10/25/2015  . Generalized anxiety disorder 12/25/2014  . Vitamin D deficiency 12/25/2014  . Medication management 12/25/2014  . Mixed hyperlipidemia 10/22/2013  . Back pain 03/31/2013  . Diabetes (HCC) 03/31/2013  . Tobacco abuse     Past Surgical History:  Procedure Laterality Date  . APPENDECTOMY    . CESAREAN SECTION    . CHOLECYSTECTOMY    . TONSILLECTOMY      OB History    No data available       Home Medications    Prior to Admission medications   Medication Sig Start Date End Date Taking? Authorizing Provider  aspirin EC 81 MG tablet Take 81 mg by mouth daily.    Historical Provider, MD  citalopram  (CELEXA) 20 MG tablet TAKE 1 TABLET EACH DAY. 05/20/16   Quentin MullingAmanda Collier, PA-C  FARXIGA 5 MG TABS tablet TAKE 1 TABLET DAILY. 05/20/16   Quentin MullingAmanda Collier, PA-C  glipiZIDE (GLUCOTROL) 5 MG tablet 1 time daily with largest meal 12/20/15 12/19/16  Quentin MullingAmanda Collier, PA-C  glucose blood (ONE TOUCH ULTRA TEST) test strip Check sugar 3 x daily or as directed 02/20/15   Lucky CowboyWilliam McKeown, MD  meloxicam [facility](MOBIC) 15 MG tablet Take one daily with food for 2 weeks, can take with tylenol, can not take with aleve, iburpofen, then as needed daily for pain 12/19/15   Quentin MullingAmanda Collier, PA-C  montelukast (SINGULAIR) 10 MG tablet TAKE ONE TABLET AT BEDTIME. 04/18/16   Lucky CowboyWilliam McKeown, MD  ondansetron (ZOFRAN ODT) 8 MG disintegrating tablet 8mg  ODT q4 hours prn nausea 10/04/15   Courtney Forcucci, PA-C  rosuvastatin (CRESTOR) 20 MG tablet Take 1 tablet (20 mg total) by mouth at bedtime. 03/28/16 03/28/17  Quentin MullingAmanda Collier, PA-C    Family History Family History  Problem Relation Age of Onset  . AAA (abdominal aortic aneurysm) Mother   . Hyperlipidemia Mother   . Heart attack Brother 55  . Pneumonia Maternal Grandmother   . Cancer Sister     Colon  . Diabetes Sister   . Heart disease Sister  Social History Social History  Substance Use Topics  . Smoking status: Current Some Day Smoker    Packs/day: 0.50    Years: 30.00    Types: Cigarettes  . Smokeless tobacco: Never Used     Comment: working on quitting  . Alcohol use No     Allergies   Levaquin [levofloxacin]; Eggs or egg-derived products; Iodine; Metformin and related; Shellfish allergy; Vitamin d analogs; Amoxicillin; Kombiglyze [saxagliptin-metformin er]; Latex; and Penicillins   Review of Systems Review of Systems  All other systems reviewed and are negative.    Physical Exam Updated Vital Signs BP 118/59   Pulse 69   Temp 98.1 F (36.7 C) (Oral)   Resp 18   Ht 5\' 5"  (1.651 m)   Wt 78.9 kg   SpO2 99%   BMI 28.96 kg/m   Physical Exam    Constitutional: She appears well-developed and well-nourished.  HENT:  Head: Normocephalic and atraumatic.  Mouth/Throat: Oropharynx is clear and moist.  Eyes: Pupils are equal, round, and reactive to light.  Neck: Normal range of motion. Neck supple.  Cardiovascular: Normal rate, regular rhythm and normal heart sounds.   Pulmonary/Chest: Effort normal and breath sounds normal.  Abdominal: Soft. Bowel sounds are normal. She exhibits no distension and no mass. There is no tenderness. There is no rebound and no guarding. No hernia.  Musculoskeletal: Normal range of motion.  Neurological: She is alert. She has normal strength. No cranial nerve deficit or sensory deficit. Gait normal.  Skin: Skin is warm and dry.  Psychiatric: She has a normal mood and affect.  Nursing note and vitals reviewed.    ED Treatments / Results  Labs (all labs ordered are listed, but only abnormal results are displayed) Labs Reviewed  BASIC METABOLIC PANEL - Abnormal; Notable for the following:       Result Value   Glucose, Bld 272 (*)    All other components within normal limits  CBC - Abnormal; Notable for the following:    Hemoglobin 15.3 (*)    All other components within normal limits  URINALYSIS, ROUTINE W REFLEX MICROSCOPIC (NOT AT [facility]) - Abnormal; Notable for the following:    Glucose, UA >1000 (*)    Ketones, ur 15 (*)    All other components within normal limits  URINE MICROSCOPIC-ADD ON - Abnormal; Notable for the following:    Squamous Epithelial / LPF 0-5 (*)    Bacteria, UA RARE (*)    All other components within normal limits  CBG MONITORING, ED - Abnormal; Notable for the following:    Glucose-Capillary 306 (*)    All other components within normal limits  CBG MONITORING, ED - Abnormal; Notable for the following:    Glucose-Capillary 253 (*)    All other components within normal limits    EKG  EKG Interpretation None       Radiology No results  found.  Procedures Procedures (including critical care time)  Medications Ordered in ED Medications  sodium chloride 0.9 % bolus 2,000 mL (2,000 mLs Intravenous New Bag/Given 06/06/16 0209)     Initial Impression / Assessment and Plan / ED Course  I have reviewed the triage vital signs and the nursing notes.  Pertinent labs & imaging results that were available during my care of the patient were reviewed by me and considered in my medical decision making (see chart for details).  Clinical Course     Final Clinical Impressions(s) / ED Diagnoses   Final diagnoses:  None   Patient with a history of DM presents today with a chief complaint of hyperglycemia.  She has an elevated blood sugar of 306 upon arrival in the ED.  Anion gap of 8.  No signs of DKA.  Blood sugar came down in the ED after IVF.  Instructed patient to follow up with PCP regarding changes to DM medications.  Return precautions given.    New Prescriptions New Prescriptions   No medications on file     Santiago Glad, PA-C 06/07/16 2024    Tomasita Crumble, MD 06/08/16 1540

## 2016-06-06 NOTE — ED Triage Notes (Signed)
Pt sates that she has just not been feeling well since about 11pm, states her sugar is high in the 200's and that she has not been checking it normally. States that she "just dont feel good and off balance"

## 2016-06-11 ENCOUNTER — Other Ambulatory Visit: Payer: Self-pay | Admitting: Physician Assistant

## 2016-06-11 ENCOUNTER — Other Ambulatory Visit: Payer: Self-pay | Admitting: Internal Medicine

## 2016-06-30 ENCOUNTER — Encounter: Payer: Self-pay | Admitting: Physician Assistant

## 2016-06-30 ENCOUNTER — Ambulatory Visit (INDEPENDENT_AMBULATORY_CARE_PROVIDER_SITE_OTHER): Payer: 59 | Admitting: Physician Assistant

## 2016-06-30 VITALS — BP 130/64 | HR 81 | Temp 97.6°F | Resp 16 | Ht 63.5 in | Wt 178.0 lb

## 2016-06-30 DIAGNOSIS — H8112 Benign paroxysmal vertigo, left ear: Secondary | ICD-10-CM

## 2016-06-30 DIAGNOSIS — E119 Type 2 diabetes mellitus without complications: Secondary | ICD-10-CM

## 2016-06-30 MED ORDER — MECLIZINE HCL 25 MG PO TABS: ORAL_TABLET | ORAL | 0 refills | Status: DC

## 2016-06-30 MED ORDER — GLIPIZIDE 5 MG PO TABS: ORAL_TABLET | ORAL | 0 refills | Status: DC

## 2016-06-30 NOTE — Patient Instructions (Addendum)
Continue the farxiga Continue the glipizide 5mg  with dinner/food whenever that may be Next week start 1/2-1 pill in the morning breakfast in the morning Continue to monitor sugars, bring in numbers   Targets for Glucose Readings: Time of Check Usual Target for Most People  Before Meals  70-130  Two hours after meals  Less than 180  Bedtime  90-150   Why Should You Check Your Blood Glucose? -The A1C tells you how your diabetes is doing over a 3 month period.  Home blood glucose monitoring (or checking) gives you information about your diabetes on a daily basis.  You will learn how well your diabetes care plan is working and whether your blood glucose is in your target range throughout the day.   -Reviewing daily blood glucose levels will help you and your healthcare team make any needed changes to you meal plan, physical activity and medications.  Meter Supplies: - A glucose meter was provided at your visit today along with strips and lancets. The blood glucose meter strips and lancets are usually covered by health insurance. Please let us know if they are not and contact your insurance to see which meter they prefer.  How to get a good blood sample: -Wash your hands in warm water.  You do not need to use alcohol wipes. -Massage your hands. -Choose which finger you will use.  It helps to use a different finger each time to avoid soreness. -Keep you hand below your wrist when using you lancet to "prick" your finger. -Apply gentle pressure, but do NOT squeeze your finger.  Checking your blood glucose Important Reminders: -Make sure your strips are not expired.  Check the date on the bottle. -Make sure the code on bottle matches the code on your machine. -Make sure your hands are clean and dry. -Do not use the center of your finger, it is the most sensitive area.  Use a spot to the side of the center of your fingertip. -Completely fill the strip target area with blood (until it beeps) to  make sure the results are accurate. -You will need a prescription to have your glucose strips and lancets covered by insurance. -There is usually an 800 number on the back of your meter for help with meter issues.  How often to check: -If you take diabetes pills or take one injection of insulin each day, you will usually be asked to check twice a day, before breakfast and 2 hours after one meal, or as directed. -If you take several insulin injections each day, you will usually be asked to check four times a day before meals and at bedtime every day.  Benign Paroxysmal Positional Vertigo (BPPV)  General Information In Benign Paroxysmal Positional Vertigo (BPPV) dizziness is generally thought to be due to debris which has collected within a part of the inner ear. This debris can be thought of as "ear rocks", although the formal name is "otoconia". Ear rocks are small crystals of calcium carbonate derived from a structure in the ear called the "utricle" (figure to the right ). The symptoms of BPPV include dizziness or vertigo, lightheadedness, imbalance, and nausea. Activities which bring on symptoms will vary among persons, but symptoms are usually followed by a change of position of the head like getting out of bed or rolling over in bed are common "problem" motions .  However if you have worsening HA, changes vision/speech, weakness go to the ER     What can be done? 1)  Use two or more pillows at night. Avoid sleeping on the "bad" side. In the morning, get up slowly and sit on the edge of the bed for a minute. 2) Medication prescribed by your doctor.  3) The exercises below, you can do at home to help you prevent the sensation later in the day.  4) We can [facility], # [phone]  Home treatments: The Brandt-Daroff Exercises are a home method of treating BPPV,and are effective 95% of the time.  These exercises are performed in three sets  per day for two weeks. In each set, one performs the maneuver as shown five times. Start sitting upright (position 1). Then move into the side-lying position (position 2), with the head angled upward about halfway. An easy way to remember this is to imagine someone standing about 6 feet in front of you, and just keep looking at their head at all times. Stay in the side-lying position for 30 seconds, or until the dizziness subsides if this is longer, then go back to the sitting position (position 3). Stay there for 30 seconds, and then go to the opposite side (position 4) and follow the same routine.  At home Epley Maneuver This procedure seems to be even more effective than the in-office procedure, perhaps because it is repeated every night for a week.  The method (for the left side) is performed as shown on the figure below.  1) One stays in each of the supine (lying down) positions for 30 seconds, and in the sitting upright position (top) for 1 minute.  2) Thus, once cycle takes 2 1/2 minutes.  3) Typically 3 cycles are performed just prior to going to sleep.  4) It is best to do them at night rather than in the morning or midday, as if one becomes dizzy following the exercises, then it can resolve while one is sleeping.  The mirror image of this procedure is used for the right ear.   .Marland Kitchen

## 2016-06-30 NOTE — Progress Notes (Signed)
Assessment and Plan: 1. Type 2 diabetes mellitus without complication, without long-term current use of insulin (HCC) - glipiZIDE (GLUCOTROL) 5 MG tablet; TAKE 1/2-1 TABLET TWICE DAILY WITH LARGEST MEAL.  Dispense: 60 tablet; Refill: 0  2. Benign paroxysmal positional vertigo, left - meclizine (ANTIVERT) 25 MG tablet; 1/2-1 pill up to 3 times daily for motion sickness/dizziness  Dispense: 30 tablet; Refill: 0 - normal neuro, stop smoking,  if any worsening HA, changes vision/speech, imbalance, weakness go to the ER    HPI 60 y.o.female with history of DM2, chol, tobacco use presents for follow up from the ER. Patient went to the ER on 06/06/16, for hyperglycemia. Her sugar was in the 200-300's with HA, NA, and dizziness.  A1C was 15.3, ketones 15 in urine, anion gap of 8 with no signs of DKA.  She is on farxiga but not on the glipizide, she never took it due to being afraid of low sugars, she has been taking glipizide 5mg .  She has been walking more, eating better. She has been checking her sugars at home, has been at 160's-170's.    Had vertigo in the AM, while turning over in the bed, lasted x 3 hours with nausea. Has history of vertigo in the past. Worse with turning head.    Past Medical History:  Diagnosis Date  . Anxiety   . Diabetes mellitus without complication (HCC)   . Hx of colonic polyps    Fhx Colon cancer  . Hyperlipidemia   . Hypocholesteremia   . Tobacco abuse      Allergies  Allergen Reactions  . Levaquin [Levofloxacin] Hives and Other (See Comments)    Tingling sensation to mouth, near LOC, blurred vision,    . Eggs Or Egg-Derived Products     Unknown reaction   . Iodine     Unknown reaction   . Metformin And Related Diarrhea  . Shellfish Allergy     Unknown reaction   . Vitamin D Analogs Diarrhea    High dose Vitamin D caused diarrhea  . Amoxicillin Rash  . Kombiglyze [Saxagliptin-Metformin Er] Other (See Comments)    Bloating  . Latex Itching and Rash   . Penicillins Rash      Current Outpatient Prescriptions on File Prior to Visit  Medication Sig Dispense Refill  . aspirin EC 81 MG tablet Take 81 mg by mouth daily.    . citalopram (CELEXA) 20 MG tablet TAKE 1 TABLET EACH DAY. 30 tablet 0  . FARXIGA 5 MG TABS tablet TAKE 1 TABLET DAILY. 30 tablet 0  . glipiZIDE (GLUCOTROL) 5 MG tablet TAKE 1 TABLET ONCE DAILY WITH LARGEST MEAL. 30 tablet 0  . meloxicam (MOBIC) 15 MG tablet Take one daily with food for 2 weeks, can take with tylenol, can not take with aleve, iburpofen, then as needed daily for pain 30 tablet 1  . montelukast (SINGULAIR) 10 MG tablet TAKE ONE TABLET AT BEDTIME. 30 tablet 0  . ondansetron (ZOFRAN ODT) 8 MG disintegrating tablet 8mg  ODT q4 hours prn nausea 20 tablet 0  . ONE TOUCH ULTRA TEST test strip USE TO TEST BLOOD SUGAR 3 TIMES DAILY OR AS DIRECTED. 25 each 0  . rosuvastatin (CRESTOR) 20 MG tablet Take 1 tablet (20 mg total) by mouth at bedtime. 30 tablet 11   No current facility-administered medications on file prior to visit.     ROS: all negative except above.   Physical Exam: Filed Weights   06/30/16 1132  Weight: 178 lb (  80.7 kg)   BP 130/64   Pulse 81   Temp 97.6 F (36.4 C)   Resp 16   Ht 5' 3.5" (1.613 m)   Wt 178 lb (80.7 kg)   SpO2 97%   BMI 31.04 kg/m  General Appearance: Well nourished, in no apparent distress. Eyes: PERRLA, EOMs, conjunctiva no swelling or erythema Sinuses: No Frontal/maxillary tenderness ENT/Mouth: Ext aud canals clear, TMs without erythema, bulging. No erythema, swelling, or exudate on post pharynx.  Tonsils not swollen or erythematous. Hearing normal.  Neck: Supple, thyroid normal.  Respiratory: Respiratory effort normal, BS equal bilaterally without rales, rhonchi, wheezing or stridor.  Cardio: RRR with no MRGs. Brisk peripheral pulses without edema.  Abdomen: Soft, + BS.  Non tender, no guarding, rebound, hernias, masses. Lymphatics: Non tender without  lymphadenopathy.  Musculoskeletal: Full ROM, 5/5 strength, normal gait.  Skin: Warm, dry without rashes, lesions, ecchymosis.  Neuro: Cranial nerves intact. Normal muscle tone, no cerebellar symptoms. Sensation intact.  Psych: Awake and oriented X 3, normal affect, Insight and Judgment appropriate.     Quentin Mulling, PA-C 11:44 AM [facility]

## 2016-07-18 ENCOUNTER — Other Ambulatory Visit: Payer: Self-pay | Admitting: Physician Assistant

## 2016-07-31 ENCOUNTER — Ambulatory Visit (INDEPENDENT_AMBULATORY_CARE_PROVIDER_SITE_OTHER): Payer: 59 | Admitting: Internal Medicine

## 2016-07-31 ENCOUNTER — Encounter: Payer: Self-pay | Admitting: Internal Medicine

## 2016-07-31 VITALS — BP 118/60 | HR 72 | Temp 98.0°F | Resp 18 | Ht 63.5 in | Wt 178.0 lb

## 2016-07-31 DIAGNOSIS — IMO0001 Reserved for inherently not codable concepts without codable children: Secondary | ICD-10-CM

## 2016-07-31 DIAGNOSIS — E1165 Type 2 diabetes mellitus with hyperglycemia: Secondary | ICD-10-CM

## 2016-07-31 LAB — COMPREHENSIVE METABOLIC PANEL
ALT: 17 U/L (ref 6–29)
AST: 14 U/L (ref 10–35)
Albumin: 4.4 g/dL (ref 3.6–5.1)
Alkaline Phosphatase: 103 U/L (ref 33–130)
BUN: 16 mg/dL (ref 7–25)
CALCIUM: 9.7 mg/dL (ref 8.6–10.4)
CHLORIDE: 105 mmol/L (ref 98–110)
CO2: 23 mmol/L (ref 20–31)
Creat: 0.75 mg/dL (ref 0.50–1.05)
GLUCOSE: 225 mg/dL — AB (ref 65–99)
POTASSIUM: 4.6 mmol/L (ref 3.5–5.3)
Sodium: 140 mmol/L (ref 135–146)
Total Bilirubin: 0.5 mg/dL (ref 0.2–1.2)
Total Protein: 7.1 g/dL (ref 6.1–8.1)

## 2016-07-31 MED ORDER — MONTELUKAST SODIUM 10 MG PO TABS: 10.0000 mg | ORAL_TABLET | Freq: Every day | ORAL | 0 refills | Status: DC

## 2016-07-31 MED ORDER — FLUTICASONE PROPIONATE 50 MCG/ACT NA SUSP: 2.0000 | Freq: Every day | NASAL | 2 refills | Status: DC

## 2016-07-31 MED ORDER — CITALOPRAM HYDROBROMIDE 20 MG PO TABS: ORAL_TABLET | ORAL | 0 refills | Status: DC

## 2016-07-31 MED ORDER — GLIPIZIDE 5 MG PO TABS: ORAL_TABLET | ORAL | 0 refills | Status: DC

## 2016-07-31 NOTE — Progress Notes (Signed)
Assessment and Plan:   1. Uncontrolled type 2 diabetes mellitus without complication, without long-term current use of insulin (HCC) -sugars continue to be elevated although improved.   -will consider changing to farxiga 10 mg.   - glipiZIDE (GLUCOTROL) 5 MG tablet; TAKE 1/2-1 TABLET TWICE DAILY WITH LARGEST MEAL.  Dispense: 60 tablet; Refill: 0 - Urinalysis, Routine w reflex microscopic (not at [facility]) - Hemoglobin A1c - Comprehensive metabolic panel     HPI 60 y.o.female presents for 1 month follow up of uncontrolled diabetes.  She reports that she started glipizide since her last visit.  She has not had any hypoglycemia.  She is taking it with her dinner.  She takes the farxiga at bedtime.  She reports that blood sugars are running around the 160s. She has not noticed any ADRs.  She reports that she has been trying to do a better job with her eating.  She reports that she has been really working on cutting back on sweets.  She is also starting to walk every night.  She usually walks a couple blocks every night.  She does stop and talk to some folks.  She reports that she usually takes about 15 minutes to walk.     She is also having some ear pain and roaring.  She reports that she is taking the allegra and also some singulair.  She does not have any more dizziness with it.    Past Medical History:  Diagnosis Date  . Anxiety   . Diabetes mellitus without complication (HCC)   . Hx of colonic polyps    Fhx Colon cancer  . Hyperlipidemia   . Hypocholesteremia   . Tobacco abuse      Allergies  Allergen Reactions  . Levaquin [Levofloxacin] Hives and Other (See Comments)    Tingling sensation to mouth, near LOC, blurred vision,    . Eggs Or Egg-Derived Products     Unknown reaction   . Iodine     Unknown reaction   . Metformin And Related Diarrhea  . Shellfish Allergy     Unknown reaction   . Vitamin D Analogs Diarrhea    High dose Vitamin D caused diarrhea  . Amoxicillin Rash   . Kombiglyze [Saxagliptin-Metformin Er] Other (See Comments)    Bloating  . Latex Itching and Rash  . Penicillins Rash      Current Outpatient Prescriptions on File Prior to Visit  Medication Sig Dispense Refill  . aspirin EC 81 MG tablet Take 81 mg by mouth daily.    . citalopram (CELEXA) 20 MG tablet TAKE 1 TABLET EACH DAY. 30 tablet 0  . FARXIGA 5 MG TABS tablet TAKE 1 TABLET DAILY. 30 tablet 0  . glipiZIDE (GLUCOTROL) 5 MG tablet TAKE 1/2-1 TABLET TWICE DAILY WITH LARGEST MEAL. 60 tablet 0  . meclizine (ANTIVERT) 25 MG tablet 1/2-1 pill up to 3 times daily for motion sickness/dizziness 30 tablet 0  . meloxicam (MOBIC) 15 MG tablet Take one daily with food for 2 weeks, can take with tylenol, can not take with aleve, iburpofen, then as needed daily for pain 30 tablet 1  . montelukast (SINGULAIR) 10 MG tablet TAKE ONE TABLET AT BEDTIME. 30 tablet 0  . ondansetron (ZOFRAN ODT) 8 MG disintegrating tablet 8mg  ODT q4 hours prn nausea 20 tablet 0  . ONE TOUCH ULTRA TEST test strip USE TO TEST BLOOD SUGAR 3 TIMES DAILY OR AS DIRECTED. 25 each 0  . rosuvastatin (CRESTOR) 20 MG tablet Take  1 tablet (20 mg total) by mouth at bedtime. 30 tablet 11   No current facility-administered medications on file prior to visit.     ROS: all negative except above.   Physical Exam: Filed Weights   07/31/16 1133  Weight: 178 lb (80.7 kg)   BP 118/60   Pulse 72   Temp 98 F (36.7 C) (Temporal)   Resp 18   Ht 5' 3.5" (1.613 m)   Wt 178 lb (80.7 kg)   BMI 31.04 kg/m  General Appearance: Well developed well nourished, non-toxic appearing in no apparent distress. Eyes: PERRLA, EOMs, conjunctiva w/ no swelling or erythema or discharge Sinuses: No Frontal/maxillary tenderness ENT/Mouth: Ear canals clear without swelling or erythema.  TM's normal bilaterally with no retractions, bulging, or loss of landmarks.   Neck: Supple, thyroid normal, no notable JVD  Respiratory: Respiratory effort normal, Clear  breath sounds anteriorly and posteriorly bilaterally without rales, rhonchi, wheezing or stridor. No retractions or accessory muscle usage. Cardio: RRR with no MRGs.   Abdomen: Soft, + BS.  Non tender, no guarding, rebound, hernias, masses.  Musculoskeletal: Full ROM, 5/5 strength, normal gait.  Skin: Warm, dry without rashes  Neuro: Awake and oriented X 3, Cranial nerves intact. Normal muscle tone, no cerebellar symptoms. Sensation intact.  Psych: normal affect, Insight and Judgment appropriate.     Terri Piedraourtney Forcucci, PA-C 11:53 AM [facility]

## 2016-08-01 LAB — URINALYSIS, ROUTINE W REFLEX MICROSCOPIC

## 2016-08-01 LAB — HEMOGLOBIN A1C
HEMOGLOBIN A1C: 8.9 % — AB (ref <5.7)
MEAN PLASMA GLUCOSE: 209 mg/dL

## 2016-08-04 ENCOUNTER — Ambulatory Visit: Payer: Self-pay | Admitting: Physician Assistant

## 2016-08-18 ENCOUNTER — Other Ambulatory Visit: Payer: Self-pay | Admitting: Physician Assistant

## 2016-09-17 ENCOUNTER — Other Ambulatory Visit: Payer: Self-pay | Admitting: Internal Medicine

## 2016-09-23 ENCOUNTER — Ambulatory Visit (INDEPENDENT_AMBULATORY_CARE_PROVIDER_SITE_OTHER): Payer: 59 | Admitting: Physician Assistant

## 2016-09-23 VITALS — BP 110/74 | HR 82 | Temp 98.0°F | Resp 14 | Ht 65.0 in | Wt 182.0 lb

## 2016-09-23 DIAGNOSIS — J069 Acute upper respiratory infection, unspecified: Secondary | ICD-10-CM

## 2016-09-23 LAB — POC INFLUENZA A&B (BINAX/QUICKVUE)
INFLUENZA A, POC: NEGATIVE
Influenza B, POC: NEGATIVE

## 2016-09-23 MED ORDER — PROMETHAZINE-DM 6.25-15 MG/5ML PO SYRP: 5.0000 mL | ORAL_SOLUTION | Freq: Four times a day (QID) | ORAL | 1 refills | Status: DC | PRN

## 2016-09-23 MED ORDER — BENZONATATE 100 MG PO CAPS: 200.0000 mg | ORAL_CAPSULE | Freq: Three times a day (TID) | ORAL | 0 refills | Status: DC | PRN

## 2016-09-23 MED ORDER — PREDNISONE 20 MG PO TABS: ORAL_TABLET | ORAL | 0 refills | Status: DC

## 2016-09-23 MED ORDER — AZITHROMYCIN 250 MG PO TABS: ORAL_TABLET | ORAL | 1 refills | Status: AC

## 2016-09-23 NOTE — Patient Instructions (Signed)
Make sure you are on an allergy pill, see below for more details. Do the nasal spray and see bleow Please take the prednisone as directed below, this is NOT an antibiotic so you do NOT have to finish it. You can take it for a few days and stop it if you are doing better.   Please take the prednisone to help decrease inflammation and therefore decrease symptoms. Take it it with food to avoid GI upset. It can cause increased energy but on the other hand it can make it hard to sleep at night so please take it AT NIGHT WITH DINNER, it takes 8-12 hours to start working so it will NOT affect your sleeping if you take it at night with your food!!  If you are diabetic it will increase your sugars so decrease carbs and monitor your sugars closely.    Can do a steroid nasal spary 1-2 sparys at night each nostril. Remember to spray each nostril twice towards the outer part of your eye.  Do not sniff but instead pinch your nose and tilt your head back to help the medicine get into your sinuses.  The best time to do this is at bedtime. Stop if you get blurred vision or nose bleeds.    HOW TO TREAT VIRAL COUGH AND COLD SYMPTOMS:  -Symptoms usually last at least 1 week with the worst symptoms being around day 4.  - colds usually start with a sore throat and end with a cough, and the cough can take 2 weeks to get better.  -No antibiotics are needed for colds, flu, sore throats, cough, bronchitis UNLESS symptoms are longer than 7 days OR if you are getting better then get drastically worse.  -There are a lot of combination medications (Dayquil, Nyquil, Vicks 44, tyelnol cold and sinus, ETC). Please look at the ingredients on the back so that you are treating the correct symptoms and not doubling up on medications/ingredients.    Medicines you can use  Nasal congestion  - pseudoephedrine (Sudafed)- behind the counter, do not use if you have high blood pressure, medicine that have -D in them.  - phenylephrine  (Sudafed PE) -Dextormethorphan + chlorpheniramine (Coridcidin HBP)- okay if you have high blood pressure -Oxymetazoline (Afrin) nasal spray- LIMIT to 3 days -Saline nasal spray -Neti pot (used distilled or bottled water)  Ear pain/congestion  -pseudoephedrine (sudafed) - Nasonex/flonase nasal spray  Fever  -Acetaminophen (Tyelnol) -Ibuprofen (Advil, motrin, aleve)  Sore Throat  -Acetaminophen (Tyelnol) -Ibuprofen (Advil, motrin, aleve) -Drink a lot of water -Gargle with salt water - Rest your voice (don't talk) -Throat sprays -Cough drops  Body Aches  -Acetaminophen (Tyelnol) -Ibuprofen (Advil, motrin, aleve)  Headache  -Acetaminophen (Tyelnol) -Ibuprofen (Advil, motrin, aleve) - Exedrin, Exedrin Migraine  Allergy symptoms (cough, sneeze, runny nose, itchy eyes) -Claritin or loratadine cheapest but likely the weakest  -Zyrtec or certizine at night because it can make you sleepy -The strongest is allegra or fexafinadine  Cheapest at [employer], [employer], [employer]  Cough  -Dextromethorphan (Delsym)- medicine that has DM in it -Guafenesin (Mucinex/Robitussin) - cough drops - drink lots of water  Chest Congestion  -Guafenesin (Mucinex/Robitussin)  Red Itchy Eyes  - Naphcon-A  Upset Stomach  - Bland diet (nothing spicy, greasy, fried, and high acid foods like tomatoes, oranges, berries) -OKAY- cereal, bread, soup, crackers, rice -Eat smaller more frequent meals -reduce caffeine, no alcohol -Loperamide (Imodium-AD) if diarrhea -Prevacid for heart burn  General health when sick  -Hydration -wash your  hands frequently -keep surfaces clean -change pillow cases and sheets often -Get fresh air but do not exercise strenuously -Vitamin D, double up on it - Vitamin C -Zinc

## 2016-09-23 NOTE — Progress Notes (Signed)
Subjective:    Patient ID: Candice [name], female    DOB: 1956-05-30, 60 y.o.   MRN: [id number]  HPI 60 y.o. WF with history of DM presents with fever, chills, muscle aches x 2 days. Has had fever up to 101, sinus congestion, cough, achy, HA. Has been taking mucinex without help, has had some sick patients but does strict precautions. Lucila MaineGrandson is coming over tonight and would like to make sure it is not the flu.   Blood pressure 110/74, pulse 82, temperature 98 F (36.7 C), temperature source Temporal, resp. rate 14, height 5\' 5"  (1.651 m), weight 182 lb (82.6 kg), SpO2 98 %.  Medications Current Outpatient Prescriptions on File Prior to Visit  Medication Sig  . aspirin EC 81 MG tablet Take 81 mg by mouth daily.  . citalopram (CELEXA) 20 MG tablet TAKE 1 TABLET EACH DAY.  Marland Kitchen. FARXIGA 5 MG TABS tablet TAKE 1 TABLET DAILY.  . fluticasone (FLONASE) 50 MCG/ACT nasal spray Place 2 sprays into both nostrils daily.  Marland Kitchen. glipiZIDE (GLUCOTROL) 5 MG tablet TAKE 1/2-1 TABLET TWICE DAILY WITH LARGEST MEAL.  Marland Kitchen. meclizine (ANTIVERT) 25 MG tablet 1/2-1 pill up to 3 times daily for motion sickness/dizziness  . meloxicam (MOBIC) 15 MG tablet Take one daily with food for 2 weeks, can take with tylenol, can not take with aleve, iburpofen, then as needed daily for pain  . montelukast (SINGULAIR) 10 MG tablet Take 1 tablet (10 mg total) by mouth at bedtime.  . ondansetron (ZOFRAN ODT) 8 MG disintegrating tablet 8mg  ODT q4 hours prn nausea  . ONE TOUCH ULTRA TEST test strip USE TO TEST BLOOD SUGAR 3 TIMES DAILY OR AS DIRECTED.  Marland Kitchen. rosuvastatin (CRESTOR) 20 MG tablet Take 1 tablet (20 mg total) by mouth at bedtime.   No current facility-administered medications on file prior to visit.     Problem list She has Tobacco abuse; Back pain; Diabetes (HCC); Mixed hyperlipidemia; Generalized anxiety disorder; Vitamin D deficiency; Medication management; and Encounter for general adult medical examination with abnormal  findings on her problem list.   Review of Systems  Constitutional: Positive for chills and fever. Negative for diaphoresis.  HENT: Positive for congestion, postnasal drip, sinus pressure, sneezing and sore throat. Negative for ear pain.   Respiratory: Positive for cough. Negative for chest tightness, shortness of breath and wheezing.   Cardiovascular: Negative.   Gastrointestinal: Negative.   Genitourinary: Negative.   Musculoskeletal: Positive for myalgias. Negative for neck pain.  Neurological: Positive for headaches.       Objective:   Physical Exam  Constitutional: She appears well-developed and well-nourished.  HENT:  Head: Normocephalic and atraumatic.  Right Ear: External ear normal.  Nose: Right sinus exhibits maxillary sinus tenderness. Right sinus exhibits no frontal sinus tenderness. Left sinus exhibits maxillary sinus tenderness. Left sinus exhibits no frontal sinus tenderness.  Mouth/Throat: Posterior oropharyngeal erythema present. No oropharyngeal exudate, posterior oropharyngeal edema or tonsillar abscesses.  Eyes: Conjunctivae and EOM are normal.  Neck: Normal range of motion. Neck supple.  Cardiovascular: Normal rate, regular rhythm, normal heart sounds and intact distal pulses.   Pulmonary/Chest: Effort normal and breath sounds normal. No respiratory distress. She has no wheezes.  Abdominal: Soft. Bowel sounds are normal.  Lymphadenopathy:    She has no cervical adenopathy.  Skin: Skin is warm and dry.       Assessment & Plan:  1. Acute URI Flu negative Will hold the zpak, can not fill until 12/15, and take  if she is not getting better, increase fluids, rest, get on allergy pill/nasal spray - POC Influenza A&B(BINAX/QUICKVUE) - predniSONE (DELTASONE) 20 MG tablet; 2 tablets daily for 3 days, 1 tablet daily for 4 days.  Dispense: 10 tablet; Refill: 0 - benzonatate (TESSALON PERLES) 100 MG capsule; Take 2 capsules (200 mg total) by mouth 3 (three) times daily  as needed for cough (Max: 600mg  per day).  Dispense: 60 capsule; Refill: 0 - promethazine-dextromethorphan (PROMETHAZINE-DM) 6.25-15 MG/5ML syrup; Take 5 mLs by mouth 4 (four) times daily as needed for cough.  Dispense: 240 mL; Refill: 1 - azithromycin (ZITHROMAX) 250 MG tablet; Take 2 tablets (500 mg) on  Day 1,  followed by 1 tablet (250 mg) once daily on Days 2 through 5.  Dispense: 6 each; Refill: 1   Future Appointments Date Time Provider Department Center  11/05/2016 11:30 AM Quentin MullingAmanda Yosiel Thieme, PA-C [facility] None

## 2016-10-17 ENCOUNTER — Other Ambulatory Visit: Payer: Self-pay | Admitting: Physician Assistant

## 2016-11-04 ENCOUNTER — Ambulatory Visit (INDEPENDENT_AMBULATORY_CARE_PROVIDER_SITE_OTHER): Payer: 59 | Admitting: Physician Assistant

## 2016-11-04 ENCOUNTER — Encounter: Payer: Self-pay | Admitting: Physician Assistant

## 2016-11-04 VITALS — BP 116/78 | HR 63 | Temp 98.2°F | Resp 16 | Ht 65.0 in | Wt 179.0 lb

## 2016-11-04 DIAGNOSIS — E559 Vitamin D deficiency, unspecified: Secondary | ICD-10-CM

## 2016-11-04 DIAGNOSIS — Z720 Tobacco use: Secondary | ICD-10-CM | POA: Diagnosis not present

## 2016-11-04 DIAGNOSIS — E782 Mixed hyperlipidemia: Secondary | ICD-10-CM

## 2016-11-04 DIAGNOSIS — Z79899 Other long term (current) drug therapy: Secondary | ICD-10-CM

## 2016-11-04 DIAGNOSIS — Z124 Encounter for screening for malignant neoplasm of cervix: Secondary | ICD-10-CM | POA: Diagnosis not present

## 2016-11-04 DIAGNOSIS — Z01419 Encounter for gynecological examination (general) (routine) without abnormal findings: Secondary | ICD-10-CM | POA: Diagnosis not present

## 2016-11-04 DIAGNOSIS — E119 Type 2 diabetes mellitus without complications: Secondary | ICD-10-CM | POA: Diagnosis not present

## 2016-11-04 LAB — CBC WITH DIFFERENTIAL/PLATELET
BASOS ABS: 0 {cells}/uL (ref 0–200)
Basophils Relative: 0 %
EOS PCT: 1 %
Eosinophils Absolute: 72 cells/uL (ref 15–500)
HCT: 44.4 % (ref 35.0–45.0)
Hemoglobin: 15 g/dL (ref 11.7–15.5)
Lymphocytes Relative: 33 %
Lymphs Abs: 2376 cells/uL (ref 850–3900)
MCH: 31.2 pg (ref 27.0–33.0)
MCHC: 33.8 g/dL (ref 32.0–36.0)
MCV: 92.3 fL (ref 80.0–100.0)
MONOS PCT: 6 %
MPV: 10.2 fL (ref 7.5–12.5)
Monocytes Absolute: 432 cells/uL (ref 200–950)
NEUTROS ABS: 4320 {cells}/uL (ref 1500–7800)
NEUTROS PCT: 60 %
PLATELETS: 227 10*3/uL (ref 140–400)
RBC: 4.81 MIL/uL (ref 3.80–5.10)
RDW: 13.1 % (ref 11.0–15.0)
WBC: 7.2 10*3/uL (ref 3.8–10.8)

## 2016-11-04 MED ORDER — DAPAGLIFLOZIN PROPANEDIOL 10 MG PO TABS: 10.0000 mg | ORAL_TABLET | Freq: Every day | ORAL | 5 refills | Status: DC

## 2016-11-04 MED ORDER — CITALOPRAM HYDROBROMIDE 20 MG PO TABS: ORAL_TABLET | ORAL | 3 refills | Status: DC

## 2016-11-04 NOTE — Patient Instructions (Addendum)
  Vitamin D goal is between 60-80  Please make sure that you are taking your Vitamin D as directed.   It is very important as a natural anti-inflammatory   helping hair, skin, and nails, as well as reducing stroke and heart attack risk.   It helps your bones and helps with mood.  We want you on at least 5000 IU daily  It also decreases numerous cancer risks so please take it as directed.   Low Vit D is associated with a 200-300% higher risk for CANCER   and 200-300% higher risk for HEART   ATTACK  &  STROKE.    .....................................Marland Kitchen.  It is also associated with higher death rate at younger ages,   autoimmune diseases like Rheumatoid arthritis, Lupus, Multiple Sclerosis.     Also many other serious conditions, like depression, Alzheimer's  Dementia, infertility, muscle aches, fatigue, fibromyalgia - just to name a few.  +++++++++++++++++++  Can get liquid vitamin D from [place]  OR [place] in [place] at  [facility] [address], [place], [place] [id number] Or you can try earth fare       Bad carbs also include fruit juice, alcohol, and sweet tea. These are empty calories that do not signal to your brain that you are full.   Please remember the good carbs are still carbs which convert into sugar. So please measure them out no more than 1/2-1 cup of rice, oatmeal, pasta, and beans  Veggies are however free foods! Pile them on.   Not all fruit is created equal. Please see the list below, the fruit at the bottom is higher in sugars than the fruit at the top. Please avoid all dried fruits.    Please be careful with glipizide (Glucotrol). This medication forces your blood sugar down no matter what it is starting at. Only take 1/2 of the medication if your sugar is above 120 and you can take a whole pill if your sugar is above 150 in the morning. If at any time you start to have low blood sugars in the morning or during the day please stop this medication.  Please never take this medication if you are sick or can not eat. A low blood sugar is much more dangerous than a high blood sugar. Your brain needs two things, sugar and oxygen.

## 2016-11-04 NOTE — Progress Notes (Signed)
3 MONTH FOLLOW UP  Type 2 diabetes mellitus without complication, without long-term current use of insulin (HCC) Discussed general issues about diabetes pathophysiology and management., Educational material distributed., Suggested low cholesterol diet., Encouraged aerobic exercise., Discussed foot care., Reminded to get yearly retinal exam. Will increase farxiga to 10mg  and continue glipizide  Mixed hyperlipidemia -continue medications, check lipids, decrease fatty foods, increase activity.  - CRESTOR 20MG  1/2 3 DAYS A WEEK Recheck chol   Tobacco abuse -Smoking cessation-  counseled patient on the dangers of tobacco use, advised patient to stop smoking, and reviewed strategies to maximize success, continue to try   Continue diet and meds as discussed. Further disposition pending results of labs. Discussed med's effects and SE's.    HPI 61 y.o. female  Presents for 3 month fu for uncontrolled DM and chol.  Got PAP and MGM today, and working there in the afternoon   Her blood pressure has been controlled at home, today her BP is BP: 116/78.  She does not workout. She denies chest pain, shortness of breath, dizziness.  She is not on cholesterol medication and denies myalgias. Her cholesterol is not at goal. The cholesterol was:   Lab Results  Component Value Date   CHOL 283 (H) 10/25/2015   HDL 36 (L) 10/25/2015   LDLCALC 175 (H) 10/25/2015   TRIG 361 (H) 10/25/2015   CHOLHDL 7.9 (H) 10/25/2015    She has uncontrolled DM without complications, she is on bASA, she is not on ACE/ARB due to hypotension, on farxiga, does check sugars at home, has been 200's in the morning, she was suppose to start glipzide but never picked it up and denies  paresthesia of the feet, polydipsia, polyuria and visual disturbances. Last A1C was:  Lab Results  Component Value Date   HGBA1C 8.9 (H) 07/31/2016   Patient is on Vitamin D supplement. Lab Results  Component Value Date   VD25OH 9 (L) 10/25/2015    She is a smoker wants to quit for new grandbaby, however when her mom passed she started back smoking.   She is on celexa for anxiety.  She has allergies, is on claritin and singulair.  BMI is Body mass index is 29.79 kg/m., she is working on diet and exercise. Wt Readings from Last 3 Encounters:  11/04/16 179 lb (81.2 kg)  09/23/16 182 lb (82.6 kg)  07/31/16 178 lb (80.7 kg)    Current Medications:  Current Outpatient Prescriptions on File Prior to Visit  Medication Sig Dispense Refill  . aspirin EC 81 MG tablet Take 81 mg by mouth daily.    . citalopram (CELEXA) 20 MG tablet TAKE 1 TABLET EACH DAY. 30 tablet 0  . FARXIGA 5 MG TABS tablet TAKE 1 TABLET DAILY. 30 tablet 0  . fluticasone (FLONASE) 50 MCG/ACT nasal spray Place 2 sprays into both nostrils daily. 16 g 2  . glipiZIDE (GLUCOTROL) 5 MG tablet TAKE 1/2-1 TABLET TWICE DAILY WITH LARGEST MEAL. 60 tablet 0  . meclizine (ANTIVERT) 25 MG tablet 1/2-1 pill up to 3 times daily for motion sickness/dizziness 30 tablet 0  . meloxicam (MOBIC) 15 MG tablet Take one daily with food for 2 weeks, can take with tylenol, can not take with aleve, iburpofen, then as needed daily for pain 30 tablet 1  . montelukast (SINGULAIR) 10 MG tablet Take 1 tablet (10 mg total) by mouth at bedtime. 90 tablet 0  . ondansetron (ZOFRAN ODT) 8 MG disintegrating tablet 8mg  ODT q4 hours prn nausea  20 tablet 0  . ONE TOUCH ULTRA TEST test strip USE TO TEST BLOOD SUGAR 3 TIMES DAILY OR AS DIRECTED. 25 each 0  . rosuvastatin (CRESTOR) 20 MG tablet Take 1 tablet (20 mg total) by mouth at bedtime. 30 tablet 11   No current facility-administered medications on file prior to visit.    Medical History:  Past Medical History:  Diagnosis Date  . Anxiety   . Diabetes mellitus without complication (HCC)   . Hx of colonic polyps    Fhx Colon cancer  . Hyperlipidemia   . Hypocholesteremia   . Tobacco abuse    Allergies:  Allergies  Allergen Reactions  . Levaquin  [Levofloxacin] Hives and Other (See Comments)    Tingling sensation to mouth, near LOC, blurred vision,    . Eggs Or Egg-Derived Products     Unknown reaction   . Iodine     Unknown reaction   . Metformin And Related Diarrhea  . Shellfish Allergy     Unknown reaction   . Vitamin D Analogs Diarrhea    High dose Vitamin D caused diarrhea  . Amoxicillin Rash  . Kombiglyze [Saxagliptin-Metformin Er] Other (See Comments)    Bloating  . Latex Itching and Rash  . Penicillins Rash    Review of Systems:  Review of Systems  Constitutional: Negative.   HENT: Negative for congestion.   Eyes: Negative.   Respiratory: Negative for cough, hemoptysis, sputum production, shortness of breath and wheezing.   Cardiovascular: Negative.   Gastrointestinal: Negative for diarrhea.  Genitourinary: Negative.   Musculoskeletal: Negative.   Skin: Negative.   Neurological: Negative.   Endo/Heme/Allergies: Negative.   Psychiatric/Behavioral: Negative.     Physical Exam: BP 116/78   Pulse 63   Temp 98.2 F (36.8 C)   Resp 16   Ht 5\' 5"  (1.651 m)   Wt 179 lb (81.2 kg)   SpO2 97%   BMI 29.79 kg/m  Wt Readings from Last 3 Encounters:  11/04/16 179 lb (81.2 kg)  09/23/16 182 lb (82.6 kg)  07/31/16 178 lb (80.7 kg)   General Appearance: Well nourished, in no apparent distress. Eyes: PERRLA, EOMs, conjunctiva no swelling or erythema Sinuses: No Frontal/maxillary tenderness ENT/Mouth: Ext aud canals clear, TMs without erythema, bulging. No erythema, swelling, or exudate on post pharynx.  Tonsils not swollen or erythematous. Hearing normal.  Neck: Supple, thyroid normal.  Respiratory: Respiratory effort normal, BS equal bilaterally without rales, rhonchi, wheezing or stridor.  Cardio: RRR with no MRGs. Brisk peripheral pulses without edema.  Abdomen: Soft, + BS.  Non tender, no guarding, rebound, hernias, masses. Lymphatics: Non tender without lymphadenopathy.  Musculoskeletal: Full ROM, 5/5  strength, Normal gait Skin: Warm, dry without rashes, lesions, ecchymosis.  Neuro: Cranial nerves intact. No cerebellar symptoms.  Psych: Awake and oriented X 3, normal affect, Insight and Judgment appropriate.   Quentin MullingAmanda Kymari Nuon, PA-C 4:13 PM [facility]

## 2016-11-05 ENCOUNTER — Ambulatory Visit: Payer: Self-pay | Admitting: Physician Assistant

## 2016-11-05 LAB — LIPID PANEL
CHOLESTEROL: 266 mg/dL — AB (ref <200)
HDL: 31 mg/dL — ABNORMAL LOW (ref >50)
TRIGLYCERIDES: 534 mg/dL — AB (ref <150)
Total CHOL/HDL Ratio: 8.6 Ratio — ABNORMAL HIGH (ref <5.0)

## 2016-11-05 LAB — HEPATIC FUNCTION PANEL
ALK PHOS: 88 U/L (ref 33–130)
ALT: 13 U/L (ref 6–29)
AST: 13 U/L (ref 10–35)
Albumin: 4.2 g/dL (ref 3.6–5.1)
BILIRUBIN DIRECT: 0.1 mg/dL (ref <=0.2)
BILIRUBIN INDIRECT: 0.3 mg/dL (ref 0.2–1.2)
BILIRUBIN TOTAL: 0.4 mg/dL (ref 0.2–1.2)
Total Protein: 6.9 g/dL (ref 6.1–8.1)

## 2016-11-05 LAB — BASIC METABOLIC PANEL WITH GFR
BUN: 16 mg/dL (ref 7–25)
CALCIUM: 9.5 mg/dL (ref 8.6–10.4)
CO2: 25 mmol/L (ref 20–31)
CREATININE: 1.07 mg/dL — AB (ref 0.50–1.05)
Chloride: 103 mmol/L (ref 98–110)
GFR, Est African American: 66 mL/min (ref >=60)
GFR, Est Non African American: 57 mL/min — ABNORMAL LOW (ref >=60)
Glucose, Bld: 269 mg/dL — ABNORMAL HIGH (ref 65–99)
Potassium: 4.6 mmol/L (ref 3.5–5.3)
SODIUM: 138 mmol/L (ref 135–146)

## 2016-11-05 LAB — HEMOGLOBIN A1C
Hgb A1c MFr Bld: 9 % — ABNORMAL HIGH (ref <5.7)
Mean Plasma Glucose: 212 mg/dL

## 2016-11-07 DIAGNOSIS — R7989 Other specified abnormal findings of blood chemistry: Secondary | ICD-10-CM | POA: Insufficient documentation

## 2016-11-07 NOTE — Progress Notes (Signed)
LVM for pt to return office call for LAB results.

## 2016-11-11 NOTE — Progress Notes (Signed)
LVM for pt to return office call for LAB results.

## 2016-12-01 ENCOUNTER — Encounter: Payer: Self-pay | Admitting: Internal Medicine

## 2016-12-01 ENCOUNTER — Ambulatory Visit (INDEPENDENT_AMBULATORY_CARE_PROVIDER_SITE_OTHER): Payer: 59 | Admitting: Internal Medicine

## 2016-12-01 VITALS — BP 128/76 | HR 92 | Temp 98.0°F | Resp 16 | Ht 65.0 in

## 2016-12-01 DIAGNOSIS — J209 Acute bronchitis, unspecified: Secondary | ICD-10-CM | POA: Diagnosis not present

## 2016-12-01 MED ORDER — PROMETHAZINE-DM 6.25-15 MG/5ML PO SYRP: ORAL_SOLUTION | ORAL | 1 refills | Status: DC

## 2016-12-01 MED ORDER — AZITHROMYCIN 250 MG PO TABS: ORAL_TABLET | ORAL | 0 refills | Status: DC

## 2016-12-01 MED ORDER — PREDNISONE 20 MG PO TABS: ORAL_TABLET | ORAL | 0 refills | Status: DC

## 2016-12-01 MED ORDER — AZELASTINE HCL 0.1 % NA SOLN: 2.0000 | Freq: Two times a day (BID) | NASAL | 2 refills | Status: DC

## 2016-12-01 NOTE — Progress Notes (Signed)
HPI  Patient presents to the office for evaluation of cough.  It has been going on for 4 days.  Patient reports wet, barky, cough with thick mucous.  They also endorse change in voice, postnasal drip and low grade fevers 99-101, yellow nasal congestion, sinus congestion, right ear pain.  .  They have tried mucinex, tylenol and ibuprofen.  They report that nothing has worked.  They admits to other sick contacts.  Her daughter had very similar symptoms.  She has not seen many cases of flu.     Review of Systems  Constitutional: Positive for malaise/fatigue. Negative for chills and fever.  HENT: Positive for congestion, ear pain, hearing loss and sore throat.   Respiratory: Positive for cough. Negative for sputum production, shortness of breath and wheezing.   Cardiovascular: Negative for chest pain, palpitations and leg swelling.  Neurological: Positive for headaches.    PE:  Vitals:   12/01/16 1613  BP: 128/76  Pulse: 92  Resp: 16  Temp: 98 F (36.7 C)    General:  Alert and non-toxic, WDWN, NAD HEENT: NCAT, PERLA, EOM normal, no occular discharge or erythema.  Nasal mucosal edema with sinus tenderness to palpation.  Oropharynx clear with minimal oropharyngeal edema and erythema.  Mucous membranes moist and pink. Neck:  Cervical adenopathy Chest:  RRR no MRGs.  Lungs clear to auscultation A&P with no wheezes rhonchi or rales.   Abdomen: +BS x 4 quadrants, soft, non-tender, no guarding, rigidity, or rebound. Skin: warm and dry no rash Neuro: A&Ox4, CN II-XII grossly intact  Assessment and Plan:   1. Acute bronchitis, unspecified organism -cont daily anthistamine -benadryl prn -flonase - azithromycin (ZITHROMAX Z-PAK) 250 MG tablet; 2 po day one, then 1 daily x 4 days  Dispense: 6 tablet; Refill: 0 - predniSONE (DELTASONE) 20 MG tablet; 3 tabs po day one, then 2 tabs daily x 4 days  Dispense: 11 tablet; Refill: 0 - promethazine-dextromethorphan (PROMETHAZINE-DM) 6.25-15 MG/5ML  syrup; Take 5-10 ML PO q8hrs prn for cough.  Dispense: 180 mL; Refill: 1 - azelastine (ASTELIN) 0.1 % nasal spray; Place 2 sprays into both nostrils 2 (two) times daily. Use in each nostril as directed  Dispense: 30 mL; Refill: 2

## 2016-12-05 ENCOUNTER — Other Ambulatory Visit: Payer: Self-pay | Admitting: Internal Medicine

## 2016-12-05 DIAGNOSIS — J209 Acute bronchitis, unspecified: Secondary | ICD-10-CM

## 2016-12-05 MED ORDER — AZITHROMYCIN 250 MG PO TABS: ORAL_TABLET | ORAL | 0 refills | Status: DC

## 2016-12-05 NOTE — Progress Notes (Signed)
Patient called reporting that she has a small pin hole in her ear and is requesting further antibiotics.  Will send in zpak.  Can't do ear drops as patient has allergy to cipro and levaquin.

## 2016-12-19 ENCOUNTER — Other Ambulatory Visit: Payer: Self-pay | Admitting: Internal Medicine

## 2016-12-19 DIAGNOSIS — R0989 Other specified symptoms and signs involving the circulatory and respiratory systems: Secondary | ICD-10-CM

## 2016-12-20 ENCOUNTER — Ambulatory Visit (HOSPITAL_COMMUNITY): Admission: RE | Admit: 2016-12-20 | Payer: 59 | Source: Ambulatory Visit

## 2017-01-20 ENCOUNTER — Other Ambulatory Visit: Payer: Self-pay | Admitting: Internal Medicine

## 2017-01-20 DIAGNOSIS — E1165 Type 2 diabetes mellitus with hyperglycemia: Principal | ICD-10-CM

## 2017-01-20 DIAGNOSIS — IMO0001 Reserved for inherently not codable concepts without codable children: Secondary | ICD-10-CM

## 2017-02-06 DIAGNOSIS — H2513 Age-related nuclear cataract, bilateral: Secondary | ICD-10-CM | POA: Diagnosis not present

## 2017-02-06 DIAGNOSIS — H10413 Chronic giant papillary conjunctivitis, bilateral: Secondary | ICD-10-CM | POA: Diagnosis not present

## 2017-02-06 DIAGNOSIS — E119 Type 2 diabetes mellitus without complications: Secondary | ICD-10-CM | POA: Diagnosis not present

## 2017-02-06 LAB — HM DIABETES EYE EXAM

## 2017-02-09 NOTE — Progress Notes (Deleted)
3 MONTH FOLLOW UP  Type 2 diabetes mellitus without complication, without long-term current use of insulin (HCC) Discussed general issues about diabetes pathophysiology and management., Educational material distributed., Suggested low cholesterol diet., Encouraged aerobic exercise., Discussed foot care., Reminded to get yearly retinal exam. Will increase farxiga to  and continue glipizide  Mixed hyperlipidemia -continue medications, check lipids, decrease fatty foods, increase activity.  - CRESTOR  1/2 3 DAYS A WEEK Recheck chol   Tobacco abuse -Smoking cessation-  counseled patient on the dangers of tobacco use, advised patient to stop smoking, and reviewed strategies to maximize success, continue to try   Continue diet and meds as discussed. Further disposition pending results of labs. Discussed med's effects and SE's.    HPI 61 y.o. female  Presents for 3 month fu for uncontrolled DM and chol.  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  She does not workout. She denies chest pain, shortness of breath, dizziness.  She is not on cholesterol medication and denies myalgias. Her cholesterol is not at goal. The cholesterol was:   Lab Results  Component Value Date   CHOL 266 (H) 11/04/2016   HDL 31 (L) 11/04/2016   LDLCALC NOT CALC 11/04/2016   TRIG 534 (H) 11/04/2016   CHOLHDL 8.6 (H) 11/04/2016    She has uncontrolled DM without complications, she is on bASA, she is not on ACE/ARB due to hypotension, on farxiga, does check sugars at home, has been 200's in the morning, she was suppose to start glipzide but never picked it up and denies  paresthesia of the feet, polydipsia, polyuria and visual disturbances. Last A1C was:  Lab Results  Component Value Date   HGBA1C 9.0 (H) 11/04/2016   Patient is on Vitamin D supplement. Lab Results  Component Value Date   VD25OH 9 (L) 10/25/2015   She is a  smoker wants to quit for new grandbaby, however when her mom passed she started back smoking.   She is on celexa for anxiety.  She has allergies, is on claritin and singulair.  BMI is There is no height or weight on file to calculate BMI., she is working on diet and exercise. Wt Readings from Last 3 Encounters:  11/04/16 179 lb (81.2 kg)  09/23/16 182 lb (82.6 kg)  07/31/16 178 lb (80.7 kg)    Current Medications:  Current Outpatient Prescriptions on File Prior to Visit  Medication Sig Dispense Refill  . aspirin EC 81 MG tablet Take 81 mg by mouth daily.    Marland Kitchen azelastine (ASTELIN) 0.1 % nasal spray Place 2 sprays into both nostrils 2 (two) times daily. Use in each nostril as directed 30 mL 2  . azithromycin (ZITHROMAX Z-PAK) 250 MG tablet 2 po day one, then 1 daily x 4 days 6 tablet 0  . citalopram (CELEXA) 20 MG tablet TAKE 1 TABLET EACH DAY. 30 tablet 3  . dapagliflozin propanediol (FARXIGA) 10 MG TABS tablet Take 10 mg by mouth daily. 30 tablet 5  . fluticasone (FLONASE) 50 MCG/ACT nasal spray Place 2 sprays into both nostrils daily. 16 g 2  . glipiZIDE (GLUCOTROL) 5 MG tablet TAKE 1/2 TO 1 TABLET TWICE DAILY WITH THE LARGEST MEAL. 60 tablet 0  . meclizine (ANTIVERT) 25 MG tablet 1/2-1 pill up to 3 times daily for motion sickness/dizziness 30 tablet 0  . meloxicam (MOBIC) 15 MG tablet Take one  daily with food for 2 weeks, can take with tylenol, can not take with aleve, iburpofen, then as needed daily for pain 30 tablet 1  . montelukast (SINGULAIR) 10 MG tablet Take 1 tablet (10 mg total) by mouth at bedtime. 90 tablet 0  . ondansetron (ZOFRAN ODT) 8 MG disintegrating tablet  ODT q4 hours prn nausea 20 tablet 0  . ONE TOUCH ULTRA TEST test strip USE TO TEST BLOOD SUGAR 3 TIMES DAILY OR AS DIRECTED. 25 each 0  . predniSONE (DELTASONE) 20 MG tablet 3 tabs po day one, then 2 tabs daily x 4 days 11 tablet 0  . promethazine-dextromethorphan (PROMETHAZINE-DM) 6.25-15 MG/5ML syrup Take 5-10 ML  PO q8hrs prn for cough. 180 mL 1  . rosuvastatin (CRESTOR) 20 MG tablet Take 1 tablet (20 mg total) by mouth at bedtime. 30 tablet 11   No current facility-administered medications on file prior to visit.    Medical History:  Past Medical History:  Diagnosis Date  . Anxiety   . Diabetes mellitus without complication (HCC)   . Hx of colonic polyps    Fhx Colon cancer  . Hyperlipidemia   . Hypocholesteremia   . Tobacco abuse    Allergies:  Allergies  Allergen Reactions  . Levaquin [Levofloxacin] Hives and Other (See Comments)    Tingling sensation to mouth, near LOC, blurred vision,    . Eggs Or Egg-Derived Products     Unknown reaction   . Iodine     Unknown reaction   . Metformin And Related Diarrhea  . Shellfish Allergy     Unknown reaction   . Vitamin D Analogs Diarrhea    High dose Vitamin D caused diarrhea  . Amoxicillin Rash  . Kombiglyze [Saxagliptin-Metformin Er] Other (See Comments)    Bloating  . Latex Itching and Rash  . Penicillins Rash    Review of Systems:  Review of Systems  Constitutional: Negative.   HENT: Negative for congestion.   Eyes: Negative.   Respiratory: Negative for cough, hemoptysis, sputum production, shortness of breath and wheezing.   Cardiovascular: Negative.   Gastrointestinal: Negative for diarrhea.  Genitourinary: Negative.   Musculoskeletal: Negative.   Skin: Negative.   Neurological: Negative.   Endo/Heme/Allergies: Negative.   Psychiatric/Behavioral: Negative.     Physical Exam: There were no vitals taken for this visit. Wt Readings from Last 3 Encounters:  11/04/16 179 lb (81.2 kg)  09/23/16 182 lb (82.6 kg)  07/31/16 178 lb (80.7 kg)   General Appearance: Well nourished, in no apparent distress. Eyes: PERRLA, EOMs, conjunctiva no swelling or erythema Sinuses: No Frontal/maxillary tenderness ENT/Mouth: Ext aud canals clear, TMs without erythema, bulging. No erythema, swelling, or exudate on post pharynx.  Tonsils  not swollen or erythematous. Hearing normal.  Neck: Supple, thyroid normal.  Respiratory: Respiratory effort normal, BS equal bilaterally without rales, rhonchi, wheezing or stridor.  Cardio: RRR with no MRGs. Brisk peripheral pulses without edema.  Abdomen: Soft, + BS.  Non tender, no guarding, rebound, hernias, masses. Lymphatics: Non tender without lymphadenopathy.  Musculoskeletal: Full ROM, 5/5 strength, Normal gait Skin: Warm, dry without rashes, lesions, ecchymosis.  Neuro: Cranial nerves intact. No cerebellar symptoms.  Psych: Awake and oriented X 3, normal affect, Insight and Judgment appropriate.   Quentin Mulling, PA-C 1:31 PM [facility]

## 2017-02-11 ENCOUNTER — Ambulatory Visit: Payer: Self-pay | Admitting: Physician Assistant

## 2017-03-02 ENCOUNTER — Ambulatory Visit (INDEPENDENT_AMBULATORY_CARE_PROVIDER_SITE_OTHER): Payer: 59 | Admitting: Physician Assistant

## 2017-03-02 ENCOUNTER — Encounter: Payer: Self-pay | Admitting: Physician Assistant

## 2017-03-02 VITALS — BP 124/76 | HR 85 | Temp 97.3°F | Resp 14 | Ht 65.0 in | Wt 176.0 lb

## 2017-03-02 DIAGNOSIS — R110 Nausea: Secondary | ICD-10-CM | POA: Diagnosis not present

## 2017-03-02 DIAGNOSIS — R509 Fever, unspecified: Secondary | ICD-10-CM | POA: Diagnosis not present

## 2017-03-02 LAB — CBC WITH DIFFERENTIAL/PLATELET
BASOS PCT: 1 %
Basophils Absolute: 56 cells/uL (ref 0–200)
EOS ABS: 168 {cells}/uL (ref 15–500)
Eosinophils Relative: 3 %
HCT: 44.8 % (ref 35.0–45.0)
Hemoglobin: 15.1 g/dL (ref 11.7–15.5)
LYMPHS PCT: 40 %
Lymphs Abs: 2240 cells/uL (ref 850–3900)
MCH: 30.9 pg (ref 27.0–33.0)
MCHC: 33.7 g/dL (ref 32.0–36.0)
MCV: 91.6 fL (ref 80.0–100.0)
MPV: 9.9 fL (ref 7.5–12.5)
Monocytes Absolute: 448 cells/uL (ref 200–950)
Monocytes Relative: 8 %
Neutro Abs: 2688 cells/uL (ref 1500–7800)
Neutrophils Relative %: 48 %
PLATELETS: 193 10*3/uL (ref 140–400)
RBC: 4.89 MIL/uL (ref 3.80–5.10)
RDW: 13.4 % (ref 11.0–15.0)
WBC: 5.6 10*3/uL (ref 3.8–10.8)

## 2017-03-02 MED ORDER — METRONIDAZOLE 500 MG PO TABS: 500.0000 mg | ORAL_TABLET | Freq: Three times a day (TID) | ORAL | 0 refills | Status: AC

## 2017-03-02 MED ORDER — DOXYCYCLINE HYCLATE 100 MG PO CAPS: ORAL_CAPSULE | ORAL | 0 refills | Status: DC

## 2017-03-02 NOTE — Patient Instructions (Signed)
Fever, Adult A fever is an increase in the body's temperature. It is usually defined as a temperature of 100F (38C) or higher. Brief mild or moderate fevers generally have no long-term effects, and they often do not require treatment. Moderate or high fevers may make you feel uncomfortable and can sometimes be a sign of a serious illness or disease. The sweating that may occur with repeated or prolonged fever may also cause dehydration. Fever is confirmed by taking a temperature with a thermometer. A measured temperature can vary with:  Age.  Time of day.  Location of the thermometer:  Mouth (oral).  Rectum (rectal).  Ear (tympanic).  Underarm (axillary).  Forehead (temporal). Follow these instructions at home: Pay attention to any changes in your symptoms. Take these actions to help with your condition:  Take over-the counter and prescription medicines only as told by your health care provider. Follow the dosing instructions carefully.  If you were prescribed an antibiotic medicine, take it as told by your health care provider. Do not stop taking the antibiotic even if you start to feel better.  Rest as needed.  Drink enough fluid to keep your urine clear or pale yellow. This helps to prevent dehydration.  Sponge yourself or bathe with room-temperature water to help reduce your body temperature as needed. Do not use ice water.  Do not overbundle yourself in blankets or heavy clothes. Contact a health care provider if:  You vomit.  You cannot eat or drink without vomiting.  You have diarrhea.  You have pain when you urinate.  Your symptoms do not improve with treatment.  You develop new symptoms.  You develop excessive weakness. Get help right away if:  You have shortness of breath or have trouble breathing.  You are dizzy or you faint.  You are disoriented or confused.  You develop signs of dehydration, such as a dry mouth, decreased urination, or  paleness.  You develop severe pain in your abdomen.  You have persistent vomiting or diarrhea.  You develop a skin rash.  Your symptoms suddenly get worse. This information is not intended to replace advice given to you by your health care provider. Make sure you discuss any questions you have with your health care provider. Document Released: 03/25/2001 Document Revised: 03/06/2016 Document Reviewed: 11/23/2014 Elsevier Interactive Patient Education  2017 [employer]  Dizziness Dizziness is a common problem. It is a feeling of unsteadiness or light-headedness. You may feel like you are about to faint. Dizziness can lead to injury if you stumble or fall. Anyone can become dizzy, but dizziness is more common in older adults. This condition can be caused by a number of things, including medicines, dehydration, or illness. Follow these instructions at home: Taking these steps may help with your condition: Eating and drinking   Drink enough fluid to keep your urine clear or pale yellow. This helps to keep you from becoming dehydrated. Try to drink more clear fluids, such as water.  Do not drink alcohol.  Limit your caffeine intake if directed by your health care provider.  Limit your salt intake if directed by your health care provider. Activity   Avoid making quick movements.  Rise slowly from chairs and steady yourself until you feel okay.  In the morning, first sit up on the side of the bed. When you feel okay, stand slowly while you hold onto something until you know that your balance is fine.  Move your legs often if you need to stand  in one place for a long time. Tighten and relax your muscles in your legs while you are standing.  Do not drive or operate heavy machinery if you feel dizzy.  Avoid bending down if you feel dizzy. Place items in your home so that they are easy for you to reach without leaning over. Lifestyle   Do not use any tobacco products, including  cigarettes, chewing tobacco, or electronic cigarettes. If you need help quitting, ask your health care provider.  Try to reduce your stress level, such as with yoga or meditation. Talk with your health care provider if you need help. General instructions   Watch your dizziness for any changes.  Take medicines only as directed by your health care provider. Talk with your health care provider if you think that your dizziness is caused by a medicine that you are taking.  Tell a friend or a family member that you are feeling dizzy. If he or she notices any changes in your behavior, have this person call your health care provider.  Keep all follow-up visits as directed by your health care provider. This is important. Contact a health care provider if:  Your dizziness does not go away.  Your dizziness or light-headedness gets worse.  You feel nauseous.  You have reduced hearing.  You have new symptoms.  You are unsteady on your feet or you feel like the room is spinning. Get help right away if:  You vomit or have diarrhea and are unable to eat or drink anything.  You have problems talking, walking, swallowing, or using your arms, hands, or legs.  You feel generally weak.  You are not thinking clearly or you have trouble forming sentences. It may take a friend or family member to notice this.  You have chest pain, abdominal pain, shortness of breath, or sweating.  Your vision changes.  You notice any bleeding.  You have a headache.  You have neck pain or a stiff neck.  You have a fever. This information is not intended to replace advice given to you by your health care provider. Make sure you discuss any questions you have with your health care provider. Document Released: 03/25/2001 Document Revised: 03/06/2016 Document Reviewed: 09/25/2014 Elsevier Interactive Patient Education  2017 [employer]

## 2017-03-02 NOTE — Progress Notes (Signed)
Subjective:    Patient ID: Candice [name], female    DOB: 02-22-1956, 61 y.o.   MRN: [id number]  HPI 61 y.o. WF presents with fever 101 at home x Thursday. [facility], had nausea, decreased appetite, fatigue, loose stools. No cough, no CP. No SOB, no urinary issues.   Blood pressure 124/76, pulse 85, temperature 97.3 F (36.3 C), resp. rate 14, height 5\' 5"  (1.651 m), weight 176 lb (79.8 kg), SpO2 95 %.  Medications Current Outpatient Prescriptions on File Prior to Visit  Medication Sig  . aspirin EC 81 MG tablet Take 81 mg by mouth daily.  Marland Kitchen. azelastine (ASTELIN) 0.1 % nasal spray Place 2 sprays into both nostrils 2 (two) times daily. Use in each nostril as directed  . citalopram (CELEXA) 20 MG tablet TAKE 1 TABLET EACH DAY.  . dapagliflozin propanediol (FARXIGA) 10 MG TABS tablet Take 10 mg by mouth daily.  . fluticasone (FLONASE) 50 MCG/ACT nasal spray Place 2 sprays into both nostrils daily.  Marland Kitchen. glipiZIDE (GLUCOTROL) 5 MG tablet TAKE 1/2 TO 1 TABLET TWICE DAILY WITH THE LARGEST MEAL.  Marland Kitchen. meclizine (ANTIVERT) 25 MG tablet 1/2-1 pill up to 3 times daily for motion sickness/dizziness  . meloxicam (MOBIC) 15 MG tablet Take one daily with food for 2 weeks, can take with tylenol, can not take with aleve, iburpofen, then as needed daily for pain  . montelukast (SINGULAIR) 10 MG tablet Take 1 tablet (10 mg total) by mouth at bedtime.  . ondansetron (ZOFRAN ODT) 8 MG disintegrating tablet 8mg  ODT q4 hours prn nausea  . ONE TOUCH ULTRA TEST test strip USE TO TEST BLOOD SUGAR 3 TIMES DAILY OR AS DIRECTED.  Marland Kitchen. rosuvastatin (CRESTOR) 20 MG tablet Take 1 tablet (20 mg total) by mouth at bedtime.   No current facility-administered medications on file prior to visit.     Problem list She has Tobacco abuse; Back pain; Diabetes (HCC); Mixed hyperlipidemia; Generalized anxiety disorder; Vitamin D deficiency; Medication management; and Encounter for general adult medical examination  with abnormal findings on her problem list.   Review of Systems  Constitutional: Positive for appetite change, chills, fatigue and fever. Negative for activity change, diaphoresis and unexpected weight change.  HENT: Negative.   Respiratory: Negative.   Cardiovascular: Negative.   Gastrointestinal: Positive for abdominal pain, diarrhea and nausea. Negative for abdominal distention, anal bleeding, blood in stool, constipation, rectal pain and vomiting.  Genitourinary: Negative.   Musculoskeletal: Negative.   Skin: Negative.  Negative for rash.  Neurological: Negative.        Objective:   Physical Exam  Constitutional: She is oriented to person, place, and time. She appears well-developed and well-nourished.  HENT:  Head: Normocephalic and atraumatic.  Right Ear: External ear normal.  Left Ear: External ear normal.  Mouth/Throat: Oropharynx is clear and moist.  Eyes: Conjunctivae and EOM are normal. Pupils are equal, round, and reactive to light.  Neck: Normal range of motion. Neck supple. No thyromegaly present.  Cardiovascular: Normal rate, regular rhythm and normal heart sounds.  Exam reveals no gallop and no friction rub.   No murmur heard. Pulmonary/Chest: Effort normal and breath sounds normal. No respiratory distress. She has no wheezes.  Abdominal: Soft. Bowel sounds are normal. She exhibits no distension and no mass. There is no tenderness. There is no rebound and no guarding.  Benign AB, no RUQ pain, no AB pain  Musculoskeletal: Normal range of motion.  Lymphadenopathy:  She has no cervical adenopathy.  Neurological: She is alert and oriented to person, place, and time. She displays normal reflexes. No cranial nerve deficit. Coordination normal.  Skin: Skin is warm and dry.  Psychiatric: She has a normal mood and affect.          Assessment & Plan:    Fever, unspecified fever cause ? Viral versus diverticulitis Check labs Call if any changes in  symptoms States can not take bactrim, levaquin, or penicillin -     doxycycline (VIBRAMYCIN) 100 MG capsule; Take 1 capsule twice daily with food -     metroNIDAZOLE (FLAGYL) 500 MG tablet; Take 1 tablet (500 mg total) by mouth 3 (three) times daily. -     CBC with Differential/Platelet -     BASIC METABOLIC PANEL WITH GFR -     Hepatic function panel -     TSH  Nausea -     doxycycline (VIBRAMYCIN) 100 MG capsule; Take 1 capsule twice daily with food -     metroNIDAZOLE (FLAGYL) 500 MG tablet; Take 1 tablet (500 mg total) by mouth 3 (three) times daily.

## 2017-03-03 LAB — HEPATIC FUNCTION PANEL
ALBUMIN: 4.3 g/dL (ref 3.6–5.1)
ALK PHOS: 102 U/L (ref 33–130)
ALT: 17 U/L (ref 6–29)
AST: 14 U/L (ref 10–35)
BILIRUBIN TOTAL: 0.3 mg/dL (ref 0.2–1.2)
Bilirubin, Direct: 0.1 mg/dL (ref <=0.2)
Indirect Bilirubin: 0.2 mg/dL (ref 0.2–1.2)
Total Protein: 6.7 g/dL (ref 6.1–8.1)

## 2017-03-03 LAB — TSH: TSH: 1.22 mIU/L

## 2017-03-03 LAB — BASIC METABOLIC PANEL WITH GFR
BUN: 13 mg/dL (ref 7–25)
CALCIUM: 9.3 mg/dL (ref 8.6–10.4)
CO2: 25 mmol/L (ref 20–31)
CREATININE: 0.68 mg/dL (ref 0.50–1.05)
Chloride: 104 mmol/L (ref 98–110)
GFR, Est Non African American: >89 mL/min (ref >=60)
Glucose, Bld: 144 mg/dL — ABNORMAL HIGH (ref 65–99)
POTASSIUM: 4.4 mmol/L (ref 3.5–5.3)
Sodium: 139 mmol/L (ref 135–146)

## 2017-03-04 NOTE — Progress Notes (Signed)
LVM for pt to return office call for LAB results & LAB RESULTS w/ an letter to explain to results. Pt works office hours.

## 2017-03-16 ENCOUNTER — Other Ambulatory Visit: Payer: Self-pay | Admitting: Physician Assistant

## 2017-04-07 NOTE — Progress Notes (Signed)
3 MONTH FOLLOW UP  Type 2 diabetes mellitus without complication, without long-term current use of insulin (HCC) Discussed general issues about diabetes pathophysiology and management., Educational material distributed., Suggested low cholesterol diet., Encouraged aerobic exercise., Discussed foot care., Reminded to get yearly retinal exam.  Mixed hyperlipidemia -continue medications, check lipids, decrease fatty foods, increase activity.  Recheck chol   Tobacco abuse -Smoking cessation-  counseled patient on the dangers of tobacco use, advised patient to stop smoking, and reviewed strategies to maximize success, continue to try  Medication management -     CBC with Differential/Platelet -     BASIC METABOLIC PANEL WITH GFR -     Hepatic function panel -     Magnesium  Myalgia -     CK -     citalopram (CELEXA) 20 MG tablet; TAKE 1 TABLET EACH DAY. - normal neuro, + muscular pain, stretches given for piriformis - if not better will get Xray/refer ortho   Continue diet and meds as discussed. Further disposition pending results of labs. Discussed med's effects and SE's.    HPI 61 y.o. female  Presents for 3 month fu for uncontrolled DM and chol.    Her blood pressure has been controlled at home, today her BP is BP: 126/70.  She does not workout. She denies chest pain, shortness of breath, dizziness. Has left thigh pain x 3 weeks, worse when sitting for a long time, worse at night, keeping her up at night, has been taking advil that is not helping. No pain past her knee, no numbness, tingling. No lower back pain. No warmth, swelling.  She is a smoker wants to quit for new grandbaby, has deceased but has not quit.   She is on cholesterol medication, crestor 20 mg 3 x a week and denies myalgias. Her cholesterol is not at goal. The cholesterol was:   Lab Results  Component Value Date   CHOL 266 (H) 11/04/2016   HDL 31 (L) 11/04/2016   LDLCALC NOT CALC 11/04/2016   TRIG 534 (H)  11/04/2016   CHOLHDL 8.6 (H) 11/04/2016    She has uncontrolled DM without complications, she is on bASA, she is not on ACE/ARB due to hypotension, on farxiga and on glipizide once daily, does check sugars at home, and denies  paresthesia of the feet, polydipsia, polyuria and visual disturbances. Last A1C was:  Lab Results  Component Value Date   HGBA1C 9.0 (H) 11/04/2016   Patient is on Vitamin D supplement. Lab Results  Component Value Date   VD25OH 9 (L) 10/25/2015   She is on celexa for anxiety.  She has allergies, is on claritin and singulair.  BMI is Body mass index is 29.19 kg/m., she is working on diet and exercise. Wt Readings from Last 3 Encounters:  04/08/17 175 lb 6.4 oz (79.6 kg)  03/02/17 176 lb (79.8 kg)  11/04/16 179 lb (81.2 kg)    Current Medications:  Current Outpatient Prescriptions on File Prior to Visit  Medication Sig Dispense Refill  . aspirin EC 81 MG tablet Take 81 mg by mouth daily.    . citalopram (CELEXA) 20 MG tablet TAKE 1 TABLET EACH DAY. 30 tablet 0  . dapagliflozin propanediol (FARXIGA) 10 MG TABS tablet Take 10 mg by mouth daily. 30 tablet 5  . glipiZIDE (GLUCOTROL) 5 MG tablet TAKE 1/2 TO 1 TABLET TWICE DAILY WITH THE LARGEST MEAL. 60 tablet 0  . meclizine (ANTIVERT) 25 MG tablet 1/2-1 pill up to 3 times  daily for motion sickness/dizziness 30 tablet 0  . meloxicam (MOBIC) 15 MG tablet Take one daily with food for 2 weeks, can take with tylenol, can not take with aleve, iburpofen, then as needed daily for pain 30 tablet 1  . montelukast (SINGULAIR) 10 MG tablet Take 1 tablet (10 mg total) by mouth at bedtime. 90 tablet 0  . ONE TOUCH ULTRA TEST test strip USE TO TEST BLOOD SUGAR 3 TIMES DAILY OR AS DIRECTED. 25 each 0  . rosuvastatin (CRESTOR) 20 MG tablet Take 1 tablet (20 mg total) by mouth at bedtime. 30 tablet 11   No current facility-administered medications on file prior to visit.    Medical History:  Past Medical History:  Diagnosis  Date  . Anxiety   . Diabetes mellitus without complication (HCC)   . Hx of colonic polyps    Fhx Colon cancer  . Hyperlipidemia   . Hypocholesteremia   . Tobacco abuse    Allergies:  Allergies  Allergen Reactions  . Levaquin [Levofloxacin] Hives and Other (See Comments)    Tingling sensation to mouth, near LOC, blurred vision,    . Eggs Or Egg-Derived Products     Unknown reaction   . Iodine     Unknown reaction   . Metformin And Related Diarrhea  . Shellfish Allergy     Unknown reaction   . Vitamin D Analogs Diarrhea    High dose Vitamin D caused diarrhea  . Amoxicillin Rash  . Kombiglyze [Saxagliptin-Metformin Er] Other (See Comments)    Bloating  . Latex Itching and Rash  . Penicillins Rash    Review of Systems:  Review of Systems  Constitutional: Negative.   HENT: Negative for congestion.   Eyes: Negative.   Respiratory: Negative for cough, hemoptysis, sputum production, shortness of breath and wheezing.   Cardiovascular: Negative.   Gastrointestinal: Negative for diarrhea.  Genitourinary: Negative.   Musculoskeletal: Positive for joint pain (right femur) and myalgias.  Skin: Negative.   Neurological: Negative.   Endo/Heme/Allergies: Negative.   Psychiatric/Behavioral: Negative.     Physical Exam: BP 126/70   Pulse 81   Temp 97.3 F (36.3 C)   Resp 16   Ht 5\' 5"  (1.651 m)   Wt 175 lb 6.4 oz (79.6 kg)   BMI 29.19 kg/m  Wt Readings from Last 3 Encounters:  04/08/17 175 lb 6.4 oz (79.6 kg)  03/02/17 176 lb (79.8 kg)  11/04/16 179 lb (81.2 kg)   General Appearance: Well nourished, in no apparent distress. Eyes: PERRLA, EOMs, conjunctiva no swelling or erythema Sinuses: No Frontal/maxillary tenderness ENT/Mouth: Ext aud canals clear, TMs without erythema, bulging. No erythema, swelling, or exudate on post pharynx.  Tonsils not swollen or erythematous. Hearing normal.  Neck: Supple, thyroid normal.  Respiratory: Respiratory effort normal, BS equal  bilaterally without rales, rhonchi, wheezing or stridor.  Cardio: RRR with no MRGs. Brisk peripheral pulses without edema.  Abdomen: Soft, + BS.  Non tender, no guarding, rebound, hernias, masses. Lymphatics: Non tender without lymphadenopathy.  Musculoskeletal: Full ROM, 5/5 strength, Normal gait Skin: Warm, dry without rashes, lesions, ecchymosis.  Neuro: Cranial nerves intact. No cerebellar symptoms.  Psych: Awake and oriented X 3, normal affect, Insight and Judgment appropriate.   Quentin Mulling, PA-C 9:47 AM [facility]

## 2017-04-08 ENCOUNTER — Encounter: Payer: Self-pay | Admitting: Physician Assistant

## 2017-04-08 ENCOUNTER — Ambulatory Visit (INDEPENDENT_AMBULATORY_CARE_PROVIDER_SITE_OTHER): Payer: 59 | Admitting: Physician Assistant

## 2017-04-08 VITALS — BP 126/70 | HR 81 | Temp 97.3°F | Resp 16 | Ht 65.0 in | Wt 175.4 lb

## 2017-04-08 DIAGNOSIS — E782 Mixed hyperlipidemia: Secondary | ICD-10-CM | POA: Diagnosis not present

## 2017-04-08 DIAGNOSIS — M791 Myalgia, unspecified site: Secondary | ICD-10-CM

## 2017-04-08 DIAGNOSIS — Z720 Tobacco use: Secondary | ICD-10-CM

## 2017-04-08 DIAGNOSIS — E119 Type 2 diabetes mellitus without complications: Secondary | ICD-10-CM

## 2017-04-08 DIAGNOSIS — Z79899 Other long term (current) drug therapy: Secondary | ICD-10-CM

## 2017-04-08 LAB — CBC WITH DIFFERENTIAL/PLATELET
BASOS ABS: 0 {cells}/uL (ref 0–200)
Basophils Relative: 0 %
EOS PCT: 1 %
Eosinophils Absolute: 69 cells/uL (ref 15–500)
HCT: 46.7 % — ABNORMAL HIGH (ref 35.0–45.0)
HEMOGLOBIN: 15.5 g/dL (ref 11.7–15.5)
LYMPHS PCT: 27 %
Lymphs Abs: 1863 cells/uL (ref 850–3900)
MCH: 30.1 pg (ref 27.0–33.0)
MCHC: 33.2 g/dL (ref 32.0–36.0)
MCV: 90.7 fL (ref 80.0–100.0)
MONOS PCT: 8 %
MPV: 10.2 fL (ref 7.5–12.5)
Monocytes Absolute: 552 cells/uL (ref 200–950)
NEUTROS PCT: 64 %
Neutro Abs: 4416 cells/uL (ref 1500–7800)
Platelets: 228 10*3/uL (ref 140–400)
RBC: 5.15 MIL/uL — ABNORMAL HIGH (ref 3.80–5.10)
RDW: 13.3 % (ref 11.0–15.0)
WBC: 6.9 10*3/uL (ref 3.8–10.8)

## 2017-04-08 LAB — TSH: TSH: 0.98 m[IU]/L

## 2017-04-08 MED ORDER — DAPAGLIFLOZIN PROPANEDIOL 10 MG PO TABS: 10.0000 mg | ORAL_TABLET | Freq: Every day | ORAL | 5 refills | Status: DC

## 2017-04-08 MED ORDER — CITALOPRAM HYDROBROMIDE 20 MG PO TABS: ORAL_TABLET | ORAL | 4 refills | Status: DC

## 2017-04-08 NOTE — Patient Instructions (Addendum)
Please be careful with glipizide (generic). This medication forces your blood sugar down no matter what it is starting at. Only take 1/2 of the medication if your sugar is above 150 and you can take a whole pill if your sugar is above 180 in the morning. If at any time you start to have low blood sugars in the morning or during the day please stop this medication. Please never take this medication if you are sick or can not eat. A low blood sugar is much more dangerous than a high blood sugar. Your brain needs two things, sugar and oxygen.  Check out smoke free app on your phone Let us know if you want to try chantix      Bad carbs also include fruit juice, alcohol, and sweet tea. These are empty calories that do not signal to your brain that you are full.   Please remember the good carbs are still carbs which convert into sugar. So please measure them out no more than 1/2-1 cup of rice, oatmeal, pasta, and beans  Veggies are however free foods! Pile them on.   Not all fruit is created equal. Please see the list below, the fruit at the bottom is higher in sugars than the fruit at the top. Please avoid all dried fruits.       Piriformis Syndrome Rehab Ask your health care provider which exercises are safe for you. Do exercises exactly as told by your health care provider and adjust them as directed. It is normal to feel mild stretching, pulling, tightness, or discomfort as you do these exercises, but you should stop right away if you feel sudden pain or your pain gets worse.Do not begin these exercises until told by your health care provider. Stretching and range of motion exercises These exercises warm up your muscles and joints and improve the movement and flexibility of your hip and pelvis. These exercises also help to relieve pain, numbness, and tingling. Exercise A: Hip rotators  5. Lie on your back on a firm surface. 6. Pull your left / right knee toward your same shoulder with your  left / right hand until your knee is pointing toward the ceiling. Hold your left / right ankle with your other hand. 7. Keeping your knee steady, gently pull your left / right ankle toward your other shoulder until you feel a stretch in your buttocks. 8. Hold this position for __________ seconds. Repeat __________ times. Complete this stretch __________ times a day. Exercise B: Hip extensors 1. Lie on your back on a firm surface. Both of your legs should be straight. 2. Pull your left / right knee to your chest. Hold your leg in this position by holding onto the back of your thigh or the front of your knee. 3. Hold this position for __________ seconds. 4. Slowly return to the starting position. Repeat __________ times. Complete this stretch __________ times a day. Strengthening exercises These exercises build strength and endurance in your hip and thigh muscles. Endurance is the ability to use your muscles for a long time, even after they get tired. Exercise C: Straight leg raises ( hip abductors) 1. Lie on your side with your left / right leg in the top position. Lie so your head, shoulder, knee, and hip line up. Bend your bottom knee to help you balance. 2. Lift your top leg up 4-6 inches (10-15 cm), keeping your toes pointed straight ahead. 3. Hold this position for __________ seconds. 4. Slowly lower  your leg to the starting position. Let your muscles relax completely. Repeat __________ times. Complete this exercise__________ times a day. Exercise D: Hip abductors and rotators, quadruped  1. Get on your hands and knees on a firm, lightly padded surface. Your hands should be directly below your shoulders, and your knees should be directly below your hips. 2. Lift your left / right knee out to the side. Keep your knee bent. Do not twist your body. 3. Hold this position for __________ seconds. 4. Slowly lower your leg. Repeat __________ times. Complete this exercise__________ times a  day. Exercise E: Straight leg raises ( hip extensors) 1. Lie on your abdomen on a bed or a firm surface with a pillow under your hips. 2. Squeeze your buttock muscles and lift your left / right thigh off the bed. Do not let your back arch. 3. Hold this position for __________ seconds. 4. Slowly return to the starting position. Let your muscles relax completely before doing another repetition. Repeat __________ times. Complete this exercise__________ times a day. This information is not intended to replace advice given to you by your health care provider. Make sure you discuss any questions you have with your health care provider. Document Released: 09/29/2005 Document Revised: 06/03/2016 Document Reviewed: 09/11/2015 Elsevier Interactive Patient Education  [employer].

## 2017-04-09 LAB — LIPID PANEL
CHOL/HDL RATIO: 4.4 ratio (ref <5.0)
Cholesterol: 180 mg/dL (ref <200)
HDL: 41 mg/dL — ABNORMAL LOW (ref >50)
LDL CALC: 109 mg/dL — AB (ref <100)
Triglycerides: 149 mg/dL (ref <150)
VLDL: 30 mg/dL (ref <30)

## 2017-04-09 LAB — BASIC METABOLIC PANEL WITH GFR
BUN: 15 mg/dL (ref 7–25)
CALCIUM: 9.4 mg/dL (ref 8.6–10.4)
CO2: 24 mmol/L (ref 20–31)
CREATININE: 0.74 mg/dL (ref 0.50–1.05)
Chloride: 104 mmol/L (ref 98–110)
GFR, Est African American: >89 mL/min (ref >=60)
GFR, Est Non African American: >89 mL/min (ref >=60)
Glucose, Bld: 199 mg/dL — ABNORMAL HIGH (ref 65–99)
Potassium: 4.5 mmol/L (ref 3.5–5.3)
Sodium: 139 mmol/L (ref 135–146)

## 2017-04-09 LAB — HEPATIC FUNCTION PANEL
ALT: 17 U/L (ref 6–29)
AST: 13 U/L (ref 10–35)
Albumin: 4.5 g/dL (ref 3.6–5.1)
Alkaline Phosphatase: 94 U/L (ref 33–130)
BILIRUBIN DIRECT: 0.1 mg/dL (ref <=0.2)
Indirect Bilirubin: 0.5 mg/dL (ref 0.2–1.2)
TOTAL PROTEIN: 7.2 g/dL (ref 6.1–8.1)
Total Bilirubin: 0.6 mg/dL (ref 0.2–1.2)

## 2017-04-09 LAB — MAGNESIUM: MAGNESIUM: 2.2 mg/dL (ref 1.5–2.5)

## 2017-04-09 LAB — HEMOGLOBIN A1C
HEMOGLOBIN A1C: 8.8 % — AB (ref <5.7)
MEAN PLASMA GLUCOSE: 206 mg/dL

## 2017-04-09 LAB — CK: Total CK: 62 U/L (ref 29–143)

## 2017-04-09 NOTE — Progress Notes (Signed)
Pt aware of lab results & voiced understanding of those results.

## 2017-04-09 NOTE — Progress Notes (Signed)
LVM for pt to return office call for LAB results.

## 2017-05-01 ENCOUNTER — Other Ambulatory Visit: Payer: Self-pay | Admitting: Internal Medicine

## 2017-05-01 DIAGNOSIS — E1165 Type 2 diabetes mellitus with hyperglycemia: Principal | ICD-10-CM

## 2017-05-01 DIAGNOSIS — IMO0001 Reserved for inherently not codable concepts without codable children: Secondary | ICD-10-CM

## 2017-05-14 ENCOUNTER — Other Ambulatory Visit: Payer: Self-pay | Admitting: Physician Assistant

## 2017-05-14 DIAGNOSIS — M79652 Pain in left thigh: Secondary | ICD-10-CM

## 2017-05-15 ENCOUNTER — Ambulatory Visit (HOSPITAL_COMMUNITY)
Admission: RE | Admit: 2017-05-15 | Discharge: 2017-05-15 | Disposition: A | Discharge status: Home / Self Care | Payer: 59 | Source: Ambulatory Visit | Attending: Physician Assistant | Admitting: Physician Assistant

## 2017-05-15 DIAGNOSIS — M1612 Unilateral primary osteoarthritis, left hip: Secondary | ICD-10-CM | POA: Diagnosis not present

## 2017-05-15 DIAGNOSIS — M79652 Pain in left thigh: Secondary | ICD-10-CM | POA: Diagnosis not present

## 2017-06-23 ENCOUNTER — Ambulatory Visit (INDEPENDENT_AMBULATORY_CARE_PROVIDER_SITE_OTHER): Payer: 59 | Admitting: Orthopaedic Surgery

## 2017-06-23 ENCOUNTER — Ambulatory Visit (INDEPENDENT_AMBULATORY_CARE_PROVIDER_SITE_OTHER): Payer: 59

## 2017-06-23 DIAGNOSIS — M7062 Trochanteric bursitis, left hip: Secondary | ICD-10-CM | POA: Diagnosis not present

## 2017-06-23 DIAGNOSIS — M5442 Lumbago with sciatica, left side: Secondary | ICD-10-CM | POA: Diagnosis not present

## 2017-06-23 MED ORDER — DICLOFENAC SODIUM 1 % TD GEL: 2.0000 g | Freq: Four times a day (QID) | TRANSDERMAL | 3 refills | Status: DC

## 2017-06-23 NOTE — Progress Notes (Signed)
Office Visit Note   Patient: Candice [name]           Date of Birth: 12-24-1955           MRN: [id number] Visit Date: 06/23/2017              Requested by: Quentin Mulling, PA-C [address] [place], [place] [address] PCP: Lucky Cowboy, MD   Assessment & Plan: Visit Diagnoses:  1. Acute left-sided low back pain with left-sided sciatica   2. Trochanteric bursitis, left hip     Plan: I feel that this is more for trochanteric bursitis. I showed her stretching exercises to try and we'll send in some Voltaren gel for her. Also have her take Aleve twice a day. She demonstrated the stretching exercises back to me and I communicated with her this is central important to get this to go away. I offered a steroid injection and physical therapy was she's deferring those until she sees how the home exercise stretching program and anti-inflammatories do for see her back in 4 weeks to see how she doing overall.  Follow-Up Instructions: Return in about 4 weeks (around 07/21/2017).   Orders:  Orders Placed This Encounter  Procedures  . XR Lumbar Spine 2-3 Views   Meds ordered this encounter  Medications  . diclofenac sodium (VOLTAREN) 1 % GEL    Sig: Apply 2 g topically 4 (four) times daily.    Dispense:  100 g    Refill:  3      Procedures: No procedures performed   Clinical Data: No additional findings.   Subjective: No chief complaint on file. Patient is a very pleasant 61 year old is sent for evaluation of left thigh and hip pain. This is been going on on for a month. She is a diabetic and and states that her glucose has not been under good control. This hurts mainly at night and some activity related. She points to her trochanteric area and IT band in the thigh as source of her pain. She denies any groin pain. She's not taking any kind of medications for this at all. Again this minimal for only about a month. She denies any trauma. She denies any sciatic  symptoms are back pain. She denies a nubs and tingling in her left foot. She denies any weakness in the left leg.  HPI  Review of Systems She currently denies any headache, chest pain, shortness of breath, fever, chills, nausea, vomiting.  Objective: Vital Signs: There were no vitals taken for this visit.  Physical Exam She is alert and oriented 3 in no acute distress. Ortho Exam Gets up on the exam table easily. I can easily rotate both hips round she has some pain on extremes of rotation of left hip but it's only on the lateral aspect. There is no pain in the groin. Her knee foot and ankle exam are normal. Her back exam is normal. Specialty Comments:  No specialty comments available.  Imaging: Xr Lumbar Spine 2-3 Views  Result Date: 06/23/2017 2 views of the lumbar spine show normal-appearing alignment with no acute findings. The disc heights are well maintained.  X-rays on the canopy system of her femur and left hip show no acute findings under my independent review. There is no significant arthritis of the hip  PMFS History: Patient Active Problem List   Diagnosis Date Noted  . Encounter for general adult medical examination with abnormal findings 10/25/2015  . Generalized anxiety disorder 12/25/2014  .  Vitamin D deficiency 12/25/2014  . Medication management 12/25/2014  . Mixed hyperlipidemia 10/22/2013  . Back pain 03/31/2013  . Diabetes (HCC) 03/31/2013  . Tobacco abuse    Past Medical History:  Diagnosis Date  . Anxiety   . Diabetes mellitus without complication (HCC)   . Hx of colonic polyps    Fhx Colon cancer  . Hyperlipidemia   . Hypocholesteremia   . Tobacco abuse     Family History  Problem Relation Age of Onset  . AAA (abdominal aortic aneurysm) Mother   . Hyperlipidemia Mother   . Heart attack Brother 55  . Pneumonia Maternal Grandmother   . Cancer Sister        Colon  . Diabetes Sister   . Heart disease Sister     Past Surgical History:    Procedure Laterality Date  . APPENDECTOMY    . CESAREAN SECTION    . CHOLECYSTECTOMY    . TONSILLECTOMY     Social History   Occupational History  . Not on file.   Social History Main Topics  . Smoking status: Current Some Day Smoker    Packs/day: 0.50    Years: 30.00    Types: Cigarettes  . Smokeless tobacco: Never Used     Comment: working on quitting  . Alcohol use No  . Drug use: No  . Sexual activity: Yes

## 2017-07-18 ENCOUNTER — Other Ambulatory Visit: Payer: Self-pay | Admitting: Physician Assistant

## 2017-07-18 ENCOUNTER — Other Ambulatory Visit: Payer: Self-pay | Admitting: Internal Medicine

## 2017-07-18 DIAGNOSIS — IMO0001 Reserved for inherently not codable concepts without codable children: Secondary | ICD-10-CM

## 2017-07-18 DIAGNOSIS — E1165 Type 2 diabetes mellitus with hyperglycemia: Principal | ICD-10-CM

## 2017-07-23 ENCOUNTER — Ambulatory Visit (INDEPENDENT_AMBULATORY_CARE_PROVIDER_SITE_OTHER): Payer: 59 | Admitting: Orthopaedic Surgery

## 2017-07-29 ENCOUNTER — Ambulatory Visit (INDEPENDENT_AMBULATORY_CARE_PROVIDER_SITE_OTHER): Payer: 59 | Admitting: Orthopaedic Surgery

## 2017-07-29 DIAGNOSIS — M7062 Trochanteric bursitis, left hip: Secondary | ICD-10-CM | POA: Diagnosis not present

## 2017-07-29 NOTE — Progress Notes (Signed)
The patient is following up for treatment of left hip trochanteric bursitis. We will try chest exercises and Voltaren gel and says she doing much better overall. We never had to provide an injection.  On exam her hip exam is basically normal on the left side except for just some slight tenderness over the trochanteric area which is much improved.  I talked her about continuing the stretching exercises and Voltaren gel as needed. If he gets painful enough that she like to try injection area physical therapy she'll let us know. She'll otherwise follow up as needed.

## 2017-08-05 ENCOUNTER — Ambulatory Visit: Payer: Self-pay | Admitting: Physician Assistant

## 2017-08-05 DIAGNOSIS — E119 Type 2 diabetes mellitus without complications: Secondary | ICD-10-CM | POA: Diagnosis not present

## 2017-09-18 ENCOUNTER — Other Ambulatory Visit: Payer: Self-pay | Admitting: Physician Assistant

## 2017-10-11 ENCOUNTER — Other Ambulatory Visit: Payer: Self-pay | Admitting: Internal Medicine

## 2017-10-11 DIAGNOSIS — E1165 Type 2 diabetes mellitus with hyperglycemia: Principal | ICD-10-CM

## 2017-10-11 DIAGNOSIS — IMO0001 Reserved for inherently not codable concepts without codable children: Secondary | ICD-10-CM

## 2017-10-14 DIAGNOSIS — E669 Obesity, unspecified: Secondary | ICD-10-CM | POA: Insufficient documentation

## 2017-10-14 NOTE — Progress Notes (Signed)
FOLLOW UP  Assessment and Plan:   Cholesterol Currently near goal; continue statin  Continue low cholesterol diet and exercise.  Check lipid panel.   Diabetes without complications Continue medication: farxiga 10 mg daily, increase glipizide to 5 mg BID, discussed adding GLP once weekly injectable pending A1C today - consider BCise for weight loss benefits and cost She is concerned about cost of medications; farxiga may no longer be covered - she is to verify with insurance and give [place] a call back for what is covered Continue diet and exercise.  Perform daily foot/skin check, notify office of any concerning changes.  Check A1C  Obesity with co morbidities Long discussion about weight loss, diet, and exercise Recommended diet heavy in fruits and veggies and low in animal meats, cheeses, and dairy products, appropriate calorie intake Discussed ideal weight for height Patient will work on resuming previous diet, start exercising Will follow up in 3 months  Vitamin D Def Very low at last visit; has not started supplementation despite very low vit D levels and recommendation. She reports she did not tolerate very high doses (50,000 units) - recommended she restart with 2000 unit daily dose Check Vit D level, continue to counsel   Continue diet and meds as discussed. Further disposition pending results of labs. Discussed med's effects and SE's.   Over 30 minutes of exam, counseling, chart review, and critical decision making was performed.   Future Appointments  Date Time Provider Department Center  01/21/2018  9:00 AM Judd Gaudier, NP [facility] None   ----------------------------------------------------------------------------------------------------------------------  HPI 62 y.o. female  presents for 3 month follow up on blood pressure, cholesterol, T2 diabetes, obesity and vitamin D deficiency. She reports she has had increased stress related to her daughter going through a  divorce and moving in. She reports she is managing and declines medications for this.   BMI is Body mass index is 29.62 kg/m., she has not been working on diet and exercise. She does plan to improve this in 2019; has joined a new gym. She also plans to improve her diet.  Wt Readings from Last 3 Encounters:  10/15/17 178 lb (80.7 kg)  04/08/17 175 lb 6.4 oz (79.6 kg)  03/02/17 176 lb (79.8 kg)   Today their BP is BP: 110/60  She does not workout. She denies chest pain, shortness of breath, dizziness.   She is on cholesterol medication and denies myalgias. Her cholesterol is not at goal. The cholesterol last visit was:   Lab Results  Component Value Date   CHOL 180 04/08/2017   HDL 41 (L) 04/08/2017   LDLCALC 109 (H) 04/08/2017   TRIG 149 04/08/2017   CHOLHDL 4.4 04/08/2017    She has not been working on diet and exercise for T2 diabetes, and denies hypoglycemia , increased appetite, nausea, paresthesia of the feet, polydipsia, polyuria, visual disturbances and vomiting. She does check sugars sporadically, reports fasting 170-180. Does not check postprandial. Last A1C in the office was:  Lab Results  Component Value Date   HGBA1C 8.8 (H) 04/08/2017   Patient is not on Vitamin D supplement and was very low at the last check:    Lab Results  Component Value Date   VD25OH 9 (L) 10/25/2015      Current Medications:  Current Outpatient Medications on File Prior to Visit  Medication Sig  . aspirin EC 81 MG tablet Take 81 mg by mouth daily.  . dapagliflozin propanediol (FARXIGA) 10 MG TABS tablet Take 10 mg  by mouth daily.  . diclofenac sodium (VOLTAREN) 1 % GEL Apply 2 g topically 4 (four) times daily. (Patient taking differently: Apply 2 g topically as needed. )  . meclizine (ANTIVERT) 25 MG tablet 1/2-1 pill up to 3 times daily for motion sickness/dizziness (Patient taking differently: as needed. 1/2-1 pill up to 3 times daily for motion sickness/dizziness)  . montelukast (SINGULAIR)  10 MG tablet Take 1 tablet (10 mg total) by mouth at bedtime.  . ONE TOUCH ULTRA TEST test strip USE TO TEST BLOOD SUGAR 3 TIMES DAILY OR AS DIRECTED.  Marland Kitchen rosuvastatin (CRESTOR) 20 MG tablet TAKE ONE TABLET AT BEDTIME.  . meloxicam (MOBIC) 15 MG tablet Take one daily with food for 2 weeks, can take with tylenol, can not take with aleve, iburpofen, then as needed daily for pain (Patient not taking: Reported on 10/15/2017)   No current facility-administered medications on file prior to visit.      Allergies:  Allergies  Allergen Reactions  . Levaquin [Levofloxacin] Hives and Other (See Comments)    Tingling sensation to mouth, near LOC, blurred vision,    . Eggs Or Egg-Derived Products     Unknown reaction   . Iodine     Unknown reaction   . Metformin And Related Diarrhea  . Shellfish Allergy     Unknown reaction   . Vitamin D Analogs Diarrhea    High dose Vitamin D caused diarrhea  . Amoxicillin Rash  . Kombiglyze [Saxagliptin-Metformin Er] Other (See Comments)    Bloating  . Latex Itching and Rash  . Penicillins Rash     Medical History:  Past Medical History:  Diagnosis Date  . Anxiety   . Diabetes mellitus without complication (HCC)   . Hx of colonic polyps    Fhx Colon cancer  . Hyperlipidemia   . Hypocholesteremia   . Tobacco abuse    Family history- Reviewed and unchanged Social history- Reviewed and unchanged   Review of Systems:  Review of Systems  Constitutional: Negative for malaise/fatigue and weight loss.  HENT: Negative for hearing loss and tinnitus.   Eyes: Negative for blurred vision and double vision.  Respiratory: Negative for cough, shortness of breath and wheezing.   Cardiovascular: Negative for chest pain, palpitations, orthopnea, claudication and leg swelling.  Gastrointestinal: Negative for abdominal pain, blood in stool, constipation, diarrhea, heartburn, melena, nausea and vomiting.  Genitourinary: Negative.   Musculoskeletal: Negative for  joint pain and myalgias.  Skin: Negative for rash.  Neurological: Negative for dizziness, tingling, sensory change, weakness and headaches.  Endo/Heme/Allergies: Negative for polydipsia.  Psychiatric/Behavioral: Negative.  Negative for depression and substance abuse. The patient is not nervous/anxious and does not have insomnia.   All other systems reviewed and are negative.   Physical Exam: BP 110/60   Pulse 96   Temp 97.6 F (36.4 C)   Ht 5\' 5"  (1.651 m)   Wt 178 lb (80.7 kg)   SpO2 100%   BMI 29.62 kg/m  Wt Readings from Last 3 Encounters:  10/15/17 178 lb (80.7 kg)  04/08/17 175 lb 6.4 oz (79.6 kg)  03/02/17 176 lb (79.8 kg)   General Appearance: Well nourished, in no apparent distress. Eyes: PERRLA, EOMs, conjunctiva no swelling or erythema Sinuses: No Frontal/maxillary tenderness ENT/Mouth: Ext aud canals clear, TMs without erythema, bulging. No erythema, swelling, or exudate on post pharynx.  Tonsils not swollen or erythematous. Hearing normal.  Neck: Supple, thyroid normal.  Respiratory: Respiratory effort normal, BS equal bilaterally without rales,  rhonchi, wheezing or stridor.  Cardio: RRR with no MRGs. Brisk peripheral pulses without edema.  Abdomen: Soft, + BS.  Non tender, no guarding, rebound, hernias, masses. Lymphatics: Non tender without lymphadenopathy.  Musculoskeletal: Full ROM, 5/5 strength, Normal gait Skin: Warm, dry without rashes, lesions, ecchymosis.  Neuro: Cranial nerves intact. No cerebellar symptoms.  Psych: Awake and oriented X 3, normal affect, Insight and Judgment appropriate.    Dan MakerAshley C Jennilee Demarco, NP 12:24 PM [facility]

## 2017-10-15 ENCOUNTER — Encounter: Payer: Self-pay | Admitting: Adult Health

## 2017-10-15 ENCOUNTER — Ambulatory Visit: Payer: 59 | Admitting: Adult Health

## 2017-10-15 VITALS — BP 110/60 | HR 96 | Temp 97.6°F | Ht 65.0 in | Wt 178.0 lb

## 2017-10-15 DIAGNOSIS — E782 Mixed hyperlipidemia: Secondary | ICD-10-CM

## 2017-10-15 DIAGNOSIS — E559 Vitamin D deficiency, unspecified: Secondary | ICD-10-CM | POA: Diagnosis not present

## 2017-10-15 DIAGNOSIS — Z79899 Other long term (current) drug therapy: Secondary | ICD-10-CM

## 2017-10-15 DIAGNOSIS — Z720 Tobacco use: Secondary | ICD-10-CM | POA: Diagnosis not present

## 2017-10-15 DIAGNOSIS — F411 Generalized anxiety disorder: Secondary | ICD-10-CM | POA: Diagnosis not present

## 2017-10-15 DIAGNOSIS — E1165 Type 2 diabetes mellitus with hyperglycemia: Secondary | ICD-10-CM | POA: Diagnosis not present

## 2017-10-15 DIAGNOSIS — IMO0001 Reserved for inherently not codable concepts without codable children: Secondary | ICD-10-CM

## 2017-10-15 DIAGNOSIS — E119 Type 2 diabetes mellitus without complications: Secondary | ICD-10-CM

## 2017-10-15 DIAGNOSIS — E66811 Obesity, class 1: Secondary | ICD-10-CM

## 2017-10-15 DIAGNOSIS — E669 Obesity, unspecified: Secondary | ICD-10-CM

## 2017-10-15 MED ORDER — CITALOPRAM HYDROBROMIDE 20 MG PO TABS: ORAL_TABLET | ORAL | 1 refills | Status: DC

## 2017-10-15 MED ORDER — GLIPIZIDE 5 MG PO TABS: ORAL_TABLET | ORAL | 1 refills | Status: DC

## 2017-10-15 NOTE — Patient Instructions (Signed)
Please confirm with your insurance what medications will be covered; we will likely need to start another medication until weight loss kicks in and sugars start improving.   Please keep a log of sugars - ideally check routine fasting, as well as rotating postprandials.   Targets for Glucose Readings: Time of Check Usual Target for Most People  Before Meals  70-130  Two hours after meals  Less than 180  Bedtime  90-150     Your can increase glipizide to 1 tab twice a day with meals - remember this medication CAN drop your sugar too low if you don't eat with it. . This medication forces your blood sugar down no matter what it is starting at. Only take 1/2 of the medication if your sugar is above 120 and you can take a whole pill if your sugar is above 150 in the morning. If at any time you start to have low blood sugars in the morning or during the day please stop this medication. Please never take this medication if you are sick or can not eat. A low blood sugar is much more dangerous than a high blood sugar. Your brain needs two things, sugar and oxygen.          Bad carbs also include fruit juice, alcohol, and sweet tea. These are empty calories that do not signal to your brain that you are full.   Please remember the good carbs are still carbs which convert into sugar. So please measure them out no more than 1/2-1 cup of rice, oatmeal, pasta, and beans  Veggies are however free foods! Pile them on.   Not all fruit is created equal. Please see the list below, the fruit at the bottom is higher in sugars than the fruit at the top. Please avoid all dried fruits.

## 2017-10-16 ENCOUNTER — Other Ambulatory Visit: Payer: Self-pay | Admitting: Adult Health

## 2017-10-16 DIAGNOSIS — E1165 Type 2 diabetes mellitus with hyperglycemia: Principal | ICD-10-CM

## 2017-10-16 DIAGNOSIS — IMO0001 Reserved for inherently not codable concepts without codable children: Secondary | ICD-10-CM

## 2017-10-16 LAB — CBC WITH DIFFERENTIAL/PLATELET
BASOS ABS: 38 {cells}/uL (ref 0–200)
Basophils Relative: 0.6 %
EOS PCT: 2.1 %
Eosinophils Absolute: 132 cells/uL (ref 15–500)
HCT: 48.6 % — ABNORMAL HIGH (ref 35.0–45.0)
Hemoglobin: 16.5 g/dL — ABNORMAL HIGH (ref 11.7–15.5)
Lymphs Abs: 1436 cells/uL (ref 850–3900)
MCH: 30.7 pg (ref 27.0–33.0)
MCHC: 34 g/dL (ref 32.0–36.0)
MCV: 90.3 fL (ref 80.0–100.0)
MPV: 10.9 fL (ref 7.5–12.5)
Monocytes Relative: 5.7 %
Neutro Abs: 4334 cells/uL (ref 1500–7800)
Neutrophils Relative %: 68.8 %
Platelets: 231 10*3/uL (ref 140–400)
RBC: 5.38 10*6/uL — ABNORMAL HIGH (ref 3.80–5.10)
RDW: 12.2 % (ref 11.0–15.0)
Total Lymphocyte: 22.8 %
WBC: 6.3 10*3/uL (ref 3.8–10.8)
WBCMIX: 359 {cells}/uL (ref 200–950)

## 2017-10-16 LAB — LIPID PANEL
CHOL/HDL RATIO: 5.9 (calc) — AB (ref <5.0)
Cholesterol: 232 mg/dL — ABNORMAL HIGH (ref <200)
HDL: 39 mg/dL — ABNORMAL LOW (ref >50)
LDL CHOLESTEROL (CALC): 142 mg/dL — AB
NON-HDL CHOLESTEROL (CALC): 193 mg/dL — AB (ref <130)
TRIGLYCERIDES: 329 mg/dL — AB (ref <150)

## 2017-10-16 LAB — HEPATIC FUNCTION PANEL
AG RATIO: 2.1 (calc) (ref 1.0–2.5)
ALBUMIN MSPROF: 4.8 g/dL (ref 3.6–5.1)
ALT: 18 U/L (ref 6–29)
AST: 15 U/L (ref 10–35)
Alkaline phosphatase (APISO): 109 U/L (ref 33–130)
BILIRUBIN TOTAL: 0.6 mg/dL (ref 0.2–1.2)
Bilirubin, Direct: 0.1 mg/dL (ref 0.0–0.2)
Globulin: 2.3 g/dL (calc) (ref 1.9–3.7)
Indirect Bilirubin: 0.5 mg/dL (calc) (ref 0.2–1.2)
Total Protein: 7.1 g/dL (ref 6.1–8.1)

## 2017-10-16 LAB — BASIC METABOLIC PANEL WITH GFR
BUN: 14 mg/dL (ref 7–25)
CALCIUM: 9.9 mg/dL (ref 8.6–10.4)
CO2: 25 mmol/L (ref 20–32)
CREATININE: 0.79 mg/dL (ref 0.50–1.05)
Chloride: 103 mmol/L (ref 98–110)
GFR, Est African American: 95 mL/min/{1.73_m2} (ref >=60)
GFR, Est Non African American: 82 mL/min/{1.73_m2} (ref >=60)
Glucose, Bld: 295 mg/dL — ABNORMAL HIGH (ref 65–99)
Potassium: 4.6 mmol/L (ref 3.5–5.3)
SODIUM: 139 mmol/L (ref 135–146)

## 2017-10-16 LAB — TSH: TSH: 0.66 m[IU]/L (ref 0.40–4.50)

## 2017-10-16 LAB — VITAMIN D 25 HYDROXY (VIT D DEFICIENCY, FRACTURES): Vit D, 25-Hydroxy: 9 ng/mL — ABNORMAL LOW (ref 30–100)

## 2017-10-16 LAB — HEMOGLOBIN A1C
EAG (MMOL/L): 13 (calc)
Hgb A1c MFr Bld: 9.8 % of total Hgb — ABNORMAL HIGH (ref <5.7)
Mean Plasma Glucose: 235 (calc)

## 2017-10-16 MED ORDER — GLIPIZIDE 5 MG PO TABS: ORAL_TABLET | ORAL | 1 refills | Status: DC

## 2017-11-24 NOTE — Progress Notes (Signed)
Diabetes Education and Follow-Up Visit  62 y.o.female presents for diabetic education. She has Diabetes Mellitus type 2:  without complications, she is on bASA, and denies foot ulcerations, hyperglycemia, hypoglycemia , increased appetite, nausea, paresthesia of the feet, polydipsia, polyuria, visual disturbances, vomiting and weight loss.  Last hemoglobin A1c was: Lab Results  Component Value Date   HGBA1C 9.8 (H) 10/15/2017   HGBA1C 8.8 (H) 04/08/2017   HGBA1C 9.0 (H) 11/04/2016   BMI is Body mass index is 29.62 kg/m., she has been working on diet and exercise. Wt Readings from Last 3 Encounters:  11/25/17 178 lb (80.7 kg)  10/15/17 178 lb (80.7 kg)  04/08/17 175 lb 6.4 oz (79.6 kg)   Pt is on a regimen of:  Farxiga 10 mg daily - insurance will no longer cover Reportedly Invokana or jardiance would be covered - will initiate jardiance 25 mg daily  Glipizide 5 mg  taking 1 tab BID  Reportedly had diarrhea with metformin in the past, cannot recall if she did a very slow taper to start or not. Willing to retry.   Pt checks her sugars 1 x day  Checks fasting, has been running 190s but did not present with sugar log today Lowest sugar was ?Marland Kitchen.  She has hypoglycemia awareness.  Highest sugar was ?.  Glucometer: ?  Exercise: has been talking around the block once daily - 15 min daily, also walks a lot at work, waiting for gym near house to open in March and plans to start going  Diet: has been trying to increase salads, vegetables, chicken, yogurt (low carb/low sugar)  Patient does not have CKD She is not on ACE/ARB  Lab Results  Component Value Date   GFRAA 95 10/15/2017     Lab Results  Component Value Date   GFRNONAA 82 10/15/2017    Lab Results  Component Value Date   CREATININE 0.79 10/15/2017   BUN 14 10/15/2017   NA 139 10/15/2017   K 4.6 10/15/2017   CL 103 10/15/2017   CO2 25 10/15/2017   Lab Results  Component Value Date   MICROALBUR 0.2 10/25/2015     She is on a Statin - crestor 20 mg - she increased to daily but is having myalgias. She reports she tolerated 3 times a week well.  She is not at goal of less than 70.  Lab Results  Component Value Date   CHOL 232 (H) 10/15/2017   HDL 39 (L) 10/15/2017   LDLCALC 109 (H) 04/08/2017   TRIG 329 (H) 10/15/2017   CHOLHDL 5.9 (H) 10/15/2017     Problem List has Tobacco abuse; Back pain; Diabetes (HCC); Mixed hyperlipidemia; Generalized anxiety disorder; Vitamin D deficiency; Medication management; Trochanteric bursitis of left hip; and Obesity (BMI 30.0-34.9) on their problem list.  Medications Current Outpatient Medications on File Prior to Visit  Medication Sig  . aspirin EC 81 MG tablet Take 81 mg by mouth daily.  . Cholecalciferol (VITAMIN D3) 2000 units TABS Take 4,000 Units by mouth.  . citalopram (CELEXA) 20 MG tablet Take 1 tablet by mouth each day.  . dapagliflozin propanediol (FARXIGA) 10 MG TABS tablet Take 10 mg by mouth daily.  . diclofenac sodium (VOLTAREN) 1 % GEL Apply 2 g topically 4 (four) times daily. (Patient taking differently: Apply 2 g topically as needed. )  . glipiZIDE (GLUCOTROL) 5 MG tablet TAKE 1 TABLET WITH EACH MEAL.  Marland Kitchen. meclizine (ANTIVERT) 25 MG tablet 1/2-1 pill up to 3 times  daily for motion sickness/dizziness (Patient taking differently: as needed. 1/2-1 pill up to 3 times daily for motion sickness/dizziness)  . meloxicam (MOBIC) 15 MG tablet Take one daily with food for 2 weeks, can take with tylenol, can not take with aleve, iburpofen, then as needed daily for pain  . montelukast (SINGULAIR) 10 MG tablet Take 1 tablet (10 mg total) by mouth at bedtime.  . ONE TOUCH ULTRA TEST test strip USE TO TEST BLOOD SUGAR 3 TIMES DAILY OR AS DIRECTED.  Marland Kitchen rosuvastatin (CRESTOR) 20 MG tablet TAKE ONE TABLET AT BEDTIME.   No current facility-administered medications on file prior to visit.     ROS- see HPI  Physical Exam: Blood pressure 110/64, pulse 95,  temperature (!) 97.3 F (36.3 C), height 5\' 5"  (1.651 m), weight 178 lb (80.7 kg), SpO2 95 %. Body mass index is 29.62 kg/m. General Appearance: Well nourished, in no apparent distress. Eyes: PERRLA, EOMs, conjunctiva no swelling or erythema ENT/Mouth: Ext aud canals clear, TMs without erythema, bulging. No erythema, swelling, or exudate on post pharynx.  Tonsils not swollen or erythematous. Hearing normal.  Respiratory: Respiratory effort normal, BS equal bilaterally with rhonchi in bilateral bases with cough, no rales wheezing or stridor.  Cardio: RRR with no MRGs. Brisk peripheral pulses without edema.  Abdomen: Soft, + BS.  Non tender, no guarding, rebound, hernias, masses. Musculoskeletal: Full ROM, 5/5 strength, normal gait.  Skin: Warm, dry without rashes, lesions, ecchymosis.  Neuro: Cranial nerves intact. Normal muscle tone, no cerebellar symptoms. Sensation intact.    Plan and Assessment: Diabetes Education: Reviewed 'ABCs' of diabetes management (respective goals in parentheses):  A1C (<7), blood pressure (<130/80), and cholesterol (LDL <70) Eye Exam yearly and Dental Exam every 6 months. She report she has had her annual diabetic eye exam this past year. Report requested today.  Foot exam performed and normal today.  She is overdue for microalbumin - attempted to collect specimen today but unable to do so. Will obtain at follow up. Dietary recommendations - discussed, she is to keep food log with carb tracking Physical Activity recommendations discussed - goal of 150 min weekly, at least every other day Weight loss goal: 135 lb over next 2 years. Initial goal: 175 lb - Strongly advised her to start checking sugars at different times of the day - check 2 times a day, rotating checks - given sugar log and advised how to fill it and to bring it at next appt  - given foot care handout and explained the principles  - given instructions for hypoglycemia management    Future  Appointments  Date Time Provider Department Center  01/21/2018  9:00 AM Judd Gaudier, NP [facility] None

## 2017-11-25 ENCOUNTER — Ambulatory Visit: Payer: 59 | Admitting: Adult Health

## 2017-11-25 ENCOUNTER — Encounter: Payer: Self-pay | Admitting: Adult Health

## 2017-11-25 ENCOUNTER — Other Ambulatory Visit: Payer: Self-pay

## 2017-11-25 VITALS — BP 110/64 | HR 95 | Temp 97.3°F | Ht 65.0 in | Wt 178.0 lb

## 2017-11-25 DIAGNOSIS — Z1389 Encounter for screening for other disorder: Secondary | ICD-10-CM | POA: Diagnosis not present

## 2017-11-25 DIAGNOSIS — Z79899 Other long term (current) drug therapy: Secondary | ICD-10-CM

## 2017-11-25 DIAGNOSIS — J40 Bronchitis, not specified as acute or chronic: Secondary | ICD-10-CM

## 2017-11-25 DIAGNOSIS — E119 Type 2 diabetes mellitus without complications: Secondary | ICD-10-CM | POA: Diagnosis not present

## 2017-11-25 DIAGNOSIS — E669 Obesity, unspecified: Secondary | ICD-10-CM

## 2017-11-25 DIAGNOSIS — E782 Mixed hyperlipidemia: Secondary | ICD-10-CM

## 2017-11-25 MED ORDER — METFORMIN HCL ER 500 MG PO TB24: 500.0000 mg | ORAL_TABLET | Freq: Every day | ORAL | 0 refills | Status: DC

## 2017-11-25 MED ORDER — DOXYCYCLINE HYCLATE 100 MG PO TABS: 100.0000 mg | ORAL_TABLET | Freq: Two times a day (BID) | ORAL | 0 refills | Status: AC

## 2017-11-25 MED ORDER — EMPAGLIFLOZIN 25 MG PO TABS: 25.0000 mg | ORAL_TABLET | Freq: Every day | ORAL | 3 refills | Status: DC

## 2017-11-25 NOTE — Patient Instructions (Addendum)
Sugar log with twice daily - once fasting, once post-prandial (2 hours after a meal)  Daily food log - + carb tracking - write down carb - fiber- sugar content for quantity consumed, and total calories per day  Start metformin very slow - take 1/2 tab once daily with largest meal for 2 weeks, if doing alright can go up to 1/2 tab twice a day with meals x 2 weeks, etc increase very slowly to max 4 tabs daily with meals. If you start having symptoms go down to highest tolerated dose.   Water goal : 1/2 your body weight in fluid ounces  Aim for 7+ servings of fruits and vegetables daily  Limit animal fats in diet for cholesterol and heart health - choose grass fed whenever available  Aim for low stress - take time to unwind and care for your mental health  Aim for 150 min of moderate intensity exercise weekly for heart health, and weights twice weekly for bone health  Aim for 7-9 hours of sleep daily     Are you an emotional eater? Do you eat more when you're feeling stressed? Do you eat when you're not hungry or when you're full? Do you eat to feel better (to calm and soothe yourself when you're sad, mad, bored, anxious, etc.)? Do you reward yourself with food? Do you regularly eat until you've stuffed yourself? Does food make you feel safe? Do you feel like food is a friend? Do you feel powerless or out of control around food?  If you answered yes to some of these questions than it is likely that you are an emotional eater. This is normally a learned behavior and can take time to first recognize the signs and second BREAK THE HABIT. But here is more information and tips to help.   The difference between emotional hunger and physical hunger Emotional hunger can be powerful, so it's easy to mistake it for physical hunger. But there are clues you can look for to help you tell physical and emotional hunger apart.  Emotional hunger comes on suddenly. It hits you in an instant and feels  overwhelming and urgent. Physical hunger, on the other hand, comes on more gradually. The urge to eat doesn't feel as dire or demand instant satisfaction (unless you haven't eaten for a very long time).  Emotional hunger craves specific comfort foods. When you're physically hungry, almost anything sounds good-including healthy stuff like vegetables. But emotional hunger craves junk food or sugary snacks that provide an instant rush. You feel like you need cheesecake or pizza, and nothing else will do.  Emotional hunger often leads to mindless eating. Before you know it, you've eaten a whole bag of chips or an entire pint of ice cream without really paying attention or fully enjoying it. When you're eating in response to physical hunger, you're typically more aware of what you're doing.  Emotional hunger isn't satisfied once you're full. You keep wanting more and more, often eating until you're uncomfortably stuffed. Physical hunger, on the other hand, doesn't need to be stuffed. You feel satisfied when your stomach is full.  Emotional hunger isn't located in the stomach. Rather than a growling belly or a pang in your stomach, you feel your hunger as a craving you can't get out of your head. You're focused on specific textures, tastes, and smells.  Emotional hunger often leads to regret, guilt, or shame. When you eat to satisfy physical hunger, you're unlikely to feel guilty or  ashamed because you're simply giving your body what it needs. If you feel guilty after you eat, it's likely because you know deep down that you're not eating for nutritional reasons.  Identify your emotional eating triggers What situations, places, or feelings make you reach for the comfort of food? Most emotional eating is linked to unpleasant feelings, but it can also be triggered by positive emotions, such as rewarding yourself for achieving a goal or celebrating a holiday or happy event. Common causes of emotional eating  include:  Stuffing emotions - Eating can be a way to temporarily silence or "stuff down" uncomfortable emotions, including anger, fear, sadness, anxiety, loneliness, resentment, and shame. While you're numbing yourself with food, you can avoid the difficult emotions you'd rather not feel.  Boredom or feelings of emptiness - Do you ever eat simply to give yourself something to do, to relieve boredom, or as a way to fill a void in your life? You feel unfulfilled and empty, and food is a way to occupy your mouth and your time. In the moment, it fills you up and distracts you from underlying feelings of purposelessness and dissatisfaction with your life.  Childhood habits - Think back to your childhood memories of food. Did your parents reward good behavior with ice cream, take you out for pizza when you got a good report card, or serve you sweets when you were feeling sad? These habits can often carry over into adulthood. Or your eating may be driven by nostalgia-for cherished memories of grilling burgers in the backyard with your dad or baking and eating cookies with your mom.  Social influences - Getting together with other people for a meal is a great way to relieve stress, but it can also lead to overeating. It's easy to overindulge simply because the food is there or because everyone else is eating. You may also overeat in social situations out of nervousness. Or perhaps your family or circle of friends encourages you to overeat, and it's easier to go along with the group.  Stress - Ever notice how stress makes you hungry? It's not just in your mind. When stress is chronic, as it so often is in our chaotic, fast-paced world, your body produces high levels of the stress hormone, cortisol. Cortisol triggers cravings for salty, sweet, and fried foods-foods that give you a burst of energy and pleasure. The more uncontrolled stress in your life, the more likely you are to turn to food for emotional  relief.  Find other ways to feed your feelings If you don't know how to manage your emotions in a way that doesn't involve food, you won't be able to control your eating habits for very long. Diets so often fail because they offer logical nutritional advice which only works if you have conscious control over your eating habits. It doesn't work when emotions hijack the process, demanding an immediate payoff with food.  In order to stop emotional eating, you have to find other ways to fulfill yourself emotionally. It's not enough to understand the cycle of emotional eating or even to understand your triggers, although that's a huge first step. You need alternatives to food that you can turn to for emotional fulfillment.  Alternatives to emotional eating If you're depressed or lonely, call someone who always makes you feel better, play with your dog or cat, or look at a favorite photo or cherished memento.  If you're anxious, expend your nervous energy by dancing to your favorite song, squeezing  a stress ball, or taking a brisk walk.  If you're exhausted, treat yourself with a hot cup of tea, take a bath, light some scented candles, or wrap yourself in a warm blanket.  If you're bored, read a good book, watch a comedy show, explore the outdoors, or turn to an activity you enjoy (woodworking, playing the guitar, shooting hoops, scrapbooking, etc.).  What is mindful eating? Mindful eating is a practice that develops your awareness of eating habits and allows you to pause between your triggers and your actions. Most emotional eaters feel powerless over their food cravings. When the urge to eat hits, you feel an almost unbearable tension that demands to be fed, right now. Because you've tried to resist in the past and failed, you believe that your willpower just isn't up to snuff. But the truth is that you have more power over your cravings than you think.  Take 5 before you give in to a  craving Emotional eating tends to be automatic and virtually mindless. Before you even realize what you're doing, you've reached for a tub of ice cream and polished off half of it. But if you can take a moment to pause and reflect when you're hit with a craving, you give yourself the opportunity to make a different decision.  Can you put off eating for five minutes? Or just start with one minute. Don't tell yourself you can't give in to the craving; remember, the forbidden is extremely tempting. Just tell yourself to wait.  While you're waiting, check in with yourself. How are you feeling? What's going on emotionally? Even if you end up eating, you'll have a better understanding of why you did it. This can help you set yourself up for a different response next time.  How to practice mindful eating Eating while you're also doing other things-such as watching TV, driving, or playing with your phone-can prevent you from fully enjoying your food. Since your mind is elsewhere, you may not feel satisfied or continue eating even though you're no longer hungry. Eating more mindfully can help focus your mind on your food and the pleasure of a meal and curb overeating.   Eat your meals in a calm place with no distractions, aside from any dining companions.  Try eating with your non-dominant hand or using chopsticks instead of a knife and fork. Eating in such a non-familiar way can slow down how fast you eat and ensure your mind stays focused on your food.  Allow yourself enough time not to have to rush your meal. Set a timer for 20 minutes and pace yourself so you spend at least that much time eating.  Take small bites and chew them well, taking time to notice the different flavors and textures of each mouthful.  Put your utensils down between bites. Take time to consider how you feel-hungry, satiated-before picking up your utensils again.  Try to stop eating before you are full.It takes time for the  signal to reach your brain that you've had enough. Don't feel obligated to always clean your plate.  When you've finished your food, take a few moments to assess if you're really still hungry before opting for an extra serving or dessert.  Learn to accept your feelings-even the bad ones  While it may seem that the core problem is that you're powerless over food, emotional eating actually stems from feeling powerless over your emotions. You don't feel capable of dealing with your feelings head on, so you  avoid them with food.  Recommended reading  Mini Habits for weight loss  Healthy Eating: A guide to the new nutrition Copy Special Health Report  10 Tips for Mindful Eating - How mindfulness can help you fully enjoy a meal and the experience of eating-with moderation and restraint. [facility] Health blog)  Weight Loss: Gain Control of Emotional Eating - Tips to regain control of your eating habits. [facility])  Why Stress Causes People to Overeat -Tips on controlling stress eating. [employer])  Mindful Eating Meditations -Free online mindfulness meditations. ([facility])

## 2017-11-26 ENCOUNTER — Other Ambulatory Visit: Payer: Self-pay | Admitting: Adult Health

## 2017-11-26 ENCOUNTER — Encounter: Payer: Self-pay | Admitting: Adult Health

## 2017-11-26 DIAGNOSIS — E119 Type 2 diabetes mellitus without complications: Secondary | ICD-10-CM

## 2017-11-26 MED ORDER — METFORMIN HCL ER 500 MG PO TB24: ORAL_TABLET | ORAL | 1 refills | Status: DC

## 2017-12-18 ENCOUNTER — Other Ambulatory Visit: Payer: Self-pay | Admitting: Internal Medicine

## 2017-12-18 ENCOUNTER — Ambulatory Visit
Admission: RE | Admit: 2017-12-18 | Discharge: 2017-12-18 | Disposition: A | Discharge status: Home / Self Care | Payer: 59 | Source: Ambulatory Visit | Attending: Internal Medicine | Admitting: Internal Medicine

## 2017-12-18 ENCOUNTER — Ambulatory Visit (INDEPENDENT_AMBULATORY_CARE_PROVIDER_SITE_OTHER): Payer: 59 | Admitting: Internal Medicine

## 2017-12-18 VITALS — BP 100/70 | HR 74 | Temp 98.1°F | Ht 64.0 in | Wt 174.0 lb

## 2017-12-18 DIAGNOSIS — E785 Hyperlipidemia, unspecified: Secondary | ICD-10-CM | POA: Diagnosis not present

## 2017-12-18 DIAGNOSIS — R05 Cough: Secondary | ICD-10-CM

## 2017-12-18 DIAGNOSIS — J22 Unspecified acute lower respiratory infection: Secondary | ICD-10-CM | POA: Diagnosis not present

## 2017-12-18 DIAGNOSIS — Z87891 Personal history of nicotine dependence: Secondary | ICD-10-CM

## 2017-12-18 DIAGNOSIS — E1169 Type 2 diabetes mellitus with other specified complication: Secondary | ICD-10-CM

## 2017-12-18 DIAGNOSIS — E1165 Type 2 diabetes mellitus with hyperglycemia: Secondary | ICD-10-CM | POA: Diagnosis not present

## 2017-12-18 DIAGNOSIS — R053 Chronic cough: Secondary | ICD-10-CM

## 2017-12-18 DIAGNOSIS — R059 Cough, unspecified: Secondary | ICD-10-CM

## 2017-12-18 MED ORDER — CEFTRIAXONE SODIUM 1 G IJ SOLR
1.0000 g | Freq: Once | INTRAMUSCULAR | Status: AC
Start: 2017-12-18 — End: 2017-12-18
  Administered 2017-12-18: 1 g via INTRAMUSCULAR

## 2017-12-18 MED ORDER — BUDESONIDE-FORMOTEROL FUMARATE 160-4.5 MCG/ACT IN AERO: 2.0000 | INHALATION_SPRAY | Freq: Two times a day (BID) | RESPIRATORY_TRACT | 3 refills | Status: DC

## 2017-12-18 MED ORDER — CLARITHROMYCIN 500 MG PO TABS: 500.0000 mg | ORAL_TABLET | Freq: Two times a day (BID) | ORAL | 0 refills | Status: DC

## 2017-12-18 MED ORDER — BENZONATATE 100 MG PO CAPS: 100.0000 mg | ORAL_CAPSULE | Freq: Three times a day (TID) | ORAL | 2 refills | Status: DC | PRN

## 2017-12-18 NOTE — Progress Notes (Signed)
   Subjective:    Patient ID: Candice [name], female    DOB: 11-25-1955, 62 y.o.   MRN: [id number]  HPI Several week history of cough. History of smoking. Has cut down to 1-2 cigarettes a day. Most ever smoked was about a half ppd. Has been running low grade fever in 99 degree range which is unusual for pt. No myalgias. Cough sounds congested. Just finished 10 day course of doxycycline about a week ago. Still had hacking cough with clear phlegm. Now with stuffy nose and discolored sputum.  Hx DM and AIC was 9.8% in January 2019 and 9.0% January 2018.Is on [name], glipizide, metformin.Does not check accuchecks regularly.  Take Crestor 20 mg daily.Recent lipid panel showed total cholesterol 232.HDL 39, triglycerides 329, LDL 142.  Now on 4000 units Vitamin D daily. Says she cannot tolerate high dose Vitamin D.  Dr. Lowell GuitarPowell does Gyn exams. Hx breast surgery right 1971 traumatic mass age 62  PMH: T and A, C-section, Cholecystectomy, appendectomy     ALLERGIC to Levaquin-anaphylaxis 4 years ago, Penicliin causes rash, Kombiglyze caused bloating,Shellfish allergy,Metformin causes diarrhea,latex causes itching, reported egg allergy  SHx: Married, one adult daughter. One grandson. Husband retired from EMS. 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   FHX: Mother now deceased with history of AAA. Sister with hx of colon cancer. Heart disease in brother  and sister. Sister with DM. Mother with hyperlipidemia. Father died age 60 in MVA.  Says quit smoking January 2019 formerly half ppd x 30 years.  Review of Systems     Objective:   Physical Exam  Constitutional: She appears well-developed and well-nourished. No distress.  HENT:  Head: Normocephalic and atraumatic.  Right Ear: External ear normal.  Left Ear: External ear normal.  Mouth/Throat: Oropharynx is clear and moist.  Neck: Neck supple.  Cardiovascular: Normal rate, regular rhythm and normal heart sounds.  Pulmonary/Chest:  Effort normal and breath sounds normal. No respiratory distress. She has no wheezes. She has no rales.  Abdominal: Soft.  Musculoskeletal: She exhibits no edema.  Lymphadenopathy:    She has no cervical adenopathy.  Skin: Skin is warm and dry. No rash noted. She is not diaphoretic.  Psychiatric: She has a normal mood and affect. Her behavior is normal. Judgment and thought content normal.  Vitals reviewed.         Assessment & Plan:  Acute lower respiratory infection that is protracted and has been going on for weeks.  Former smoker.  May have COPD.  Use Symbicort and albuterol inhalers  Poorly controlled diabetes mellitus  Hyperlipidemia-triglycerides markedly elevated despite being on Crestor.  This is likely due to poorly controlled diabetes and dietary habits but may need additional medication  Plan: She will have chest x-ray today.  Given 1 g IM Rocephin and started on Biaxin 500 mg twice daily for 10 days.  Reevaluate on Monday, March 11  Referral to Endocrinology to get diabetes under better control.  Patient may have COPD and may need pulmonary evaluation.  Addendum December 21, 2017: Chest x-ray was negative.  Still coughing.  Depo-Medrol 80 mg IM given.  December 22, 2017-patient says Depo-Medrol helped a great deal and she slept much better last night with less coughing.

## 2017-12-21 ENCOUNTER — Ambulatory Visit (INDEPENDENT_AMBULATORY_CARE_PROVIDER_SITE_OTHER): Payer: 59 | Admitting: Internal Medicine

## 2017-12-21 VITALS — BP 110/78 | HR 88 | Temp 98.1°F

## 2017-12-21 DIAGNOSIS — R05 Cough: Secondary | ICD-10-CM | POA: Diagnosis not present

## 2017-12-21 DIAGNOSIS — R059 Cough, unspecified: Secondary | ICD-10-CM

## 2017-12-21 MED ORDER — METHYLPREDNISOLONE ACETATE 80 MG/ML IJ SUSP
80.0000 mg | Freq: Once | INTRAMUSCULAR | Status: AC
  Administered 2017-12-21: 80 mg via INTRAMUSCULAR

## 2017-12-21 NOTE — Patient Instructions (Signed)
Depomedrol given IM. Moniotr glucose. Pulmonary referral.

## 2017-12-21 NOTE — Progress Notes (Signed)
   Subjective:    Patient ID: Randell PatientJoni D Wrench, female    DOB: 07-23-1956, 62 y.o.   MRN: [id number]  HPI Seen Friday March 8th and placed on Biaxin and given Rocephin. Has Symbicort and albuterol inhalers. Still coughing. Depomedrol IM given. CXR was negative    Review of Systems     Objective:   Physical Exam        Assessment & Plan:

## 2017-12-23 ENCOUNTER — Other Ambulatory Visit: Payer: Self-pay | Admitting: Internal Medicine

## 2017-12-23 ENCOUNTER — Encounter: Payer: Self-pay | Admitting: Internal Medicine

## 2017-12-23 DIAGNOSIS — R05 Cough: Secondary | ICD-10-CM

## 2017-12-23 DIAGNOSIS — R059 Cough, unspecified: Secondary | ICD-10-CM

## 2017-12-23 DIAGNOSIS — E1165 Type 2 diabetes mellitus with hyperglycemia: Secondary | ICD-10-CM

## 2017-12-23 DIAGNOSIS — R053 Chronic cough: Secondary | ICD-10-CM

## 2017-12-23 DIAGNOSIS — Z87891 Personal history of nicotine dependence: Secondary | ICD-10-CM

## 2017-12-23 NOTE — Patient Instructions (Signed)
1 g IM Rocephin.  Biaxin 500 mg twice daily for 10 days.  Chest x-ray.  Referral to endocrinology for poorly controlled diabetes.  Consider pulmonology referral to evaluate for COPD.  Use Symbicort inhaler and albuterol inhaler.

## 2017-12-28 ENCOUNTER — Other Ambulatory Visit: Payer: Self-pay

## 2017-12-28 MED ORDER — MONTELUKAST SODIUM 10 MG PO TABS: 10.0000 mg | ORAL_TABLET | Freq: Every day | ORAL | 3 refills | Status: DC

## 2017-12-31 ENCOUNTER — Other Ambulatory Visit: Payer: Self-pay | Admitting: Internal Medicine

## 2018-01-05 ENCOUNTER — Encounter: Payer: Self-pay | Admitting: Internal Medicine

## 2018-01-05 ENCOUNTER — Telehealth: Payer: Self-pay | Admitting: Internal Medicine

## 2018-01-05 MED ORDER — HYDROCODONE-HOMATROPINE 5-1.5 MG/5ML PO SYRP: 5.0000 mL | ORAL_SOLUTION | Freq: Three times a day (TID) | ORAL | 0 refills | Status: DC | PRN

## 2018-01-05 NOTE — Telephone Encounter (Signed)
Still with cough. Worse at night. Some yellowish sputum. Deep congested sounding cough. Chest clear in office today. Appt April 11 to see Pulmonary. Take Mucinex and Rx Hycodan hs since having issues with sleep due to cough

## 2018-01-20 NOTE — Progress Notes (Deleted)
FOLLOW UP  Assessment and Plan:   Blood pressure Well controlled by lifestyle without medications at this time Monitor blood pressure at home; patient to call if consistently greater than 130/80 Continue DASH diet.   Reminder to go to the ER if any CP, SOB, nausea, dizziness, severe HA, changes vision/speech, left arm numbness and tingling and jaw pain.  Cholesterol Currently above goal; discussed compliance/tolerance - crestor *** Continue low cholesterol diet and exercise.  Check lipid panel.   Diabetes with other circulatory complications Continue medication: jardiance 25 mg daily, glipizide 5 mg BID, metformin *** Continue diet and exercise.  Perform daily foot/skin check, notify office of any concerning changes.  Check A1C  Overweight with co morbidities Long discussion about weight loss, diet, and exercise Recommended diet heavy in fruits and veggies and low in animal meats, cheeses, and dairy products, appropriate calorie intake Discussed ideal weight for height (below ***) and initial weight goal (***) Patient will work on *** Will follow up in 3 months  Vitamin D Def Below goal at last visit; patient has *** started supplementation Check Vit D level  Continue diet and meds as discussed. Further disposition pending results of labs. Discussed med's effects and SE's.   Over 30 minutes of exam, counseling, chart review, and critical decision making was performed.   Future Appointments  Date Time Provider Department Center  01/21/2018  9:00 AM Judd Gaudier, NP [facility] None  01/21/2018  3:15 PM Mannam, Praveen, MD [facility] None    ----------------------------------------------------------------------------------------------------------------------  HPI 62 y.o. female  presents for 3 month follow up on blood pressure, cholesterol, T2 diabetes, obesity and vitamin D deficiency. She was seen for a DM education visit due to A1C jump to 9.8% and was started on  jardiance 25 mg daily, continued glipizide 5 mg BID, and was agreeable to retrying a slow taper of metformin - (previously had diarrhea)  She reports she has had increased stress related to her daughter going through a divorce and moving in. She reports she is managing and declines medications for this.   Former smoker?   BMI is There is no height or weight on file to calculate BMI., she {HAS HAS ZOX:09604} been working on diet and exercise. Wt Readings from Last 3 Encounters:  12/18/17 174 lb (78.9 kg)  11/25/17 178 lb (80.7 kg)  10/15/17 178 lb (80.7 kg)   Her blood pressure {HAS HAS NOT:18834} been controlled at home, today their BP is    She {DOES_DOES VWU:98119} workout. She denies chest pain, shortness of breath, dizziness.   She is on cholesterol medication (crestor 20 mg daily ***)  and denies myalgias. Her cholesterol is not at goal. The cholesterol last visit was:   Lab Results  Component Value Date   CHOL 232 (H) 10/15/2017   HDL 39 (L) 10/15/2017   LDLCALC 142 (H) 10/15/2017   TRIG 329 (H) 10/15/2017   CHOLHDL 5.9 (H) 10/15/2017    She {Has/has not:18111} been working on diet and exercise for T2DM, and denies {Symptoms; diabetes w/o none:19199}. Last A1C in the office was:  Lab Results  Component Value Date   HGBA1C 9.8 (H) 10/15/2017   Vitamin D was very low at last check; patient reports she has *** started supplementation as recommended:  Lab Results  Component Value Date   VD25OH 9 (L) 10/15/2017        Current Medications:  Current Outpatient Medications on File Prior to Visit  Medication Sig  . aspirin EC 81 MG  tablet Take 81 mg by mouth daily.  . benzonatate (TESSALON) 100 MG capsule Take 1 capsule (100 mg total) by mouth 3 (three) times daily as needed for cough.  . budesonide-formoterol (SYMBICORT) 160-4.5 MCG/ACT inhaler Inhale 2 puffs into the lungs 2 (two) times daily.  . Cholecalciferol (VITAMIN D3) 2000 units TABS Take 4,000 Units by mouth.  .  citalopram (CELEXA) 20 MG tablet Take 1 tablet by mouth each day.  . clarithromycin (BIAXIN) 500 MG tablet Take 1 tablet (500 mg total) by mouth 2 (two) times daily.  . diclofenac sodium (VOLTAREN) 1 % GEL Apply 2 g topically 4 (four) times daily. (Patient taking differently: Apply 2 g topically as needed. )  . empagliflozin ([name]) 25 MG TABS tablet Take 25 mg by mouth daily.  Marland Kitchen glipiZIDE (GLUCOTROL) 5 MG tablet TAKE 1 TABLET WITH EACH MEAL.  Marland Kitchen HYDROcodone-homatropine (HYCODAN) 5-1.5 MG/5ML syrup Take 5 mLs by mouth every 8 (eight) hours as needed.  . meclizine (ANTIVERT) 25 MG tablet 1/2-1 pill up to 3 times daily for motion sickness/dizziness (Patient taking differently: as needed. 1/2-1 pill up to 3 times daily for motion sickness/dizziness)  . meloxicam (MOBIC) 15 MG tablet Take one daily with food for 2 weeks, can take with tylenol, can not take with aleve, iburpofen, then as needed daily for pain  . metFORMIN (GLUCOPHAGE XR) 500 MG 24 hr tablet Start by taking 1/2 tab daily with largest meal, gradually increase as tolerated up to max of 4 tabs daily divided between meals.  . montelukast (SINGULAIR) 10 MG tablet Take 1 tablet (10 mg total) by mouth at bedtime.  . ONE TOUCH ULTRA TEST test strip USE TO TEST BLOOD SUGAR 3 TIMES DAILY OR AS DIRECTED.  Marland Kitchen rosuvastatin (CRESTOR) 20 MG tablet TAKE ONE TABLET AT BEDTIME.   No current facility-administered medications on file prior to visit.      Allergies:  Allergies  Allergen Reactions  . Levaquin [Levofloxacin] Hives and Other (See Comments)    Tingling sensation to mouth, near LOC, blurred vision,    . Eggs Or Egg-Derived Products     Unknown reaction   . Iodine     Unknown reaction   . Levofloxacin In D5w Hives    Hives  . Metformin And Related Diarrhea  . Shellfish Allergy     Unknown reaction   . Vitamin D Analogs Diarrhea    High dose Vitamin D caused diarrhea  . Amoxicillin Rash  . Kombiglyze [Saxagliptin-Metformin Er]  Other (See Comments)    Bloating  . Latex Itching and Rash  . Penicillins Rash     Medical History:  Past Medical History:  Diagnosis Date  . Anxiety   . Diabetes mellitus without complication (HCC)   . Hx of colonic polyps    Fhx Colon cancer  . Hyperlipidemia   . Hypocholesteremia   . Tobacco abuse    Family history- Reviewed and unchanged Social history- Reviewed and unchanged   Review of Systems:  Review of Systems  Constitutional: Negative for malaise/fatigue and weight loss.  HENT: Negative for hearing loss and tinnitus.   Eyes: Negative for blurred vision and double vision.  Respiratory: Negative for cough, shortness of breath and wheezing.   Cardiovascular: Negative for chest pain, palpitations, orthopnea, claudication and leg swelling.  Gastrointestinal: Negative for abdominal pain, blood in stool, constipation, diarrhea, heartburn, melena, nausea and vomiting.  Genitourinary: Negative.   Musculoskeletal: Negative for joint pain and myalgias.  Skin: Negative for rash.  Neurological:  Negative for dizziness, tingling, sensory change, weakness and headaches.  Endo/Heme/Allergies: Negative for polydipsia.  Psychiatric/Behavioral: Negative.   All other systems reviewed and are negative.   Physical Exam: There were no vitals taken for this visit. Wt Readings from Last 3 Encounters:  12/18/17 174 lb (78.9 kg)  11/25/17 178 lb (80.7 kg)  10/15/17 178 lb (80.7 kg)   General Appearance: Well nourished, in no apparent distress. Eyes: PERRLA, EOMs, conjunctiva no swelling or erythema Sinuses: No Frontal/maxillary tenderness ENT/Mouth: Ext aud canals clear, TMs without erythema, bulging. No erythema, swelling, or exudate on post pharynx.  Tonsils not swollen or erythematous. Hearing normal.  Neck: Supple, thyroid normal.  Respiratory: Respiratory effort normal, BS equal bilaterally without rales, rhonchi, wheezing or stridor.  Cardio: RRR with no MRGs. Brisk peripheral  pulses without edema.  Abdomen: Soft, + BS.  Non tender, no guarding, rebound, hernias, masses. Lymphatics: Non tender without lymphadenopathy.  Musculoskeletal: Full ROM, 5/5 strength, {PSY - GAIT AND STATION:22860} gait Skin: Warm, dry without rashes, lesions, ecchymosis.  Neuro: Cranial nerves intact. No cerebellar symptoms.  Psych: Awake and oriented X 3, normal affect, Insight and Judgment appropriate.    Candice MakerAshley C Dimitry Holsworth, NP 8:58 AM [facility]

## 2018-01-21 ENCOUNTER — Ambulatory Visit: Payer: 59 | Admitting: Pulmonary Disease

## 2018-01-21 ENCOUNTER — Encounter: Payer: Self-pay | Admitting: Pulmonary Disease

## 2018-01-21 ENCOUNTER — Ambulatory Visit: Payer: Self-pay | Admitting: Adult Health

## 2018-01-21 VITALS — BP 120/90 | HR 83 | Ht 65.0 in | Wt 179.2 lb

## 2018-01-21 DIAGNOSIS — R05 Cough: Secondary | ICD-10-CM | POA: Diagnosis not present

## 2018-01-21 DIAGNOSIS — R059 Cough, unspecified: Secondary | ICD-10-CM

## 2018-01-21 LAB — NITRIC OXIDE: Nitric Oxide: 14

## 2018-01-21 MED ORDER — CHLORPHENIRAMINE MALEATE 4 MG PO TABS: 8.0000 mg | ORAL_TABLET | Freq: Three times a day (TID) | ORAL | 0 refills | Status: DC

## 2018-01-21 NOTE — Progress Notes (Signed)
Candice [name]    [id number]    1955/11/13  Primary Care Physician:McKeown, Chrissie Noa, MD  Referring Physician: Lucky Cowboy, MD [address] [place], [place] [address]  Chief complaint: Chronic cough  HPI: 62 year old with history of smoking, diabetes, hyperlipidemia, anxiety.  Has chronic cough for the past 1 month after an episode of likely upper respiratory tract infection. Seen by her primary care and given doxycycline which did not improve symptoms.  She is then given a shot of Rocephin and Biaxin. Reports that the breathing has improved. Chest x-ray did not show any acute abnormality.  She has slightly worsening symptoms of sinusitis, postnasal drip over the past few weeks which she attributes to allergy  Went to [employer] for a beach vacation the past month and was able to walk up to 8 miles a day without any problem She is given Symbicort by her primary care but is not using it as her breathing is doing well.  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  Quit in January 2019 Travel History: She is lived in [place] all her life.  No recent travel.  Outpatient Encounter Medications as of 01/21/2018  Medication Sig  . aspirin EC 81 MG tablet Take 81 mg by mouth daily.  . benzonatate (TESSALON) 100 MG capsule Take 1 capsule (100 mg total) by mouth 3 (three) times daily as needed for cough.  . budesonide-formoterol (SYMBICORT) 160-4.5 MCG/ACT inhaler Inhale 2 puffs into the lungs 2 (two) times daily.  . Cholecalciferol (VITAMIN D3) 2000 units TABS Take 4,000 Units by mouth.  . citalopram (CELEXA) 20 MG tablet Take 1 tablet by mouth each day.  . clarithromycin (BIAXIN) 500 MG tablet Take 1 tablet (500 mg total) by mouth 2 (two) times daily.  . diclofenac sodium (VOLTAREN) 1 % GEL Apply 2 g  topically 4 (four) times daily. (Patient taking differently: Apply 2 g topically as needed. )  . empagliflozin ([name]) 25 MG TABS tablet Take 25 mg by mouth daily.  Marland Kitchen glipiZIDE (GLUCOTROL) 5 MG tablet TAKE 1 TABLET WITH EACH MEAL.  Marland Kitchen HYDROcodone-homatropine (HYCODAN) 5-1.5 MG/5ML syrup Take 5 mLs by mouth every 8 (eight) hours as needed.  . meclizine (ANTIVERT) 25 MG tablet 1/2-1 pill up to 3 times daily for motion sickness/dizziness (Patient taking differently: as needed. 1/2-1 pill up to 3 times daily for motion sickness/dizziness)  . meloxicam (MOBIC) 15 MG tablet Take one daily with food for 2 weeks, can take with tylenol, can not take with aleve, iburpofen, then as needed daily for pain  . montelukast (SINGULAIR) 10 MG tablet Take 1 tablet (10 mg total) by mouth at bedtime.  . ONE TOUCH ULTRA TEST test strip USE TO TEST BLOOD SUGAR 3 TIMES DAILY OR AS DIRECTED.  Marland Kitchen rosuvastatin (CRESTOR) 20 MG tablet TAKE ONE TABLET AT BEDTIME.  . [DISCONTINUED] metFORMIN (GLUCOPHAGE XR) 500 MG 24 hr tablet Start by taking 1/2 tab daily with largest meal, gradually increase as tolerated up to max of 4 tabs daily divided between meals.   No facility-administered encounter medications on file as of 01/21/2018.     Allergies as of 01/21/2018 - Review Complete 01/21/2018  Allergen Reaction Noted  . Levaquin [levofloxacin] Hives and Other (See Comments) 12/02/2012  . Eggs or egg-derived products  10/20/2013  . Iodine  03/31/2013  .  Levofloxacin in d5w Hives 01/09/2014  . Metformin and related Diarrhea 10/20/2013  . Shellfish allergy  10/20/2013  . Vitamin d analogs Diarrhea 10/20/2013  . Amoxicillin Rash 10/20/2013  . Kombiglyze [saxagliptin-metformin er] Other (See Comments) 10/20/2013  . Latex Itching and Rash 12/02/2012  . Penicillins Rash 10/20/2013    Past Medical History:  Diagnosis Date  . Anxiety   . Diabetes mellitus without complication (HCC)   . Hx of colonic polyps    Fhx Colon cancer    . Hyperlipidemia   . Hypocholesteremia   . Tobacco abuse     Past Surgical History:  Procedure Laterality Date  . APPENDECTOMY    . CESAREAN SECTION    . CHOLECYSTECTOMY    . TONSILLECTOMY      Family History  Problem Relation Age of Onset  . AAA (abdominal aortic aneurysm) Mother   . Hyperlipidemia Mother   . Heart attack Brother 55  . Pneumonia Maternal Grandmother   . Cancer Sister        Colon  . Diabetes Sister   . Heart disease Sister     Social History   Socioeconomic History  . Marital status: Married    Spouse name: Not on file  . Number of children: Not on file  . Years of education: Not on file  . Highest education level: Not on file  Occupational History  . Not on file  Social Needs  . Financial resource strain: Not on file  . Food insecurity:    Worry: Not on file    Inability: Not on file  . Transportation needs:    Medical: Not on file    Non-medical: Not on file  Tobacco Use  . Smoking status: Former Smoker    Packs/day: 0.50    Years: 30.00    Pack years: 15.00    Types: Cigarettes    Last attempt to quit: 10/15/2017    Years since quitting: 0.2  . Smokeless tobacco: Never Used  Substance and Sexual Activity  . Alcohol use: No    Alcohol/week: 0.0 oz  . Drug use: No  . Sexual activity: Yes  Lifestyle  . Physical activity:    Days per week: Not on file    Minutes per session: Not on file  . Stress: Not on file  Relationships  . Social connections:    Talks on phone: Not on file    Gets together: Not on file    Attends religious service: Not on file    Active member of club or organization: Not on file    Attends meetings of clubs or organizations: Not on file    Relationship status: Not on file  . Intimate partner violence:    Fear of current or ex partner: Not on file    Emotionally abused: Not on file    Physically abused: Not on file    Forced sexual activity: Not on file  Other Topics Concern  . Not on file  Social  History Narrative  . Not on file    Review of systems: Review of Systems  Constitutional: Negative for fever and chills.  HENT: Negative.   Eyes: Negative for blurred vision.  Respiratory: as per HPI  Cardiovascular: Negative for chest pain and palpitations.  Gastrointestinal: Negative for vomiting, diarrhea, blood per rectum. Genitourinary: Negative for dysuria, urgency, frequency and hematuria.  Musculoskeletal: Negative for myalgias, back pain and joint pain.  Skin: Negative for itching and rash.  Neurological: Negative for dizziness,  tremors, focal weakness, seizures and loss of consciousness.  Endo/Heme/Allergies: Negative for environmental allergies.  Psychiatric/Behavioral: Negative for depression, suicidal ideas and hallucinations.  All other systems reviewed and are negative.  Physical Exam: Blood pressure 120/90, pulse 83, height 5\' 5"  (1.651 m), weight 179 lb 3.2 oz (81.3 kg), SpO2 96 %. Gen:      No acute distress HEENT:  EOMI, sclera anicteric Neck:     No masses; no thyromegaly Lungs:    Clear to auscultation bilaterally; normal respiratory effort CV:         Regular rate and rhythm; no murmurs Abd:      + bowel sounds; soft, non-tender; no palpable masses, no distension Ext:    No edema; adequate peripheral perfusion Skin:      Warm and dry; no rash Neuro: alert and oriented x 3 Psych: normal mood and affect  Data Reviewed: FENO 01/21/18-14  Chest x-ray 12/18/17-no acute cardiopulmonary abnormality CT chest 04/06/15-lungs are clear, no lymphadenopathy I reviewed the images personally.  Assessment:  Evaluation for cough Likely upper airway cough from allergies, postnasal drip.  Denies GERD symptoms Recommended chlorpheniramine antihistamine 8 mg 3 times daily.  She will continue on steroid nasal spray  Evaluate for COPD given smoking history.  She is asymptomatic and does not need any inhalers Schedule PFTs.  Plan/Recommendations: - Chlorphentermine, steroid  nasal spray - PFTs  Chilton GreathousePraveen Allisyn Kunz MD [facility] 01/21/2018, 3:25 PM  CC: Lucky CowboyMcKeown, William, MD

## 2018-01-21 NOTE — Patient Instructions (Signed)
Start chlorpheniramine 8 mg 3 times daily, continue steroid nasal spray We will schedule you for pulmonary function test Follow-up in 1-2 months.

## 2018-02-12 DIAGNOSIS — H10413 Chronic giant papillary conjunctivitis, bilateral: Secondary | ICD-10-CM | POA: Diagnosis not present

## 2018-02-12 DIAGNOSIS — E119 Type 2 diabetes mellitus without complications: Secondary | ICD-10-CM | POA: Diagnosis not present

## 2018-02-12 DIAGNOSIS — H2513 Age-related nuclear cataract, bilateral: Secondary | ICD-10-CM | POA: Diagnosis not present

## 2018-02-12 LAB — HM DIABETES EYE EXAM

## 2018-02-19 ENCOUNTER — Ambulatory Visit: Payer: Self-pay | Admitting: Adult Health

## 2018-02-23 ENCOUNTER — Encounter: Payer: Self-pay | Admitting: *Deleted

## 2018-02-28 ENCOUNTER — Other Ambulatory Visit: Payer: Self-pay | Admitting: Internal Medicine

## 2018-03-01 ENCOUNTER — Other Ambulatory Visit: Payer: Self-pay | Admitting: Adult Health

## 2018-03-01 ENCOUNTER — Other Ambulatory Visit: Payer: Self-pay | Admitting: Internal Medicine

## 2018-03-12 ENCOUNTER — Encounter: Payer: Self-pay | Admitting: Internal Medicine

## 2018-03-12 ENCOUNTER — Ambulatory Visit: Payer: 59 | Admitting: Internal Medicine

## 2018-03-12 VITALS — BP 110/60 | HR 86 | Ht 65.0 in | Wt 181.0 lb

## 2018-03-12 DIAGNOSIS — B3731 Acute candidiasis of vulva and vagina: Secondary | ICD-10-CM

## 2018-03-12 DIAGNOSIS — B373 Candidiasis of vulva and vagina: Secondary | ICD-10-CM | POA: Diagnosis not present

## 2018-03-12 DIAGNOSIS — E1165 Type 2 diabetes mellitus with hyperglycemia: Secondary | ICD-10-CM

## 2018-03-12 LAB — POCT GLYCOSYLATED HEMOGLOBIN (HGB A1C): HEMOGLOBIN A1C: 9.5 % — AB (ref 4.0–5.6)

## 2018-03-12 MED ORDER — METFORMIN HCL ER 500 MG PO TB24: 500.0000 mg | ORAL_TABLET | Freq: Three times a day (TID) | ORAL | 3 refills | Status: DC

## 2018-03-12 MED ORDER — FLUCONAZOLE 150 MG PO TABS: 150.0000 mg | ORAL_TABLET | Freq: Once | ORAL | 1 refills | Status: AC

## 2018-03-12 MED ORDER — GLUCOSE BLOOD VI STRP: ORAL_STRIP | 3 refills | Status: DC

## 2018-03-12 MED ORDER — DULAGLUTIDE 0.75 MG/0.5ML ~~LOC~~ SOAJ: SUBCUTANEOUS | 1 refills | Status: DC

## 2018-03-12 MED ORDER — ONETOUCH DELICA LANCETS 33G MISC: 3 refills | Status: DC

## 2018-03-12 MED ORDER — TERCONAZOLE 0.4 % VA CREA: 1.0000 | TOPICAL_CREAM | Freq: Every day | VAGINAL | 2 refills | Status: DC

## 2018-03-12 NOTE — Patient Instructions (Addendum)
Please continue: - Metformin ER 500 mg 3x a day, with meals - [name] 25 mg before breakfast  Please start [name] 0.75 mg weekly. Let me know when you are close to running out to call in the higher dose to your pharmacy (1.5 mg).  Please start Terconazole Cream 0.4%: - insert 1 applicatorful vaginally at bedtime every night for 1 week, then - insert 1/2 applicatorful at bedtime once weekly indefinitely to maintain effect  Please return in 3 months with your sugar log.   PATIENT INSTRUCTIONS FOR TYPE 2 DIABETES:  **Please join MyChart!** - see attached instructions about how to join if you have not done so already.  DIET AND EXERCISE Diet and exercise is an important part of diabetic treatment.  We recommended aerobic exercise in the form of brisk walking (working between 40-60% of maximal aerobic capacity, similar to brisk walking) for 150 minutes per week (such as 30 minutes five days per week) along with 3 times per week performing 'resistance' training (using various gauge rubber tubes with handles) 5-10 exercises involving the major muscle groups (upper body, lower body and core) performing 10-15 repetitions (or near fatigue) each exercise. Start at half the above goal but build slowly to reach the above goals. If limited by weight, joint pain, or disability, we recommend daily walking in a swimming pool with water up to waist to reduce pressure from joints while allow for adequate exercise.    BLOOD GLUCOSES Monitoring your blood glucoses is important for continued management of your diabetes. Please check your blood glucoses 2-4 times a day: fasting, before meals and at bedtime (you can rotate these measurements - e.g. one day check before the 3 meals, the next day check before 2 of the meals and before bedtime, etc.).   HYPOGLYCEMIA (low blood sugar) Hypoglycemia is usually a reaction to not eating, exercising, or taking too much insulin/ other diabetes drugs.  Symptoms include  tremors, sweating, hunger, confusion, headache, etc. Treat IMMEDIATELY with 15 grams of Carbs: . 4 glucose tablets .  cup regular juice/soda . 2 tablespoons raisins . 4 teaspoons sugar . 1 tablespoon honey Recheck blood glucose in 15 mins and repeat above if still symptomatic/blood glucose <100.  RECOMMENDATIONS TO REDUCE YOUR RISK OF DIABETIC COMPLICATIONS: * Take your prescribed MEDICATION(S) * Follow a DIABETIC diet: Complex carbs, fiber rich foods, (monounsaturated and polyunsaturated) fats * AVOID saturated/trans fats, high fat foods, >2,300 mg salt per day. * EXERCISE at least 5 times a week for 30 minutes or preferably daily.  * DO NOT SMOKE OR DRINK more than 1 drink a day. * Check your FEET every day. Do not wear tightfitting shoes. Contact us if you develop an ulcer * See your EYE doctor once a year or more if needed * Get a FLU shot once a year * Get a PNEUMONIA vaccine once before and once after age 60 years  GOALS:  * Your Hemoglobin A1c of <7%  * fasting sugars need to be <130 * after meals sugars need to be <180 (2h after you start eating) * Your Systolic BP should be 140 or lower  * Your Diastolic BP should be 80 or lower  * Your HDL (Good Cholesterol) should be 40 or higher  * Your LDL (Bad Cholesterol) should be 100 or lower. * Your Triglycerides should be 150 or lower  * Your Urine microalbumin (kidney function) should be <30 * Your Body Mass Index should be 25 or lower    Please  consider the following ways to cut down carbs and fat and increase fiber and micronutrients in your diet: - substitute whole grain for white bread or pasta - substitute brown rice for white rice - substitute 90-calorie flat bread pieces for slices of bread when possible - substitute sweet potatoes or yams for white potatoes - substitute humus for margarine - substitute tofu for cheese when possible - substitute almond or rice milk for regular milk (would not drink soy milk daily  due to concern for soy estrogen influence on breast cancer risk) - substitute dark chocolate for other sweets when possible - substitute water - can add lemon or orange slices for taste - for diet sodas (artificial sweeteners will trick your body that you can eat sweets without getting calories and will lead you to overeating and weight gain in the long run) - do not skip breakfast or other meals (this will slow down the metabolism and will result in more weight gain over time)  - can try smoothies made from fruit and almond/rice milk in am instead of regular breakfast - can also try old-fashioned (not instant) oatmeal made with almond/rice milk in am - order the dressing on the side when eating salad at a restaurant (pour less than half of the dressing on the salad) - eat as little meat as possible - can try juicing, but should not forget that juicing will get rid of the fiber, so would alternate with eating raw veg./fruits or drinking smoothies - use as little oil as possible, even when using olive oil - can dress a salad with a mix of balsamic vinegar and lemon juice, for e.g. - use agave nectar, stevia sugar, or regular sugar rather than artificial sweateners - steam or broil/roast veggies  - snack on veggies/fruit/nuts (unsalted, preferably) when possible, rather than processed foods - reduce or eliminate aspartame in diet (it is in diet sodas, chewing gum, etc) Read the labels!  Try to read Dr. Katherina Right book: "Program for Reversing Diabetes" for other ideas for healthy eating.

## 2018-03-12 NOTE — Progress Notes (Signed)
Patient ID: Candice [name], female   DOB: 1956-01-23, 62 y.o.   MRN: [id number]   HPI: Candice [name] is a 62 y.o.-year-old female, self-referred, for management of DM2, dx in 2012-2013, non-insulin-dependent, uncontrolled, without long term complications. PCP: Judd Gaudier PA  Last hemoglobin A1c was: Lab Results  Component Value Date   HGBA1C 9.8 (H) 10/15/2017   HGBA1C 8.8 (H) 04/08/2017   HGBA1C 9.0 (H) 11/04/2016   Pt is on a regimen of: - Metformin ER 500 mg 3x a day, with meals - [name] 25 mg daily in am >> yeast infection Used Farxiga in the past >> not covered anymore. No yeast inf's. She was on Glipizide 5 mg 2x a day before meals She was on Victoza >> tolerated it well.  Pt is not checking sugars. She checked last week at work: - am: n/c - 2h after b'fast: 220 - before lunch: n/c - 2h after lunch: n/c - before dinner: n/c - 2h after dinner: n/c - bedtime: n/c - nighttime: n/c Lowest sugar was 110; she has hypoglycemia awareness - but unclear at which level ?Marland Kitchen  Highest sugar was 285.  Glucometer: none  Pt's meals are: - Breakfast: PB crackers, fruit, sausage McMuffin at 10 am - Lunch: chicken salad wraps, sandwiches, baked chips - Dinner: veggies, meats, salad - Snacks: crackers  - no CKD, last BUN/creatinine:  Lab Results  Component Value Date   BUN 14 10/15/2017   BUN 15 04/08/2017   CREATININE 0.79 10/15/2017   CREATININE 0.74 04/08/2017   - + HL; last set of lipids: Lab Results  Component Value Date   CHOL 232 (H) 10/15/2017   HDL 39 (L) 10/15/2017   LDLCALC 142 (H) 10/15/2017   TRIG 329 (H) 10/15/2017   CHOLHDL 5.9 (H) 10/15/2017  On Crestor 20.  - last eye exam was in 02/2018 3x a week in 10/2017, now daily. No DR. + cataract.  - no numbness and tingling in her feet.  On ASA 81.  Pt has FH of DM in sister.  ROS: Constitutional: + Weight gain, no fatigue, + hot flashes Eyes: no blurry vision, no xerophthalmia ENT: no sore  throat, no nodules palpated in throat, no dysphagia/odynophagia, no hoarseness Cardiovascular: no CP/SOB/palpitations/leg swelling Respiratory: + Cough/no SOB, + wheezing Gastrointestinal: no N/V/D/C Musculoskeletal: no muscle/joint aches Skin: + Rash Neurological: no tremors/numbness/tingling/dizziness Psychiatric: no depression/anxiety  + low libido   Past Medical History:  Diagnosis Date  . Anxiety   . Diabetes mellitus without complication (HCC)   . Hx of colonic polyps    Fhx Colon cancer  . Hyperlipidemia   . Hypocholesteremia   . Tobacco abuse    Past Surgical History:  Procedure Laterality Date  . APPENDECTOMY    . CESAREAN SECTION    . CHOLECYSTECTOMY    . TONSILLECTOMY     Social History   Socioeconomic History  . Marital status: Married    Spouse name: Not on file  . Number of children: 1  . Years of education: Not on file  . Highest education level: Not on file  Occupational History  .  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 Financial resource strain: Not on file  . Food insecurity:    Worry: Not on file    Inability: Not on file  . Transportation needs:    Medical: Not on file    Non-medical: Not on file  Tobacco Use  . Smoking status: Former Smoker  Packs/day: 0.50    Years: 30.00    Pack years: 15.00    Types: Cigarettes    Last attempt to quit: 10/15/2017    Years since quitting: 0.4  . Smokeless tobacco: Never Used  Substance and Sexual Activity  . Alcohol use: No    Alcohol/week: 0.0 oz  . Drug use: No   Current Outpatient Medications on File Prior to Visit  Medication Sig Dispense Refill  . aspirin EC 81 MG tablet Take 81 mg by mouth daily.    . benzonatate (TESSALON) 100 MG capsule Take 1 capsule (100 mg total) by mouth 3 (three) times daily as needed for cough. 30 capsule 2  . budesonide-formoterol (SYMBICORT) 160-4.5 MCG/ACT inhaler Inhale 2 puffs into the lungs 2 (two) times daily. 1 Inhaler 3  . chlorpheniramine  (CHLOR-TRIMETON) 4 MG tablet Take 2 tablets (8 mg total) by mouth 3 (three) times daily. 190 tablet 0  . Cholecalciferol (VITAMIN D3) 2000 units TABS Take 4,000 Units by mouth.    . citalopram (CELEXA) 20 MG tablet TAKE 1 TABLET EACH DAY. 30 tablet 0  . clarithromycin (BIAXIN) 500 MG tablet Take 1 tablet (500 mg total) by mouth 2 (two) times daily. 20 tablet 0  . diclofenac sodium (VOLTAREN) 1 % GEL Apply 2 g topically 4 (four) times daily. (Patient taking differently: Apply 2 g topically as needed. ) 100 g 3  . empagliflozin ([name]) 25 MG TABS tablet Take 25 mg by mouth daily. 90 tablet 3  . glipiZIDE (GLUCOTROL) 5 MG tablet TAKE 1 TABLET WITH EACH MEAL. 180 tablet 1  . HYDROcodone-homatropine (HYCODAN) 5-1.5 MG/5ML syrup Take 5 mLs by mouth every 8 (eight) hours as needed. 120 mL 0  . meclizine (ANTIVERT) 25 MG tablet 1/2-1 pill up to 3 times daily for motion sickness/dizziness (Patient taking differently: as needed. 1/2-1 pill up to 3 times daily for motion sickness/dizziness) 30 tablet 0  . meloxicam (MOBIC) 15 MG tablet Take one daily with food for 2 weeks, can take with tylenol, can not take with aleve, iburpofen, then as needed daily for pain 30 tablet 1  . metFORMIN (GLUMETZA) 500 MG (MOD) 24 hr tablet Take 500 mg by mouth daily with breakfast.    . montelukast (SINGULAIR) 10 MG tablet Take 1 tablet (10 mg total) by mouth at bedtime. 90 tablet 3  . ONE TOUCH ULTRA TEST test strip USE TO TEST BLOOD SUGAR 3 TIMES DAILY OR AS DIRECTED. 25 each 0  . rosuvastatin (CRESTOR) 20 MG tablet TAKE ONE TABLET AT BEDTIME. 30 tablet 0   No current facility-administered medications on file prior to visit.    Allergies  Allergen Reactions  . Levaquin [Levofloxacin] Hives and Other (See Comments)    Tingling sensation to mouth, near LOC, blurred vision,    . Eggs Or Egg-Derived Products     Unknown reaction   . Iodine     Unknown reaction   . Levofloxacin In D5w Hives    Hives  . Metformin And  Related Diarrhea  . Shellfish Allergy     Unknown reaction   . Vitamin D Analogs Diarrhea    High dose Vitamin D caused diarrhea  . Amoxicillin Rash  . Kombiglyze [Saxagliptin-Metformin Er] Other (See Comments)    Bloating  . Latex Itching and Rash  . Penicillins Rash   Family History  Problem Relation Age of Onset  . AAA (abdominal aortic aneurysm) Mother   . Hyperlipidemia Mother   . Heart attack Brother  55  . Pneumonia Maternal Grandmother   . Cancer Sister        Colon  . Diabetes Sister   . Heart disease Sister     PE: BP 110/60   Pulse 86   Ht  (1.651 m)   Wt 181 lb (82.1 kg)   SpO2 97%   BMI 30.12 kg/m  Wt Readings from Last 3 Encounters:  03/12/18 181 lb (82.1 kg)  01/21/18 179 lb 3.2 oz (81.3 kg)  12/18/17 174 lb (78.9 kg)   Constitutional: overweight, in NAD Eyes: PERRLA, EOMI, no exophthalmos ENT: moist mucous membranes, no thyromegaly, no cervical lymphadenopathy Cardiovascular: RRR, No MRG Respiratory: CTA B Gastrointestinal: abdomen soft, NT, ND, BS+ Musculoskeletal: no deformities, strength intact in all 4 Skin: moist, warm, no rashes Neurological: no tremor with outstretched hands, DTR normal in all 4  ASSESSMENT: 1. DM2, non-insulin-dependent, uncontrolled, without long term complications, but with hyperglycemia  2. Yeast vaginitis  PLAN:  1. Patient with long-standing, uncontrolled diabetes, on oral antidiabetic regimen, which became insufficient. HbA1c today is 9.4% (slightly better, but still very high). - She is not checking sugars at home, and I strongly advised her to start doing so - At this visit, we discussed about getting to a target HbA1c of less than 7% to avoid complications.  I suggested to add a GLP-1 receptor agonist.  She does not have a family history of medullary thyroid cancer or personal history of pancreatitis.  She was on Victoza in the past and tolerated it well.  We will try to start [name] at low dose and advance  at as tolerated.  Given a discount card for [name].  Advised her how to use it. - We will continue [name] and metformin for now - I suggested to:  Patient Instructions  Please continue: - Metformin ER 500 mg 3x a day with meals - [name] 25 mg before breakfast  Please start [name] 0.75 mg weekly. Let me know when you are close to running out to call in the higher dose to your pharmacy (1.5 mg).  Please start Terconazole Cream 0.4%: - insert 1 applicatorful vaginally at bedtime every night for 1 week, then - insert 1/2 applicatorful at bedtime once weekly indefinitely to maintain effect  Please return in 3 months with your sugar log.   - Strongly advised her to start checking sugars at different times of the day - check 2 times a day, rotating checks. We gave her a One Touch Verio meter. - given sugar log and advised how to fill it and to bring it at next appt.   - given foot care handout and explained the principles  - given instructions for hypoglycemia management "15-15 rule"  - advised for yearly eye exams  - Return to clinic in 3 mo with sugar log   2.  Yeast vaginitis -We will call in Diflucan, and also advised her to start terconazole cream for maintenance treatment as described above -If this persists she may need to come off [name].  Carlus Pavlov, MD PhD [facility] Endocr[facility]

## 2018-03-12 NOTE — Addendum Note (Signed)
Addended by: Yolande Jolly on: 03/12/2018 03:56 PM   Modules accepted: Orders

## 2018-04-09 ENCOUNTER — Ambulatory Visit: Payer: 59 | Admitting: Pulmonary Disease

## 2018-04-30 ENCOUNTER — Other Ambulatory Visit: Payer: Self-pay | Admitting: Adult Health

## 2018-05-12 ENCOUNTER — Other Ambulatory Visit: Payer: Self-pay | Admitting: Internal Medicine

## 2018-05-12 ENCOUNTER — Other Ambulatory Visit: Payer: Self-pay | Admitting: Adult Health

## 2018-05-12 MED ORDER — DULAGLUTIDE 1.5 MG/0.5ML ~~LOC~~ SOAJ: 1.5000 mg | SUBCUTANEOUS | 5 refills | Status: DC

## 2018-05-12 NOTE — Telephone Encounter (Signed)
Yes, please send the 1.5 mg #4 pens with 5 refills

## 2018-05-12 NOTE — Telephone Encounter (Signed)
Should I send in the higher RX, per chart note we would up dose when refill was needed. Please advise

## 2018-05-18 NOTE — Progress Notes (Signed)
FOLLOW UP  Assessment and Plan:   Cholesterol Currently above goal; increase rosuvastatin to 40 mg daily Continue low cholesterol diet and exercise.  Check lipid panel.   Diabetes without complications Now managed by Dr. Elvera Lennox Continue medication: jardiance, trulicity, metformin 500 mg x 3 Continue diet and exercise.  Perform daily foot/skin check, notify office of any concerning changes.  Defer A1C to Dr. Charlean Sanfilippo office  Obesity with co morbidities Long discussion about weight loss, diet, and exercise Recommended diet heavy in fruits and veggies and low in animal meats, cheeses, and dairy products, appropriate calorie intake Discussed ideal weight for height Patient will work on resuming previous diet, start exercising Will follow up in 3 months  Vitamin D Def Very low at last visit; has started supplementation  Check Vit D level, continue to counsel   Anxiety Sees benefit with celexa; will try 40 mg dose - sent in  Stress management techniques discussed, increase water, good sleep hygiene discussed, increase exercise, and increase veggies.   Former smoker Doing well with cessation since Jan 2019; seeing pulmonology and getting PFTs  Continue diet and meds as discussed. Further disposition pending results of labs. Discussed med's effects and SE's.   Over 30 minutes of exam, counseling, chart review, and critical decision making was performed.   Future Appointments  Date Time Provider Department Center  05/21/2018  2:00 PM [facility] None  05/21/2018  3:00 PM Mannam, Colbert Coyer, MD [facility] None  07/09/2018  3:15 PM Carlus Pavlov, MD [facility] None   ----------------------------------------------------------------------------------------------------------------------  HPI 62 y.o. female  presents for 3 month follow up on blood pressure, cholesterol, T2 diabetes, obesity and vitamin D deficiency.   She reports she has had increased stress related  to her daughter going through a divorce and moving in. She is on celexa 20 mg and feels this dose is beneficial, but is interested in trying higher dose.   BMI is Body mass index is 29.79 kg/m., she has not been working on diet and exercise.She has joined a new gym which opened this week, plans to start going next week with her daughter. She also plans to improve her diet, has been going to salad restaurant for lunch.  Wt Readings from Last 3 Encounters:  05/19/18 179 lb (81.2 kg)  03/12/18 181 lb (82.1 kg)  01/21/18 179 lb 3.2 oz (81.3 kg)   Today their BP is BP: 122/60  She does not workout. She denies chest pain, shortness of breath, dizziness.   She is on cholesterol medication (rosuvastatin 20 mg daily ) and denies myalgias. Her cholesterol is not at goal. The cholesterol last visit was:   Lab Results  Component Value Date   CHOL 232 (H) 10/15/2017   HDL 39 (L) 10/15/2017   LDLCALC 142 (H) 10/15/2017   TRIG 329 (H) 10/15/2017   CHOLHDL 5.9 (H) 10/15/2017    She has not been working on diet and exercise for T2 diabetes (metformin 500 mg TID, jardiance, trulicity), now following with Dr. Elvera Lennox as well, and denies hypoglycemia , increased appetite, nausea, paresthesia of the feet, polydipsia, polyuria, visual disturbances and vomiting. She does check sugars regularly, reports fasting ranging 99-160. Does not check postprandial. Last A1C in the office was:  Lab Results  Component Value Date   HGBA1C 9.5 (A) 03/12/2018   Patient is newly back on Vitamin D supplement, taking 4000-6000 daily, was very low at the last check:    Lab Results  Component Value Date   VD25OH  9 (L) 10/15/2017        Current Medications:  Current Outpatient Medications on File Prior to Visit  Medication Sig  . aspirin EC 81 MG tablet Take 81 mg by mouth daily.  . chlorpheniramine (CHLOR-TRIMETON) 4 MG tablet Take 2 tablets (8 mg total) by mouth 3 (three) times daily.  . Cholecalciferol (VITAMIN D3)  2000 units TABS Take 4,000 Units by mouth.  . Dulaglutide ([name]) 1.5 MG/0.5ML SOPN Inject 1.5 mg into the skin once a week.  . empagliflozin ([name]) 25 MG TABS tablet Take 25 mg by mouth daily.  Marland Kitchen. glucose blood (ONETOUCH VERIO) test strip Use 2x a day  . metFORMIN (GLUCOPHAGE-XR) 500 MG 24 hr tablet Take 1 tablet (500 mg total) by mouth 3 (three) times daily with meals.  . montelukast (SINGULAIR) 10 MG tablet Take 1 tablet (10 mg total) by mouth at bedtime.  Letta Pate. ONETOUCH DELICA LANCETS 33G MISC Check 2x a day  . rosuvastatin (CRESTOR) 20 MG tablet TAKE ONE TABLET AT BEDTIME.  Marland Kitchen. terconazole (TERAZOL 7) 0.4 % vaginal cream Place 1 applicator vaginally at bedtime.  . diclofenac sodium (VOLTAREN) 1 % GEL Apply 2 g topically 4 (four) times daily. (Patient taking differently: Apply 2 g topically as needed. )   No current facility-administered medications on file prior to visit.      Allergies:  Allergies  Allergen Reactions  . Levaquin [Levofloxacin] Hives and Other (See Comments)    Tingling sensation to mouth, near LOC, blurred vision,    . Eggs Or Egg-Derived Products     Unknown reaction   . Iodine     Unknown reaction   . Levofloxacin In D5w Hives    Hives  . Metformin And Related Diarrhea  . Shellfish Allergy     Unknown reaction   . Vitamin D Analogs Diarrhea    High dose Vitamin D caused diarrhea  . Amoxicillin Rash  . Kombiglyze [Saxagliptin-Metformin Er] Other (See Comments)    Bloating  . Latex Itching and Rash  . Penicillins Rash     Medical History:  Past Medical History:  Diagnosis Date  . Anxiety   . Diabetes mellitus without complication (HCC)   . Hx of colonic polyps    Fhx Colon cancer  . Hyperlipidemia   . Hypocholesteremia   . Tobacco abuse    Family history- Reviewed and unchanged Social history- Reviewed and unchanged   Review of Systems:  Review of Systems  Constitutional: Negative for malaise/fatigue and weight loss.  HENT: Negative  for hearing loss and tinnitus.   Eyes: Negative for blurred vision and double vision.  Respiratory: Negative for cough, shortness of breath and wheezing.   Cardiovascular: Negative for chest pain, palpitations, orthopnea, claudication and leg swelling.  Gastrointestinal: Negative for abdominal pain, blood in stool, constipation, diarrhea, heartburn, melena, nausea and vomiting.  Genitourinary: Negative.   Musculoskeletal: Negative for joint pain and myalgias.  Skin: Negative for rash.  Neurological: Negative for dizziness, tingling, sensory change, weakness and headaches.  Endo/Heme/Allergies: Negative for polydipsia.  Psychiatric/Behavioral: Negative for depression and substance abuse. The patient is nervous/anxious. The patient does not have insomnia.   All other systems reviewed and are negative.   Physical Exam: BP 122/60   Pulse 87   Temp (!) 97.3 F (36.3 C)   Ht 5\' 5"  (1.651 m)   Wt 179 lb (81.2 kg)   SpO2 96%   BMI 29.79 kg/m  Wt Readings from Last 3 Encounters:  05/19/18 179 lb (  81.2 kg)  03/12/18 181 lb (82.1 kg)  01/21/18 179 lb 3.2 oz (81.3 kg)   General Appearance: Well nourished, in no apparent distress. Eyes: PERRLA, EOMs, conjunctiva no swelling or erythema Sinuses: No Frontal/maxillary tenderness ENT/Mouth: Ext aud canals clear, TMs without erythema, bulging. No erythema, swelling, or exudate on post pharynx.  Tonsils not swollen or erythematous. Hearing normal.  Neck: Supple, thyroid normal.  Respiratory: Respiratory effort normal, BS equal bilaterally without rales, rhonchi, wheezing or stridor.  Cardio: RRR with no MRGs. Brisk peripheral pulses without edema.  Abdomen: Soft, + BS.  Non tender, no guarding, rebound, hernias, masses. Lymphatics: Non tender without lymphadenopathy.  Musculoskeletal: Full ROM, 5/5 strength, Normal gait Skin: Warm, dry without rashes, lesions, ecchymosis.  Neuro: Cranial nerves intact. No cerebellar symptoms.  Psych: Awake and  oriented X 3, normal affect, Insight and Judgment appropriate.    Dan Maker, NP 11:59 AM [facility]

## 2018-05-19 ENCOUNTER — Encounter: Payer: Self-pay | Admitting: Adult Health

## 2018-05-19 ENCOUNTER — Ambulatory Visit: Payer: 59 | Admitting: Adult Health

## 2018-05-19 ENCOUNTER — Encounter: Payer: Self-pay | Admitting: Internal Medicine

## 2018-05-19 VITALS — BP 122/60 | HR 87 | Temp 97.3°F | Ht 65.0 in | Wt 179.0 lb

## 2018-05-19 DIAGNOSIS — Z79899 Other long term (current) drug therapy: Secondary | ICD-10-CM

## 2018-05-19 DIAGNOSIS — E669 Obesity, unspecified: Secondary | ICD-10-CM | POA: Diagnosis not present

## 2018-05-19 DIAGNOSIS — Z87891 Personal history of nicotine dependence: Secondary | ICD-10-CM | POA: Diagnosis not present

## 2018-05-19 DIAGNOSIS — E782 Mixed hyperlipidemia: Secondary | ICD-10-CM

## 2018-05-19 DIAGNOSIS — E1169 Type 2 diabetes mellitus with other specified complication: Secondary | ICD-10-CM

## 2018-05-19 DIAGNOSIS — F411 Generalized anxiety disorder: Secondary | ICD-10-CM

## 2018-05-19 DIAGNOSIS — Z1211 Encounter for screening for malignant neoplasm of colon: Secondary | ICD-10-CM

## 2018-05-19 DIAGNOSIS — E119 Type 2 diabetes mellitus without complications: Secondary | ICD-10-CM

## 2018-05-19 DIAGNOSIS — Z800 Family history of malignant neoplasm of digestive organs: Secondary | ICD-10-CM

## 2018-05-19 DIAGNOSIS — E559 Vitamin D deficiency, unspecified: Secondary | ICD-10-CM

## 2018-05-19 MED ORDER — MECLIZINE HCL 25 MG PO TABS: ORAL_TABLET | ORAL | 0 refills | Status: DC

## 2018-05-19 MED ORDER — MELOXICAM 15 MG PO TABS: ORAL_TABLET | ORAL | 1 refills | Status: DC

## 2018-05-19 MED ORDER — ROSUVASTATIN CALCIUM 40 MG PO TABS: ORAL_TABLET | ORAL | 1 refills | Status: DC

## 2018-05-19 MED ORDER — CITALOPRAM HYDROBROMIDE 40 MG PO TABS: ORAL_TABLET | ORAL | 1 refills | Status: DC

## 2018-05-19 NOTE — Patient Instructions (Addendum)
Increase celexa to 40 mg   Try increasing rosuvastatin 40 mg daily if cholesterol elevated today  Know what a healthy weight is for you (roughly BMI <25) and aim to maintain this  Aim for 7+ servings of fruits and vegetables daily  65-80+ fluid ounces of water or unsweet tea for healthy kidneys  Limit to max 1 drink of alcohol per day; avoid smoking/tobacco  Limit animal fats in diet for cholesterol and heart health - choose grass fed whenever available  Avoid highly processed foods, and foods high in saturated/trans fats  Aim for low stress - take time to unwind and care for your mental health  Aim for 150 min of moderate intensity exercise weekly for heart health, and weights twice weekly for bone health  Aim for 7-9 hours of sleep daily    Drink 1/2 your body weight in fluid ounces of water daily; drink a tall glass of water 30 min before meals  Don't eat until you're stuffed- listen to your stomach and eat until you are 80% full   Try eating off of a salad plate; wait 10 min after finishing before going back for seconds  Start by eating the vegetables on your plate; aim for 14%50% of your meals to be fruits or vegetables  Then eat your protein - lean meats (grass fed if possible), fish, beans, nuts in moderation  Eat your carbs/starch last ONLY if you still are hungry. If you can, stop before finishing it all  Avoid sugar and flour - the closer it looks to it's original form in nature, typically the better it is for you  Splurge in moderation - "assign" days when you get to splurge and have the "bad stuff" - I like to follow a 80% - 20% plan- "good" choices 80 % of the time, "bad" choices in moderation 20% of the time  Simple equation is: Calories out > calories in = weight loss - even if you eat the bad stuff, if you limit portions, you will still lose weight       When it comes to diets, agreement about the perfect plan isn't easy to find, even among the  experts. Experts at the [employer] developed an idea known as the Healthy Eating Plate. Just imagine a plate divided into logical, healthy portions.  The emphasis is on diet quality:  Load up on vegetables and fruits - one-half of your plate: Aim for color and variety, and remember that potatoes don't count.  Go for whole grains - one-quarter of your plate: Whole wheat, barley, wheat berries, quinoa, oats, brown rice, and foods made with them. If you want pasta, go with whole wheat pasta.  Protein power - one-quarter of your plate: Fish, chicken, beans, and nuts are all healthy, versatile protein sources. Limit red meat.  The diet, however, does go beyond the plate, offering a few other suggestions.  Use healthy plant oils, such as olive, canola, soy, corn, sunflower and peanut. Check the labels, and avoid partially hydrogenated oil, which have unhealthy trans fats.  If you're thirsty, drink water. Coffee and tea are good in moderation, but skip sugary drinks and limit milk and dairy products to one or two daily servings.  The type of carbohydrate in the diet is more important than the amount. Some sources of carbohydrates, such as vegetables, fruits, whole grains, and beans-are healthier than others.  Finally, stay active.

## 2018-05-20 LAB — LIPID PANEL
CHOL/HDL RATIO: 4.3 (calc) (ref <5.0)
Cholesterol: 173 mg/dL (ref <200)
HDL: 40 mg/dL — ABNORMAL LOW (ref >50)
LDL CHOLESTEROL (CALC): 96 mg/dL
NON-HDL CHOLESTEROL (CALC): 133 mg/dL — AB (ref <130)
TRIGLYCERIDES: 260 mg/dL — AB (ref <150)

## 2018-05-20 LAB — CBC WITH DIFFERENTIAL/PLATELET
Basophils Absolute: 50 cells/uL (ref 0–200)
Basophils Relative: 0.8 %
EOS ABS: 93 {cells}/uL (ref 15–500)
Eosinophils Relative: 1.5 %
HCT: 45.3 % — ABNORMAL HIGH (ref 35.0–45.0)
Hemoglobin: 15.5 g/dL (ref 11.7–15.5)
Lymphs Abs: 1829 cells/uL (ref 850–3900)
MCH: 31.4 pg (ref 27.0–33.0)
MCHC: 34.2 g/dL (ref 32.0–36.0)
MCV: 91.7 fL (ref 80.0–100.0)
MONOS PCT: 7 %
MPV: 10.8 fL (ref 7.5–12.5)
NEUTROS ABS: 3794 {cells}/uL (ref 1500–7800)
Neutrophils Relative %: 61.2 %
Platelets: 259 10*3/uL (ref 140–400)
RBC: 4.94 10*6/uL (ref 3.80–5.10)
RDW: 12 % (ref 11.0–15.0)
Total Lymphocyte: 29.5 %
WBC mixed population: 434 cells/uL (ref 200–950)
WBC: 6.2 10*3/uL (ref 3.8–10.8)

## 2018-05-20 LAB — COMPLETE METABOLIC PANEL WITH GFR
AG Ratio: 2 (calc) (ref 1.0–2.5)
ALBUMIN MSPROF: 4.8 g/dL (ref 3.6–5.1)
ALT: 16 U/L (ref 6–29)
AST: 15 U/L (ref 10–35)
Alkaline phosphatase (APISO): 106 U/L (ref 33–130)
BILIRUBIN TOTAL: 0.5 mg/dL (ref 0.2–1.2)
BUN: 12 mg/dL (ref 7–25)
CO2: 25 mmol/L (ref 20–32)
CREATININE: 0.77 mg/dL (ref 0.50–1.05)
Calcium: 9.7 mg/dL (ref 8.6–10.4)
Chloride: 104 mmol/L (ref 98–110)
GFR, EST AFRICAN AMERICAN: 98 mL/min/{1.73_m2} (ref >=60)
GFR, Est Non African American: 85 mL/min/{1.73_m2} (ref >=60)
GLUCOSE: 197 mg/dL — AB (ref 65–99)
Globulin: 2.4 g/dL (calc) (ref 1.9–3.7)
Potassium: 5 mmol/L (ref 3.5–5.3)
Sodium: 140 mmol/L (ref 135–146)
TOTAL PROTEIN: 7.2 g/dL (ref 6.1–8.1)

## 2018-05-20 LAB — VITAMIN D 25 HYDROXY (VIT D DEFICIENCY, FRACTURES): Vit D, 25-Hydroxy: 24 ng/mL — ABNORMAL LOW (ref 30–100)

## 2018-05-20 LAB — MAGNESIUM: Magnesium: 2.1 mg/dL (ref 1.5–2.5)

## 2018-05-20 LAB — MICROALBUMIN / CREATININE URINE RATIO
Creatinine, Urine: 42 mg/dL (ref 20–275)
MICROALB UR: 0.7 mg/dL
Microalb Creat Ratio: 17 mcg/mg creat (ref <30)

## 2018-05-20 LAB — TSH: TSH: 0.69 m[IU]/L (ref 0.40–4.50)

## 2018-05-21 ENCOUNTER — Ambulatory Visit (INDEPENDENT_AMBULATORY_CARE_PROVIDER_SITE_OTHER): Payer: 59 | Admitting: Pulmonary Disease

## 2018-05-21 ENCOUNTER — Ambulatory Visit: Payer: 59 | Admitting: Pulmonary Disease

## 2018-05-21 ENCOUNTER — Encounter: Payer: Self-pay | Admitting: Pulmonary Disease

## 2018-05-21 VITALS — BP 118/68 | HR 88 | Ht 62.8 in | Wt 181.0 lb

## 2018-05-21 DIAGNOSIS — R059 Cough, unspecified: Secondary | ICD-10-CM

## 2018-05-21 DIAGNOSIS — R05 Cough: Secondary | ICD-10-CM | POA: Diagnosis not present

## 2018-05-21 LAB — PULMONARY FUNCTION TEST
DL/VA % PRED: 128 %
DL/VA: 5.97 ml/min/mmHg/L
DLCO COR: 21.82 ml/min/mmHg
DLCO cor % pred: 96 %
DLCO unc % pred: 102 %
DLCO unc: 23.11 ml/min/mmHg
FEF 25-75 Post: 2.13 L/sec
FEF 25-75 Pre: 2.78 L/sec
FEF2575-%Change-Post: -23 %
FEF2575-%PRED-PRE: 121 %
FEF2575-%Pred-Post: 93 %
FEV1-%Change-Post: -4 %
FEV1-%Pred-Post: 81 %
FEV1-%Pred-Pre: 84 %
FEV1-POST: 1.99 L
FEV1-Pre: 2.07 L
FEV1FVC-%CHANGE-POST: -2 %
FEV1FVC-%Pred-Pre: 108 %
FEV6-%CHANGE-POST: -7 %
FEV6-%Pred-Post: 74 %
FEV6-%Pred-Pre: 80 %
FEV6-Post: 2.27 L
FEV6-Pre: 2.44 L
FEV6FVC-%Pred-Post: 103 %
FEV6FVC-%Pred-Pre: 103 %
FVC-%Change-Post: -1 %
FVC-%PRED-POST: 75 %
FVC-%Pred-Pre: 77 %
FVC-Post: 2.4 L
FVC-Pre: 2.44 L
POST FEV1/FVC RATIO: 83 %
PRE FEV1/FVC RATIO: 85 %
Post FEV6/FVC ratio: 100 %
Pre FEV6/FVC Ratio: 100 %
RV % pred: 106 %
RV: 2.04 L
TLC % PRED: 93 %
TLC: 4.54 L

## 2018-05-21 NOTE — Progress Notes (Signed)
Candice [name]    [id number]    [birth year]  Primary Care Physician:McKeown, Chrissie Noa, MD  Referring Physician: Lucky Cowboy, MD [address] [place], [place] [address]  Chief complaint: Chronic cough  HPI: 62 year old with history of smoking, diabetes, hyperlipidemia, anxiety.  Has chronic cough for the past 1 month after an episode of likely upper respiratory tract infection. Seen by her primary care and given doxycycline which did not improve symptoms.  She is then given a shot of Rocephin and Biaxin. Reports that the breathing has improved. Chest x-ray did not show any acute abnormality.  She has slightly worsening symptoms of sinusitis, postnasal drip over the past few weeks which she attributes to allergy  Went to [place] for a beach vacation the past month and was able to walk up to 8 miles a day without any problem. She is given Symbicort by her primary care but is not using it as her breathing is doing well.  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  Quit in January 2019 Travel History: She is lived in [place] all her life.  No recent travel.  Interim history: States that cough is improved with chlorpheniramine, steroid nasal spray.  Does not report any new respiratory symptoms.  No dyspnea, cough, wheezing, sputum.  Outpatient Encounter Medications as of 05/21/2018  Medication Sig  . aspirin EC 81 MG tablet Take 81 mg by mouth daily.  . chlorpheniramine (CHLOR-TRIMETON) 4 MG tablet Take 2 tablets (8 mg total) by mouth 3 (three) times daily.  . Cholecalciferol (VITAMIN D3) 2000 units TABS Take 4,000 Units by mouth.  . citalopram (CELEXA) 40 MG tablet Take 1 tab daily by mouth for mood.  . Dulaglutide ([name]) 1.5 MG/0.5ML SOPN Inject 1.5 mg into the skin once a week.  . empagliflozin  ([name]) 25 MG TABS tablet Take 25 mg by mouth daily.  Marland Kitchen glucose blood (ONETOUCH VERIO) test strip Use 2x a day  . meclizine (ANTIVERT) 25 MG tablet 1/2-1 pill up to 3 times daily for motion sickness/dizziness  . meloxicam (MOBIC) 15 MG tablet Take one daily with food for 2 weeks, can take with tylenol, can not take with aleve, iburpofen, then as needed daily for pain  . metFORMIN (GLUCOPHAGE-XR) 500 MG 24 hr tablet Take 1 tablet (500 mg total) by mouth 3 (three) times daily with meals.  . montelukast (SINGULAIR) 10 MG tablet Take 1 tablet (10 mg total) by mouth at bedtime.  Letta Pate DELICA LANCETS 33G MISC Check 2x a day  . rosuvastatin (CRESTOR) 40 MG tablet Take 1/2-1 tab as directed daily in the evening for cholesterol.  . terconazole (TERAZOL 7) 0.4 % vaginal cream Place 1 applicator vaginally at bedtime.  . [DISCONTINUED] diclofenac sodium (VOLTAREN) 1 % GEL Apply 2 g topically 4 (four) times daily. (Patient taking differently: Apply 2 g topically as needed. )   No facility-administered encounter medications on file as of 05/21/2018.     Allergies as of 05/21/2018 - Review Complete 05/21/2018  Allergen Reaction Noted  . Levaquin [levofloxacin] Hives and Other (See Comments) 12/02/2012  . Eggs or egg-derived products  10/20/2013  . Iodine  03/31/2013  . Levofloxacin in d5w Hives 01/09/2014  . Metformin and related Diarrhea 10/20/2013  . Shellfish allergy  10/20/2013  . Vitamin d analogs Diarrhea 10/20/2013  .  Amoxicillin Rash 10/20/2013  . Kombiglyze [saxagliptin-metformin er] Other (See Comments) 10/20/2013  . Latex Itching and Rash 12/02/2012  . Penicillins Rash 10/20/2013    Past Medical History:  Diagnosis Date  . Anxiety   . Diabetes mellitus without complication (HCC)   . Hx of colonic polyps    Fhx Colon cancer  . Hyperlipidemia   . Hypocholesteremia   . Tobacco abuse     Past Surgical History:  Procedure Laterality Date  . APPENDECTOMY    . CESAREAN SECTION     . CHOLECYSTECTOMY    . TONSILLECTOMY      Family History  Problem Relation Age of Onset  . AAA (abdominal aortic aneurysm) Mother   . Hyperlipidemia Mother   . Heart attack Brother 55  . Pneumonia Maternal Grandmother   . Cancer Sister        Colon  . Diabetes Sister   . Heart disease Sister     Social History   Socioeconomic History  . Marital status: Married    Spouse name: Not on file  . Number of children: Not on file  . Years of education: Not on file  . Highest education level: Not on file  Occupational History  . Not on file  Social Needs  . Financial resource strain: Not on file  . Food insecurity:    Worry: Not on file    Inability: Not on file  . Transportation needs:    Medical: Not on file    Non-medical: Not on file  Tobacco Use  . Smoking status: Former Smoker    Packs/day: 0.50    Years: 30.00    Pack years: 15.00    Types: Cigarettes    Last attempt to quit: 10/15/2017    Years since quitting: 0.5  . Smokeless tobacco: Never Used  Substance and Sexual Activity  . Alcohol use: No    Alcohol/week: 0.0 standard drinks  . Drug use: No  . Sexual activity: Yes  Lifestyle  . Physical activity:    Days per week: Not on file    Minutes per session: Not on file  . Stress: Not on file  Relationships  . Social connections:    Talks on phone: Not on file    Gets together: Not on file    Attends religious service: Not on file    Active member of club or organization: Not on file    Attends meetings of clubs or organizations: Not on file    Relationship status: Not on file  . Intimate partner violence:    Fear of current or ex partner: Not on file    Emotionally abused: Not on file    Physically abused: Not on file    Forced sexual activity: Not on file  Other Topics Concern  . Not on file  Social History Narrative  . Not on file   Review of systems: Review of Systems  Constitutional: Negative for fever and chills.  HENT: Negative.   Eyes:  Negative for blurred vision.  Respiratory: as per HPI  Cardiovascular: Negative for chest pain and palpitations.  Gastrointestinal: Negative for vomiting, diarrhea, blood per rectum. Genitourinary: Negative for dysuria, urgency, frequency and hematuria.  Musculoskeletal: Negative for myalgias, back pain and joint pain.  Skin: Negative for itching and rash.  Neurological: Negative for dizziness, tremors, focal weakness, seizures and loss of consciousness.  Endo/Heme/Allergies: Negative for environmental allergies.  Psychiatric/Behavioral: Negative for depression, suicidal ideas and hallucinations.  All other systems reviewed  and are negative.  Physical Exam: Blood pressure 118/68, pulse 88, height 5' 2.8" (1.595 m), weight 181 lb (82.1 kg), SpO2 95 %. Gen:      No acute distress HEENT:  EOMI, sclera anicteric Neck:     No masses; no thyromegaly Lungs:    Clear to auscultation bilaterally; normal respiratory effort CV:         Regular rate and rhythm; no murmurs Abd:      + bowel sounds; soft, non-tender; no palpable masses, no distension Ext:    No edema; adequate peripheral perfusion Skin:      Warm and dry; no rash Neuro: alert and oriented x 3 Psych: normal mood and affect  Data Reviewed: FENO 01/21/18-14  Chest x-ray 12/18/17-no acute cardiopulmonary abnormality CT chest 04/06/15-lungs are clear, no lymphadenopathy I reviewed the images personally.  PFTs 05/21/2018 FVC 2.40 [75%), FEV1 1.99 [1%], F/F 83, TLC 93%, DLCO 102% Normal test  Assessment:  Evaluation for cough Likely upper airway cough from allergies, postnasal drip.  Denies GERD symptoms Symptoms have improved with chlorpheniramine antihistamine and steroid nasal spray  PFTs reviewed today which showed a normal test with no evidence of COPD She can follow-up in pulmonary clinic as needed  Plan/Recommendations: - Chlorphentermine, steroid nasal spray -Follow-up as needed.  Chilton GreathousePraveen Gennette Shadix MD [facility] 05/21/2018, 3:14 PM  CC: Lucky CowboyMcKeown, William, MD

## 2018-05-21 NOTE — Progress Notes (Signed)
PFT completed today. 05/21/18  

## 2018-05-21 NOTE — Patient Instructions (Signed)
I reviewed your pulmonary function test which are normal.  There is no evidence of COPD or asthma Continue using the antiallergy tablets, steroid nasal spray for cough Follow-up as needed.

## 2018-06-11 ENCOUNTER — Other Ambulatory Visit: Payer: Self-pay | Admitting: Adult Health

## 2018-07-05 ENCOUNTER — Other Ambulatory Visit: Payer: Self-pay | Admitting: Internal Medicine

## 2018-07-05 DIAGNOSIS — K5792 Diverticulitis of intestine, part unspecified, without perforation or abscess without bleeding: Secondary | ICD-10-CM

## 2018-07-05 MED ORDER — DOXYCYCLINE MONOHYDRATE 100 MG PO CAPS: ORAL_CAPSULE | ORAL | 0 refills | Status: DC

## 2018-07-05 MED ORDER — METRONIDAZOLE 500 MG PO TABS
ORAL_TABLET | ORAL | 0 refills | Status: DC
Start: 2018-07-05 — End: 2018-09-20

## 2018-07-09 ENCOUNTER — Ambulatory Visit: Payer: 59 | Admitting: Internal Medicine

## 2018-07-09 DIAGNOSIS — Z0289 Encounter for other administrative examinations: Secondary | ICD-10-CM

## 2018-07-09 NOTE — Progress Notes (Deleted)
Patient ID: Candice [name], female   DOB: [birth year], 62 y.o.   MRN: [id number]   HPI: Candice [name] is a 62 y.o.-year-old female, s returning for follow-up for DM2, dx in 2012-2013, non-insulin-dependent, uncontrolled, without long term complications.  Last visit 3.5 months ago. PCP: Judd Gaudier PA  Last hemoglobin A1c was: Lab Results  Component Value Date   HGBA1C 9.5 (A) 03/12/2018   HGBA1C 9.8 (H) 10/15/2017   HGBA1C 8.8 (H) 04/08/2017   Pt is on a regimen of: - Metformin ER 500 mg 3 times daily with meals - [name] 25 mg in a.m. - [name] 1.5 mg weekly - started 03/2018 Used Farxiga in the past >> not covered anymore. No yeast inf's. She was on Glipizide 5 mg 2x a day before meals She was on Victoza >> tolerated it well.  At last visit, she was not checking sugars.  Not checking once a day: - am: n/c - 2h after b'fast: 220 - before lunch: n/c - 2h after lunch: n/c - before dinner: n/c - 2h after dinner: n/c - bedtime: n/c - nighttime: n/c Lowest sugar was 110 >> ***; she has hypoglycemia awareness but unclear at which level. Highest sugar was 285 >> ***.  Glucometer: none  Pt's meals are: - Breakfast: PB crackers, fruit, sausage McMuffin at 10 am - Lunch: chicken salad wraps, sandwiches, baked chips - Dinner: veggies, meats, salad - Snacks: crackers  -No CKD, last BUN/creatinine:  Lab Results  Component Value Date   BUN 12 05/19/2018   BUN 14 10/15/2017   CREATININE 0.77 05/19/2018   CREATININE 0.79 10/15/2017   -+ HL; last set of lipids: Lab Results  Component Value Date   CHOL 173 05/19/2018   HDL 40 (L) 05/19/2018   LDLCALC 96 05/19/2018   TRIG 260 (H) 05/19/2018   CHOLHDL 4.3 05/19/2018  On Crestor 20.  - last eye exam was in 02/2018: No DR, now daily.  + Cataract  - No numbness and tingling in her feet.  On ASA 81  Pt has FH of DM in sister.  ROS: Constitutional: no weight gain/no weight loss, no fatigue, no subjective  hyperthermia, no subjective hypothermia Eyes: no blurry vision, no xerophthalmia ENT: no sore throat, no nodules palpated in throat, no dysphagia, no odynophagia, no hoarseness Cardiovascular: no CP/no SOB/no palpitations/no leg swelling Respiratory: no cough/no SOB/no wheezing Gastrointestinal: no N/no V/no D/no C/no acid reflux Musculoskeletal: no muscle aches/no joint aches Skin: no rashes, no hair loss Neurological: no tremors/no numbness/no tingling/no dizziness  I reviewed pt's medications, allergies, PMH, social hx, family hx, and changes were documented in the history of present illness. Otherwise, unchanged from my initial visit note.  Past Medical History:  Diagnosis Date  . Anxiety   . Diabetes mellitus without complication (HCC)   . Hx of colonic polyps    Fhx Colon cancer  . Hyperlipidemia   . Hypocholesteremia   . Tobacco abuse    Past Surgical History:  Procedure Laterality Date  . APPENDECTOMY    . CESAREAN SECTION    . CHOLECYSTECTOMY    . TONSILLECTOMY     Social History   Socioeconomic History  . Marital status: Married    Spouse name: Not on file  . Number of children: 1  . Years of education: Not on file  . Highest education level: Not on file  Occupational History  .  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 Financial resource strain: Not on  file  . Food insecurity:    Worry: Not on file    Inability: Not on file  . Transportation needs:    Medical: Not on file    Non-medical: Not on file  Tobacco Use  . Smoking status: Former Smoker    Packs/day: 0.50    Years: 30.00    Pack years: 15.00    Types: Cigarettes    Last attempt to quit: 10/15/2017    Years since quitting: 0.4  . Smokeless tobacco: Never Used  Substance and Sexual Activity  . Alcohol use: No    Alcohol/week: 0.0 oz  . Drug use: No   Current Outpatient Medications on File Prior to Visit  Medication Sig Dispense Refill  . aspirin EC 81 MG tablet Take 81 mg by  mouth daily.    . chlorpheniramine (CHLOR-TRIMETON) 4 MG tablet Take 2 tablets (8 mg total) by mouth 3 (three) times daily. 190 tablet 0  . Cholecalciferol (VITAMIN D3) 2000 units TABS Take 4,000 Units by mouth.    . citalopram (CELEXA) 40 MG tablet Take 1 tab daily by mouth for mood. 90 tablet 1  . doxycycline (MONODOX) 100 MG capsule Take 1 capsule 2 x /day for Diverticulitis 20 capsule 0  . Dulaglutide ([name]) 1.5 MG/0.5ML SOPN Inject 1.5 mg into the skin once a week. 4 pen 5  . empagliflozin ([name]) 25 MG TABS tablet Take 25 mg by mouth daily. 90 tablet 3  . glucose blood (ONETOUCH VERIO) test strip Use 2x a day 200 each 3  . meclizine (ANTIVERT) 25 MG tablet 1/2-1 pill up to 3 times daily for motion sickness/dizziness 30 tablet 0  . meloxicam (MOBIC) 15 MG tablet Take one daily with food for 2 weeks, can take with tylenol, can not take with aleve, iburpofen, then as needed daily for pain 30 tablet 1  . metFORMIN (GLUCOPHAGE-XR) 500 MG 24 hr tablet Take 1 to 2 tablets with a meal 2 x /day for Diabetes 360 tablet 0  . metroNIDAZOLE (FLAGYL) 500 MG tablet Take 1 capsule 2 x /day with food for Diverticulitis 30 tablet 0  . montelukast (SINGULAIR) 10 MG tablet Take 1 tablet (10 mg total) by mouth at bedtime. 90 tablet 3  . ONETOUCH DELICA LANCETS 33G MISC Check 2x a day 200 each 3  . rosuvastatin (CRESTOR) 40 MG tablet Take 1/2-1 tab as directed daily in the evening for cholesterol. 90 tablet 1  . terconazole (TERAZOL 7) 0.4 % vaginal cream Place 1 applicator vaginally at bedtime. 45 g 2   No current facility-administered medications on file prior to visit.    Allergies  Allergen Reactions  . Levaquin [Levofloxacin] Hives and Other (See Comments)    Tingling sensation to mouth, near LOC, blurred vision,    . Eggs Or Egg-Derived Products     Unknown reaction   . Iodine     Unknown reaction   . Levofloxacin In D5w Hives    Hives  . Metformin And Related Diarrhea  . Shellfish  Allergy     Unknown reaction   . Vitamin D Analogs Diarrhea    High dose Vitamin D caused diarrhea  . Amoxicillin Rash  . Kombiglyze [Saxagliptin-Metformin Er] Other (See Comments)    Bloating  . Latex Itching and Rash  . Penicillins Rash   Family History  Problem Relation Age of Onset  . AAA (abdominal aortic aneurysm) Mother   . Hyperlipidemia Mother   . Heart attack Brother 55  . Pneumonia  Maternal Grandmother   . Cancer Sister        Colon  . Diabetes Sister   . Heart disease Sister     PE: There were no vitals taken for this visit. Wt Readings from Last 3 Encounters:  05/21/18 181 lb (82.1 kg)  05/19/18 179 lb (81.2 kg)  03/12/18 181 lb (82.1 kg)   Constitutional: overweight, in NAD Eyes: PERRLA, EOMI, no exophthalmos ENT: moist mucous membranes, no thyromegaly, no cervical lymphadenopathy Cardiovascular: RRR, No MRG Respiratory: CTA B Gastrointestinal: abdomen soft, NT, ND, BS+ Musculoskeletal: no deformities, strength intact in all 4 Skin: moist, warm, no rashes Neurological: no tremor with outstretched hands, DTR normal in all 4  ASSESSMENT: 1. DM2, non-insulin-dependent, uncontrolled, without long term complications, but with hyperglycemia She does not have a family history of medullary thyroid cancer or personal history of pancreatitis.    2. Yeast vaginitis  3. HL  PLAN:  1. Patient with long-standing, uncontrolled, type 2 diabetes, and GLP-1 receptor agonist started at last visit.  Her HbA1c at that time was slightly better, but still very high, at 9.4%.  She was not checking sugars at home and I strongly advised her to start.  We gave her a One Touch Verio glucometer then.  - I suggested to:  Patient Instructions  Please continue: - Metformin ER 500 mg 3x a day with meals - [name] 25 mg before breakfast - [name] 1.5 mg weekly  Please return in 3-4 months with your sugar log.   - today, HbA1c is 7%  - continue checking sugars at different  times of the day - check 1x a day, rotating checks - advised for yearly eye exams >> she is UTD - Return to clinic in 3-4 mo with sugar log    2.  Yeast vaginitis -Most likely related to [name] -Was on Diflucan in the past -started terconazole at last visit -If persists, she will need to come off [name]  3. HL - Reviewed latest lipid panel from 05/2018: LDL at goal, but triglycerides high, HDL low Lab Results  Component Value Date   CHOL 173 05/19/2018   HDL 40 (L) 05/19/2018   LDLCALC 96 05/19/2018   TRIG 260 (H) 05/19/2018   CHOLHDL 4.3 05/19/2018  - Continues Crestor without side effects.   Carlus Pavlov, MD PhD [facility]

## 2018-08-16 ENCOUNTER — Ambulatory Visit: Payer: 59 | Admitting: Internal Medicine

## 2018-08-16 ENCOUNTER — Encounter: Payer: Self-pay | Admitting: Internal Medicine

## 2018-08-16 VITALS — BP 110/70 | HR 92 | Ht 62.8 in | Wt 176.0 lb

## 2018-08-16 DIAGNOSIS — E782 Mixed hyperlipidemia: Secondary | ICD-10-CM | POA: Diagnosis not present

## 2018-08-16 DIAGNOSIS — B373 Candidiasis of vulva and vagina: Secondary | ICD-10-CM

## 2018-08-16 DIAGNOSIS — B3731 Acute candidiasis of vulva and vagina: Secondary | ICD-10-CM

## 2018-08-16 DIAGNOSIS — E119 Type 2 diabetes mellitus without complications: Secondary | ICD-10-CM

## 2018-08-16 LAB — POCT GLYCOSYLATED HEMOGLOBIN (HGB A1C): HEMOGLOBIN A1C: 7.7 % — AB (ref 4.0–5.6)

## 2018-08-16 MED ORDER — DULAGLUTIDE 1.5 MG/0.5ML ~~LOC~~ SOAJ: 1.5000 mg | SUBCUTANEOUS | 3 refills | Status: DC

## 2018-08-16 MED ORDER — EMPAGLIFLOZIN 25 MG PO TABS: 25.0000 mg | ORAL_TABLET | Freq: Every day | ORAL | 3 refills | Status: DC

## 2018-08-16 MED ORDER — GLIPIZIDE 5 MG PO TABS: 2.5000 mg | ORAL_TABLET | Freq: Two times a day (BID) | ORAL | 3 refills | Status: DC

## 2018-08-16 NOTE — Progress Notes (Signed)
Patient ID: Candice [name], female   DOB: [birth year], 62 y.o.   MRN: [id number]   HPI: Candice [name] is a 62 y.o.-year-old female, returning for follow-up for DM2, dx in 2012-2013, non-insulin-dependent, uncontrolled, without long term complications.  Last visit 5.5 months ago. PCP: Judd Gaudier PA  She has a URI.  Last hemoglobin A1c was: Lab Results  Component Value Date   HGBA1C 9.5 (A) 03/12/2018   HGBA1C 9.8 (H) 10/15/2017   HGBA1C 8.8 (H) 04/08/2017   Pt is on a regimen of: -  >> stopped b/c nausea and AP - [name] 25 mg before breakfast >> yeast vaginitis - [name] 1.5 mg weekly -started 02/2018   Used Farxiga in the past >> not covered anymore. No yeast inf's. She was on Glipizide 5 mg 2x a day before meals She was on Victoza >> tolerated it well.  She was not checking sugars at last visit, since then, she checks once a day: - am: n/c >> 110, 150-165, 200 - 2h after b'fast: 220 >> n/c - before lunch: n/c - 2h after lunch: n/c - before dinner: n/c - 2h after dinner: n/c - bedtime: n/c >> 180-190s - nighttime: n/c Lowest sugar was 110 >> 110; it is unclear at which level she has hypoglycemia awareness. Highest sugar was 285 >> 200.  Glucometer: none  Pt's meals are: - Breakfast: PB crackers, fruit, sausage McMuffin at 10 am - Lunch: chicken salad wraps, sandwiches, baked chips - Dinner: veggies, meats, salad - Snacks: crackers  -No CKD, last BUN/creatinine:  Lab Results  Component Value Date   BUN 12 05/19/2018   BUN 14 10/15/2017   CREATININE 0.77 05/19/2018   CREATININE 0.79 10/15/2017   -+ HL; last set of lipids: Lab Results  Component Value Date   CHOL 173 05/19/2018   HDL 40 (L) 05/19/2018   LDLCALC 96 05/19/2018   TRIG 260 (H) 05/19/2018   CHOLHDL 4.3 05/19/2018  On Crestor 20.  - last eye exam was in 02/2018: No DR, + cataract.   -Denies numbness and tingling in her feet.  On ASA 81.  Pt has FH of DM in  sister.  ROS: Constitutional: no weight gain/no weight loss, no fatigue, + subjective hyperthermia, no subjective hypothermia Eyes: no blurry vision, no xerophthalmia            ENT: no sore throat, no nodules palpated in neck, no dysphagia, no odynophagia, + hoarseness, + congestion Cardiovascular: no CP/no SOB/no palpitations/no leg swelling Respiratory: no cough/no SOB/no wheezing Gastrointestinal: no N/no V/no D/no C/no acid reflux Musculoskeletal: no muscle aches/no joint aches Skin: no rashes, no hair loss Neurological: no tremors/no numbness/no tingling/no dizziness  I reviewed pt's medications, allergies, PMH, social hx, family hx, and changes were documented in the history of present illness. Otherwise, unchanged from my initial visit note.  Past Medical History:  Diagnosis Date  . Anxiety   . Diabetes mellitus without complication (HCC)   . Hx of colonic polyps    Fhx Colon cancer  . Hyperlipidemia   . Hypocholesteremia   . Tobacco abuse    Past Surgical History:  Procedure Laterality Date  . APPENDECTOMY    . CESAREAN SECTION    . CHOLECYSTECTOMY    . TONSILLECTOMY     Social History   Socioeconomic History  . Marital status: Married    Spouse name: Not on file  . Number of children: 1  . Years of education: Not on file  . Highest  education level: Not on file  Occupational History  .  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 Financial resource strain: Not on file  . Food insecurity:    Worry: Not on file    Inability: Not on file  . Transportation needs:    Medical: Not on file    Non-medical: Not on file  Tobacco Use  . Smoking status: Former Smoker    Packs/day: 0.50    Years: 30.00    Pack years: 15.00    Types: Cigarettes    Last attempt to quit: 10/15/2017    Years since quitting: 0.4  . Smokeless tobacco: Never Used  Substance and Sexual Activity  . Alcohol use: No    Alcohol/week: 0.0 oz  . Drug use: No   Current Outpatient  Medications on File Prior to Visit  Medication Sig Dispense Refill  . aspirin EC 81 MG tablet Take 81 mg by mouth daily.    . chlorpheniramine (CHLOR-TRIMETON) 4 MG tablet Take 2 tablets (8 mg total) by mouth 3 (three) times daily. 190 tablet 0  . Cholecalciferol (VITAMIN D3) 2000 units TABS Take 4,000 Units by mouth.    . citalopram (CELEXA) 40 MG tablet Take 1 tab daily by mouth for mood. 90 tablet 1  . doxycycline (MONODOX) 100 MG capsule Take 1 capsule 2 x /day for Diverticulitis 20 capsule 0  . Dulaglutide ([name]) 1.5 MG/0.5ML SOPN Inject 1.5 mg into the skin once a week. 4 pen 5  . empagliflozin ([name]) 25 MG TABS tablet Take 25 mg by mouth daily. 90 tablet 3  . glucose blood (ONETOUCH VERIO) test strip Use 2x a day 200 each 3  . meclizine (ANTIVERT) 25 MG tablet 1/2-1 pill up to 3 times daily for motion sickness/dizziness 30 tablet 0  . meloxicam (MOBIC) 15 MG tablet Take one daily with food for 2 weeks, can take with tylenol, can not take with aleve, iburpofen, then as needed daily for pain 30 tablet 1  . metFORMIN (GLUCOPHAGE-XR) 500 MG 24 hr tablet Take 1 to 2 tablets with a meal 2 x /day for Diabetes 360 tablet 0  . metroNIDAZOLE (FLAGYL) 500 MG tablet Take 1 capsule 2 x /day with food for Diverticulitis 30 tablet 0  . montelukast (SINGULAIR) 10 MG tablet Take 1 tablet (10 mg total) by mouth at bedtime. 90 tablet 3  . ONETOUCH DELICA LANCETS 33G MISC Check 2x a day 200 each 3  . rosuvastatin (CRESTOR) 40 MG tablet Take 1/2-1 tab as directed daily in the evening for cholesterol. 90 tablet 1  . terconazole (TERAZOL 7) 0.4 % vaginal cream Place 1 applicator vaginally at bedtime. 45 g 2   No current facility-administered medications on file prior to visit.    Allergies  Allergen Reactions  . Levaquin [Levofloxacin] Hives and Other (See Comments)    Tingling sensation to mouth, near LOC, blurred vision,    . Eggs Or Egg-Derived Products     Unknown reaction   . Iodine      Unknown reaction   . Levofloxacin In D5w Hives    Hives  . Metformin And Related Diarrhea  . Shellfish Allergy     Unknown reaction   . Vitamin D Analogs Diarrhea    High dose Vitamin D caused diarrhea  . Amoxicillin Rash  . Kombiglyze [Saxagliptin-Metformin Er] Other (See Comments)    Bloating  . Latex Itching and Rash  . Penicillins Rash   Family History  Problem  Relation Age of Onset  . AAA (abdominal aortic aneurysm) Mother   . Hyperlipidemia Mother   . Heart attack Brother 55  . Pneumonia Maternal Grandmother   . Cancer Sister        Colon  . Diabetes Sister   . Heart disease Sister     PE: There were no vitals taken for this visit. Wt Readings from Last 3 Encounters:  05/21/18 181 lb (82.1 kg)  05/19/18 179 lb (81.2 kg)  03/12/18 181 lb (82.1 kg)   Constitutional: overweight, in NAD Eyes: PERRLA, EOMI, no exophthalmos ENT: moist mucous membranes, no thyromegaly, no cervical lymphadenopathy Cardiovascular: RRR, No MRG Respiratory: CTA B Gastrointestinal: abdomen soft, NT, ND, BS+ Musculoskeletal: no deformities, strength intact in all 4 Skin: moist, warm, no rashes Neurological: no tremor with outstretched hands, DTR normal in all 4  ASSESSMENT: 1. DM2, non-insulin-dependent, uncontrolled, without long term complications, but with hyperglycemia  2. Yeast vaginitis  3. HL  PLAN:  1. Patient with long-standing, uncontrolled, type 2 diabetes, on oral antidiabetic regimen, and now GLP-1 receptor agonist added at last visit.  At that time her HbA1c was 9.4%, slightly better, but still very high.  We continue [name] and metformin.  At that time, she was not checking sugars and I strongly advised her to start doing so. - At this visit, sugars are still higher than goal, but I cannot compare them to last visit since she was not checking sugars at that time.  Her sugars in the morning can be high, rarely up to 200.  We discussed about restarting a low-dose metformin  ER, which she stopped due to abdominal pain and diarrhea.  She tells me that shcould not tolerate 2 or 3 doses a day but she did well with 1 dose a day.  We will restart this.  However, I suspect that this will not be enough to improve her sugars so we discussed about adding glipizide, low dose, before breakfast and before dinner. - I suggested to:  Patient Instructions  Please continue: - [name] 25 mg before breakfast - [name] 1.5 mg weekly.  Please try to add 1 tablet of 500 mg Metformin ER once a day.  If sugars are still high, start: - Glipizide 2.5 mg before b'fast and before dinner (you can take 5 mg before a large meal)  Please return in 3 months with your sugar log.   - today, HbA1c is 7.7% (better) - continue checking sugars at different times of the day - check 1x a day, rotating checks - advised for yearly eye exams >> she is UTD - She is allergic to eggs so cannot have flu shot - Return to clinic in 3 mo with sugar log   2.  Yeast vaginitis -At last visit, we called in Diflucan but also started terconazole cream: Terconazole Cream 0.4%: - insert 1 applicatorful vaginally at bedtime every night for 1 week, then - insert 1/2 applicatorful at bedtime once weekly indefinitely to maintain effect -No more yeast infections after she started terconazole  - Reviewed latest lipid panel from 05/2018: LDL lower than 100, triglycerides high, HDL low Lab Results  Component Value Date   CHOL 173 05/19/2018   HDL 40 (L) 05/19/2018   LDLCALC 96 05/19/2018   TRIG 260 (H) 05/19/2018   CHOLHDL 4.3 05/19/2018  - Continues Crestor without side effects.  Carlus Pavlov, MD PhD [facility]

## 2018-08-16 NOTE — Patient Instructions (Addendum)
Please continue: - [name] 25 mg before breakfast - [name] 1.5 mg weekly.  Please try to add 1 tablet of 500 mg Metformin ER once a day.  If sugars are still high, start: - Glipizide 2.5 mg before b'fast and before dinner (you can take 5 mg before a large meal)  Please return in 3 months with your sugar log.

## 2018-09-02 ENCOUNTER — Ambulatory Visit: Payer: Self-pay | Admitting: Adult Health

## 2018-09-15 NOTE — Progress Notes (Signed)
FOLLOW UP  Assessment and Plan:   Cholesterol Currently above goal; taking rosuvastatin 40 mg MWF since last visit  Continue low cholesterol diet and exercise.  Check lipid panel.   Diabetes without complications Now managed by Dr. Elvera LennoxGherghe Continue medication: jardiance, trulicity, metformin 500 mg x 1, glipizide 2.5 mg BID  Continue diet and exercise.  Perform daily foot/skin check, notify office of any concerning changes.  Defer A1C to Dr. Charlean SanfilippoGherghe's office  Obesity with co morbidities Long discussion about weight loss, diet, and exercise Recommended diet heavy in fruits and veggies and low in animal meats, cheeses, and dairy products, appropriate calorie intake Discussed ideal weight for height Patient will work on resuming previous diet, start exercising Will follow up in 3 months  Vitamin D Def Very low at last visit; has started supplementation  Check Vit D level, continue to counsel   Anxiety Doing well with Celexa 40 mg  Stress management techniques discussed, increase water, good sleep hygiene discussed, increase exercise, and increase veggies.   Former smoker Doing well with cessation since Jan 2019; seeing pulmonology and getting PFTs  Tendinitis of left triceps Start mobic (has script) daily for 2-4 weeks then PRN, recommended rest initially, then can try gentle ROM and triceps rehab exercises Cautioned should suddenly worsen or "lump" worsen to call immediately Discussed ortho referral today, declines at this time  Continue diet and meds as discussed. Further disposition pending results of labs. Discussed med's effects and SE's.   Over 30 minutes of exam, counseling, chart review, and critical decision making was performed.   Future Appointments  Date Time Provider Department Center  11/26/2018  2:00 PM Carlus PavlovGherghe, Cristina, MD [id number] None    ----------------------------------------------------------------------------------------------------------------------  HPI 62 y.o. female  presents for 3 month follow up on blood pressure, cholesterol, T2 diabetes, obesity and vitamin D deficiency.   She report L posterior/lateral upper arm pain that began 1.5 weeks ago; she reports ? palpable lump in the upper extremity with tenderness. Pain began suddenly, woke up with this pain; no trauma or notable event prior to onset. She describes pain as dull/achy, admits to radiation, pain is 5-6/10 and shoots up to 8/10. She has tried tylenol which didn't help significantly. She has mobic at home but hasn't tried this.   She reports she has had increased stress related to her daughter going through a divorce and moving in, CELEXA dose was increased to 40 mg at last visit and reports feeling much better.   BMI is Body mass index is 31.73 kg/m., she has not been working on diet and exercise. She has joined a new gym, has been going a few times with her daughter but work has been running late and hasn't been able to go. She plans to start walking nightly instead. She also plans to improve her diet, has been going to salad restaurant for lunch. She drinks water throughout the day after her 1 coffee in the AM.  Wt Readings from Last 3 Encounters:  09/16/18 178 lb (80.7 kg)  08/16/18 176 lb (79.8 kg)  05/21/18 181 lb (82.1 kg)   Today their BP is BP: 110/72  She does not workout. She denies chest pain, shortness of breath, dizziness.   She is on cholesterol medication (rosuvastatin 40 mg three days a week) and denies myalgias. Her cholesterol is not at goal. The cholesterol last visit was:   Lab Results  Component Value Date   CHOL 173 05/19/2018   HDL 40 (L) 05/19/2018  LDLCALC 96 05/19/2018   TRIG 260 (H) 05/19/2018   CHOLHDL 4.3 05/19/2018    She has not been working on diet and exercise for T2 diabetes (metformin 500 mg daily (intolerant of  higher dosage), jardiance, trulicity, glipizide 5 mg once daily if AM sugar ), now following with Dr. Elvera Lennox as well, and denies hypoglycemia , increased appetite, nausea, paresthesia of the feet, polydipsia, polyuria, visual disturbances and vomiting. She has been checking AM sugars regularly, reports fasting ranging 150-170. Does not check postprandial. Last A1C in the office was:  Lab Results  Component Value Date   HGBA1C 7.7 (A) 08/16/2018   Patient is newly back on Vitamin D supplement, taking 4000 but admits inconsistently, was very low at the last check:    Lab Results  Component Value Date   VD25OH 24 (L) 05/19/2018       Current Medications:  Current Outpatient Medications on File Prior to Visit  Medication Sig  . aspirin EC 81 MG tablet Take 81 mg by mouth daily.  . chlorpheniramine (CHLOR-TRIMETON) 4 MG tablet Take 2 tablets (8 mg total) by mouth 3 (three) times daily.  . Cholecalciferol (VITAMIN D3) 2000 units TABS Take 4,000 Units by mouth.  . citalopram (CELEXA) 40 MG tablet Take 1 tab daily by mouth for mood.  . doxycycline (MONODOX) 100 MG capsule Take 1 capsule 2 x /day for Diverticulitis  . Dulaglutide ([name]) 1.5 MG/0.5ML SOPN Inject 1.5 mg into the skin once a week.  . empagliflozin ([name]) 25 MG TABS tablet Take 25 mg by mouth daily.  Marland Kitchen glipiZIDE (GLUCOTROL) 5 MG tablet Take 0.5 tablets (2.5 mg total) by mouth 2 (two) times daily before a meal. (Patient taking differently: Take 5 mg by mouth as needed. )  . glucose blood (ONETOUCH VERIO) test strip Use 2x a day  . meclizine (ANTIVERT) 25 MG tablet 1/2-1 pill up to 3 times daily for motion sickness/dizziness (Patient taking differently: as needed. 1/2-1 pill up to 3 times daily for motion sickness/dizziness)  . meloxicam (MOBIC) 15 MG tablet Take one daily with food for 2 weeks, can take with tylenol, can not take with aleve, iburpofen, then as needed daily for pain  . metFORMIN (GLUCOPHAGE-XR) 500 MG 24 hr  tablet Take 1 to 2 tablets with a meal 2 x /day for Diabetes (Patient taking differently: daily with lunch. Take 1 to 2 tablets with a meal 2 x /day for Diabetes)  . metroNIDAZOLE (FLAGYL) 500 MG tablet Take 1 capsule 2 x /day with food for Diverticulitis  . montelukast (SINGULAIR) 10 MG tablet Take 1 tablet (10 mg total) by mouth at bedtime.  Letta Pate DELICA LANCETS 33G MISC Check 2x a day  . rosuvastatin (CRESTOR) 40 MG tablet Take 1/2-1 tab as directed daily in the evening for cholesterol.  . terconazole (TERAZOL 7) 0.4 % vaginal cream Place 1 applicator vaginally at bedtime.   No current facility-administered medications on file prior to visit.      Allergies:  Allergies  Allergen Reactions  . Levaquin [Levofloxacin] Hives and Other (See Comments)    Tingling sensation to mouth, near LOC, blurred vision,    . Eggs Or Egg-Derived Products     Unknown reaction   . Iodine     Unknown reaction   . Levofloxacin In D5w Hives    Hives  . Metformin And Related Diarrhea  . Shellfish Allergy     Unknown reaction   . Vitamin D Analogs Diarrhea  High dose Vitamin D caused diarrhea  . Amoxicillin Rash  . Kombiglyze [Saxagliptin-Metformin Er] Other (See Comments)    Bloating  . Latex Itching and Rash  . Penicillins Rash     Medical History:  Past Medical History:  Diagnosis Date  . Anxiety   . Diabetes mellitus without complication (HCC)   . Hx of colonic polyps    Fhx Colon cancer  . Hyperlipidemia   . Hypocholesteremia   . Tobacco abuse    Family history- Reviewed and unchanged Social history- Reviewed and unchanged   Review of Systems:  Review of Systems  Constitutional: Negative for malaise/fatigue and weight loss.  HENT: Negative for hearing loss, sore throat and tinnitus.   Eyes: Negative for blurred vision and double vision.  Respiratory: Negative for cough, sputum production, shortness of breath and wheezing.   Cardiovascular: Negative for chest pain,  palpitations, orthopnea, claudication and leg swelling.  Gastrointestinal: Negative for abdominal pain, blood in stool, constipation, diarrhea, heartburn, melena, nausea and vomiting.  Genitourinary: Negative.   Musculoskeletal: Negative for joint pain, myalgias and neck pain.  Skin: Negative for rash.  Neurological: Negative for dizziness, tingling, sensory change, focal weakness, weakness and headaches.  Endo/Heme/Allergies: Negative for polydipsia.  Psychiatric/Behavioral: Negative for depression and substance abuse. The patient is not nervous/anxious and does not have insomnia.   All other systems reviewed and are negative.   Physical Exam: BP 110/72   Pulse 81   Temp (!) 97.3 F (36.3 C)   Ht 5' 2.8" (1.595 m)   Wt 178 lb (80.7 kg)   SpO2 97%   BMI 31.73 kg/m  Wt Readings from Last 3 Encounters:  09/16/18 178 lb (80.7 kg)  08/16/18 176 lb (79.8 kg)  05/21/18 181 lb (82.1 kg)   General Appearance: Well nourished, in no apparent distress. Eyes: PERRLA, EOMs, conjunctiva no swelling or erythema Sinuses: No Frontal/maxillary tenderness ENT/Mouth: Ext aud canals clear, TMs without erythema, bulging. No erythema, swelling, or exudate on post pharynx.  Tonsils not swollen or erythematous. Hearing normal.  Neck: Supple, thyroid normal.  Respiratory: Respiratory effort normal, BS equal bilaterally without rales, rhonchi, wheezing or stridor.  Cardio: RRR with no MRGs. Brisk peripheral pulses without edema.  Abdomen: Soft, + BS.  Non tender, no guarding, rebound, hernias, masses. Lymphatics: Non tender without lymphadenopathy.  Musculoskeletal: Full active ROM intact, upper extremity strength somewhat limited by pain with internal rotation of shoulder, with triceps flexion and with internal shoulder rotation, reported palpable lump is not present though with tenderness over lateral triceps. Normal gait.  Skin: Warm, dry without rashes, lesions, ecchymosis.  Neuro: Cranial nerves  intact. No cerebellar symptoms. Sensation intact Psych: Awake and oriented X 3, normal affect, Insight and Judgment appropriate.    Dan Maker, NP 12:37 PM [facility]

## 2018-09-16 ENCOUNTER — Encounter: Payer: Self-pay | Admitting: Adult Health

## 2018-09-16 ENCOUNTER — Ambulatory Visit: Payer: 59 | Admitting: Adult Health

## 2018-09-16 VITALS — BP 110/72 | HR 81 | Temp 97.3°F | Ht 62.8 in | Wt 178.0 lb

## 2018-09-16 DIAGNOSIS — M779 Enthesopathy, unspecified: Secondary | ICD-10-CM

## 2018-09-16 DIAGNOSIS — E785 Hyperlipidemia, unspecified: Secondary | ICD-10-CM

## 2018-09-16 DIAGNOSIS — E66811 Obesity, class 1: Secondary | ICD-10-CM

## 2018-09-16 DIAGNOSIS — M778 Other enthesopathies, not elsewhere classified: Secondary | ICD-10-CM

## 2018-09-16 DIAGNOSIS — E559 Vitamin D deficiency, unspecified: Secondary | ICD-10-CM | POA: Diagnosis not present

## 2018-09-16 DIAGNOSIS — E669 Obesity, unspecified: Secondary | ICD-10-CM

## 2018-09-16 DIAGNOSIS — Z79899 Other long term (current) drug therapy: Secondary | ICD-10-CM

## 2018-09-16 DIAGNOSIS — E1169 Type 2 diabetes mellitus with other specified complication: Secondary | ICD-10-CM | POA: Diagnosis not present

## 2018-09-16 DIAGNOSIS — F411 Generalized anxiety disorder: Secondary | ICD-10-CM

## 2018-09-16 NOTE — Patient Instructions (Addendum)
Goals    . Exercise 150 min/wk Moderate Activity    . Fasting Blood Glucose <150    . HEMOGLOBIN A1C < 7.0    . LDL CALC < 70    . Weight (lb) < 170 lb (77.1 kg)        Try applying wet heat, use mobic daily for 2-4 weeks, can try gentle range of motion and rehab exercises attached below if starting to improve. If sudden/severe pain, lump getting worse, weakness in arm or not improving, please let me know and we can send you to ortho to get checked out.      Triceps Tendinitis Triceps tendinitis is inflammation of the triceps tendon, which is located behind the elbow. The triceps tendon is a strong cord of tissue that connects the triceps muscle, on the back of the upper arm, to a bone in the elbow (ulna). The triceps muscle helps to bend and straighten the elbow. Triceps tendinitis can interfere with your ability to do both of these movements. This condition is usually caused by overuse of the triceps muscle or injury to the upper arm. In most cases, triceps tendinitis heals within 6 weeks. Triceps tendinitis may include a grade 1 or grade 2 strain of the tendon. A grade 1 strain is mild, and it involves a slight pull of the tendon without any stretching or noticeable tearing of the tendon. There is usually no loss of triceps muscle strength. A grade 2 strain is moderate, and it involves a small tear in the tendon. The tendon is stretched, and triceps strength is usually decreased. What are the causes? This condition may be caused by:  Overuse of the triceps muscle.  A direct, forceful hit or injury (trauma) to the triceps tendon.  What increases the risk? This condition is more likely to develop in:  People who participate in sports or activities that require sudden tightening (contraction) of the triceps muscle, such as biking or motorcycle riding.  People who participate in sports that involve moving the arm against resistance, such as weight lifting or bodybuilding.  People who  have poor strength and flexibility of the arm and shoulder.  People who use steroids.  What are the signs or symptoms? Symptoms of this condition may include:  Pain and inflammation in the back of the elbow and the back of the upper arm. Pain may get worse when you try to straighten the elbow.  Bruising (contusion) in the back of the elbow and the back of the upper arm. This may develop 24-48 hours after trauma, if this applies.  Decreased triceps muscle strength, especially when straightening the elbow or gripping objects.  A crackling sound (crepitation) when you move or touch the elbow or the upper arm.  In some cases, symptoms may return (recur) after treatment, and they may be long-lasting (chronic). How is this diagnosed? This condition is diagnosed based on your symptoms, your medical history, and a physical exam. You may have tests, including X-rays or MRIs. Your health care provider may test your range of motion by asking you to do arm movements. How is this treated? This condition is treated by resting and icing the injured area, and by doing physical therapy exercises. Depending on the severity of your condition, treatment may also include:  Over-the-counter or prescription medicines that help to relieve pain and inflammation.  Keeping the elbow in place for a period of time (immobilization). This may be done by wearing a brace or a sling on  your elbow. In rare cases, an elbow cast may be needed.  Surgery to repair (reconstruct) the triceps tendon. This is rare.  Follow these instructions at home: If you have a brace or sling:  Wear it as told by your health care provider. Remove it only as told by your health care provider.  Loosen the brace or sling if your fingers tingle, become numb, or turn cold and blue.  Do not let your brace or sling get wet if it is not waterproof.  Keep the brace or sling clean. If you have a cast:  Do not stick anything inside the cast to  scratch your skin. Doing that increases your risk of infection.  Check the skin around the cast every day. Report any concerns to your health care provider.  You may put lotion on dry skin around the edges of the cast. Do not apply lotion to the skin underneath the cast.  Do not let your cast get wet if it is not waterproof.  Keep the cast clean. Bathing  If you have a cast, brace, or sling, do not take baths, swim, or use a hot tub until your health care provider approves. Ask your health care provider if you can take showers. You may only be allowed to take sponge baths for bathing.  If you have a cast, brace, or sling that is not waterproof, cover it with a watertight covering when you take a bath or a shower. Managing pain, stiffness, and swelling   If directed, apply ice to the injured area: ? Put ice in a plastic bag. ? Place a towel between your skin and the bag. ? Leave the ice on for 20 minutes, 2-3 times a day.  Move your fingers often to avoid stiffness and to lessen swelling.  Raise (elevate) the injured area above the level of your heart while you are sitting or lying down.  If directed, apply heat to the affected area before you exercise. Use the heat source that your health care provider recommends, such as a moist heat pack or a heating pad. ? Place a towel between your skin and the heat source. ? Leave the heat on for 20-30 minutes. ? Remove the heat if your skin turns bright red. This is especially important if you are unable to feel pain, heat, or cold. You may have a greater risk of getting burned. Driving  Do not drive or operate heavy machinery while taking prescription pain medicines.  Ask your health care provider when it is safe to drive if you have a cast, brace, or sling on your arm. Activity  Return to your normal activities as told by your health care provider. Ask your health care provider what activities are safe for you.  Do not lift anything  that is heavier than 10 lb (4.5 kg) until your health care provider tells you that it is safe.  Avoid activities that cause pain or make your condition worse.  Do exercises as told by your health care provider. General instructions  If you have a cast, do not put pressure on any part of the cast until it is fully hardened. This may take several hours.  Take over-the-counter and prescription medicines only as told by your health care provider.  Keep all follow-up visits as told by your health care provider. This is important. How is this prevented?  Warm up and stretch before being active.  Cool down and stretch after being active.  Give your body  time to rest between periods of activity.  Make sure to use equipment that fits you. Wear a brace or tape your arm when playing contact sports, as told by your health care provider.  Be safe and responsible while being active to avoid falls.  Do at least 150 minutes of moderate-intensity exercise each week, such as brisk walking or water aerobics.  Maintain physical fitness, including: ? Strength. ? Flexibility. ? Cardiovascular fitness. ? Endurance. Contact a health care provider if:  You have symptoms that get worse or do not get better after 2 weeks of treatment.  You develop new symptoms. Get help right away if:  You develop severe pain.  You develop numbness or tingling in your hand.  Your hand feels unusually cold.  Your fingernails turn a dark color, such as blue or gray. This information is not intended to replace advice given to you by your health care provider. Make sure you discuss any questions you have with your health care provider. Document Released: 09/29/2005 Document Revised: 06/05/2016 Document Reviewed: 09/07/2015 Elsevier Interactive Patient Education  2018 [employer]   Triceps Tendinitis Rehab Ask your health care provider which exercises are safe for you. Do exercises exactly as told by your  health care provider and adjust them as directed. It is normal to feel mild stretching, pulling, tightness, or discomfort as you do these exercises, but you should stop right away if you feel sudden pain or your pain gets worse. Do not begin these exercises until told by your health care provider. Stretching and range of motion exercises These exercises warm up your muscles and joints and improve the movement and flexibility of your arm. These exercises can also help to relieve pain, numbness, and tingling. Exercise A: Elbow flexion, passive  1. Stand or sit with your left / right arm at your side. 2. Gently push your left / right arm toward your shoulder using your other hand. Bend your elbow as far as your health care provider tells you to. You may be instructed to try to bend your elbow more and more each week. 3. Hold for __________ seconds. 4. Slowly return to the starting position. Repeat __________ times. Complete this exercise __________ times a day. Exercise B: Elbow extension, passive, supine  1. Lie on your back on a firm surface. 2. Place a folded towel under your left / right upper arm, so your elbow and shoulder are at the same height and your elbow does not touch the surface that you are lying on. 3. Move your left / right arm out to your side, keeping your elbow bent. 4. Slowly straighten your elbow until you feel a stretch on the inside of your elbow. Keep your arm and chest muscles relaxed. 5. Hold for __________ seconds. 6. Slowly return to the starting position. Repeat __________ times. Complete this exercise __________ times a day. Strengthening exercises These exercises build strength and endurance in your arm. Endurance is the ability to use your muscles for a long time, even after they get tired. Exercise C: Elbow flexion, supinated  1. Sit on a stable chair without armrests, or stand. 2. Hold a __________ weight in your left / right hand, or hold an exercise band with  both hands. You palms should face up toward the ceiling at the starting position. 3. Bend your left / right elbow and move your hand up toward your shoulder. Keep your other arm straight down, in the starting position. 4. Slowly return to the starting  position. Repeat __________ times. Complete this exercise __________ times a day. Exercise D: Elbow extension  1. Lie on your back. 2. If instructed, hold a __________ weight in your left / right hand. 3. Bend your left / right elbow to an "L" shape (90 degrees) so your elbow is pointed up toward the ceiling and the weight is over your head. 4. Straighten your left / right elbow, raising your hand toward the ceiling. Use your other hand to support your left / right upper arm and to keep it still. 5. Slowly return to the starting position. Repeat __________ times. Complete this exercise __________ times a day. Exercise E: Scapular retraction  1. Sit in a stable chair without arms, or stand. 2. Secure an exercise band to a stable object in front of you so the band is at shoulder height. 3. Hold one end of the exercise band in each hand. 4. Squeeze your shoulder blades together and move your elbows slightly behind you. Do not shrug your shoulders. 5. Hold for __________ seconds. 6. Slowly return to the starting position. Repeat __________ times. Complete this exercise __________ times a day. This information is not intended to replace advice given to you by your health care provider. Make sure you discuss any questions you have with your health care provider. Document Released: 09/29/2005 Document Revised: 06/05/2016 Document Reviewed: 09/07/2015 Elsevier Interactive Patient Education  [employer].

## 2018-09-17 LAB — LIPID PANEL
Cholesterol: 244 mg/dL — ABNORMAL HIGH (ref <200)
HDL: 40 mg/dL — ABNORMAL LOW (ref >50)
LDL Cholesterol (Calc): 154 mg/dL (calc) — ABNORMAL HIGH
Non-HDL Cholesterol (Calc): 204 mg/dL (calc) — ABNORMAL HIGH (ref <130)
TRIGLYCERIDES: 328 mg/dL — AB (ref <150)
Total CHOL/HDL Ratio: 6.1 (calc) — ABNORMAL HIGH (ref <5.0)

## 2018-09-17 LAB — CBC WITH DIFFERENTIAL/PLATELET
BASOS ABS: 30 {cells}/uL (ref 0–200)
Basophils Relative: 0.4 %
EOS ABS: 83 {cells}/uL (ref 15–500)
Eosinophils Relative: 1.1 %
HCT: 47.8 % — ABNORMAL HIGH (ref 35.0–45.0)
HEMOGLOBIN: 16.6 g/dL — AB (ref 11.7–15.5)
Lymphs Abs: 1793 cells/uL (ref 850–3900)
MCH: 30.9 pg (ref 27.0–33.0)
MCHC: 34.7 g/dL (ref 32.0–36.0)
MCV: 89 fL (ref 80.0–100.0)
MONOS PCT: 6.9 %
MPV: 10.9 fL (ref 7.5–12.5)
NEUTROS ABS: 5078 {cells}/uL (ref 1500–7800)
Neutrophils Relative %: 67.7 %
Platelets: 251 10*3/uL (ref 140–400)
RBC: 5.37 10*6/uL — ABNORMAL HIGH (ref 3.80–5.10)
RDW: 12.5 % (ref 11.0–15.0)
TOTAL LYMPHOCYTE: 23.9 %
WBC mixed population: 518 cells/uL (ref 200–950)
WBC: 7.5 10*3/uL (ref 3.8–10.8)

## 2018-09-17 LAB — COMPLETE METABOLIC PANEL WITH GFR
AG Ratio: 1.6 (calc) (ref 1.0–2.5)
ALBUMIN MSPROF: 4.6 g/dL (ref 3.6–5.1)
ALKALINE PHOSPHATASE (APISO): 109 U/L (ref 33–130)
ALT: 16 U/L (ref 6–29)
AST: 14 U/L (ref 10–35)
BILIRUBIN TOTAL: 0.6 mg/dL (ref 0.2–1.2)
BUN: 15 mg/dL (ref 7–25)
CHLORIDE: 102 mmol/L (ref 98–110)
CO2: 25 mmol/L (ref 20–32)
Calcium: 10.1 mg/dL (ref 8.6–10.4)
Creat: 0.72 mg/dL (ref 0.50–0.99)
GFR, Est African American: 105 mL/min/{1.73_m2} (ref >=60)
GFR, Est Non African American: 91 mL/min/{1.73_m2} (ref >=60)
GLUCOSE: 160 mg/dL — AB (ref 65–99)
Globulin: 2.8 g/dL (calc) (ref 1.9–3.7)
POTASSIUM: 4.8 mmol/L (ref 3.5–5.3)
SODIUM: 138 mmol/L (ref 135–146)
Total Protein: 7.4 g/dL (ref 6.1–8.1)

## 2018-09-17 LAB — VITAMIN D 25 HYDROXY (VIT D DEFICIENCY, FRACTURES): Vit D, 25-Hydroxy: 13 ng/mL — ABNORMAL LOW (ref 30–100)

## 2018-09-17 LAB — MAGNESIUM: MAGNESIUM: 2.2 mg/dL (ref 1.5–2.5)

## 2018-09-17 LAB — TSH: TSH: 0.86 m[IU]/L (ref 0.40–4.50)

## 2018-09-20 ENCOUNTER — Ambulatory Visit: Payer: 59 | Admitting: Internal Medicine

## 2018-09-20 ENCOUNTER — Encounter: Payer: Self-pay | Admitting: Internal Medicine

## 2018-09-20 VITALS — BP 110/80 | HR 64 | Temp 98.7°F | Ht 62.5 in | Wt 175.0 lb

## 2018-09-20 DIAGNOSIS — J22 Unspecified acute lower respiratory infection: Secondary | ICD-10-CM

## 2018-09-20 DIAGNOSIS — J9801 Acute bronchospasm: Secondary | ICD-10-CM

## 2018-09-20 DIAGNOSIS — R52 Pain, unspecified: Secondary | ICD-10-CM | POA: Diagnosis not present

## 2018-09-20 DIAGNOSIS — J029 Acute pharyngitis, unspecified: Secondary | ICD-10-CM

## 2018-09-20 DIAGNOSIS — K5792 Diverticulitis of intestine, part unspecified, without perforation or abscess without bleeding: Secondary | ICD-10-CM | POA: Diagnosis not present

## 2018-09-20 LAB — POCT INFLUENZA A/B
Influenza A, POC: NEGATIVE
Influenza B, POC: NEGATIVE

## 2018-09-20 MED ORDER — BUDESONIDE-FORMOTEROL FUMARATE 160-4.5 MCG/ACT IN AERO: 2.0000 | INHALATION_SPRAY | Freq: Two times a day (BID) | RESPIRATORY_TRACT | 3 refills | Status: DC

## 2018-09-20 MED ORDER — DOXYCYCLINE MONOHYDRATE 100 MG PO CAPS: ORAL_CAPSULE | ORAL | 0 refills | Status: DC

## 2018-09-20 MED ORDER — GLYCOPYRROLATE-FORMOTEROL 9-4.8 MCG/ACT IN AERO: 1.0000 | INHALATION_SPRAY | Freq: Two times a day (BID) | RESPIRATORY_TRACT | 2 refills | Status: DC

## 2018-09-20 MED ORDER — CEFTRIAXONE SODIUM 1 G IJ SOLR
1.0000 g | Freq: Once | INTRAMUSCULAR | Status: AC
  Administered 2018-09-20: 1 g via INTRAMUSCULAR

## 2018-09-20 NOTE — Patient Instructions (Addendum)
Doxycycline 100 mg twice daily for 10 days. Rocephin one gram IM. Bevespi inhaler 2 times daily. Symbicort inhaler 2 sprays po q 12 hours.

## 2018-09-20 NOTE — Progress Notes (Signed)
   Subjective:    Patient ID: Candice [name] PatientJoni D [name], female    DOB: [birth year], 62 y.o.   MRN: [id number]  HPI  62 year old Female  with cough, myalgias, sore throat and wheezing. Has not has flu vaccine this year.  Has malaise and fatigue.  She is concerned she may have the flu.  Cough is productive and deep.  Patient has diabetes mellitus.    Review of Systems no nausea vomiting or diarrhea     Objective:   Physical Exam Skin pale warm and dry.  No anterior cervical nodes.  Pharynx is injected without exudate.  TMs slightly full.  Neck is supple.  Patient has rhonchi and wheezing bilaterally.         Assessment & Plan:  Acute lower respiratory infection  Acute bronchospasm  Plan: Symbicort 2 puffs every 12 hours.  Doxycycline 100 mg twice daily for 10 days.  Bevespi inhaler 1 sprays p.o. twice daily.  Rest and drink plenty of fluids.  Rocephin 1 g IM.

## 2018-10-08 ENCOUNTER — Other Ambulatory Visit: Payer: Self-pay

## 2018-10-08 MED ORDER — MELOXICAM 15 MG PO TABS: ORAL_TABLET | ORAL | 1 refills | Status: DC

## 2018-10-10 ENCOUNTER — Encounter: Payer: Self-pay | Admitting: Internal Medicine

## 2018-11-26 ENCOUNTER — Ambulatory Visit: Payer: 59 | Admitting: Internal Medicine

## 2018-11-26 ENCOUNTER — Encounter: Payer: Self-pay | Admitting: Internal Medicine

## 2018-11-26 VITALS — BP 110/70 | HR 68 | Ht 62.8 in | Wt 178.0 lb

## 2018-11-26 DIAGNOSIS — E1165 Type 2 diabetes mellitus with hyperglycemia: Secondary | ICD-10-CM | POA: Diagnosis not present

## 2018-11-26 DIAGNOSIS — E1169 Type 2 diabetes mellitus with other specified complication: Secondary | ICD-10-CM | POA: Diagnosis not present

## 2018-11-26 DIAGNOSIS — B373 Candidiasis of vulva and vagina: Secondary | ICD-10-CM | POA: Diagnosis not present

## 2018-11-26 DIAGNOSIS — B3731 Acute candidiasis of vulva and vagina: Secondary | ICD-10-CM

## 2018-11-26 DIAGNOSIS — E785 Hyperlipidemia, unspecified: Secondary | ICD-10-CM | POA: Diagnosis not present

## 2018-11-26 LAB — POCT GLYCOSYLATED HEMOGLOBIN (HGB A1C): HEMOGLOBIN A1C: 9.2 % — AB (ref 4.0–5.6)

## 2018-11-26 MED ORDER — GLIPIZIDE 5 MG PO TABS: 5.0000 mg | ORAL_TABLET | Freq: Two times a day (BID) | ORAL | 3 refills | Status: DC

## 2018-11-26 NOTE — Patient Instructions (Addendum)
Please continue: - [name] 25 mg before breakfast - [name] 1.5 mg weekly.  Please increase: - Glipizide 5 mg before b'fast and dinner  Start checking sugars 1x a day, rotating check times.  Please return in 3 months with your sugar log.

## 2018-11-26 NOTE — Addendum Note (Signed)
Addended by: Darliss Ridgel I on: 11/26/2018 04:18 PM   Modules accepted: Orders

## 2018-11-26 NOTE — Progress Notes (Signed)
Patient ID: Candice [name], female   DOB: 05-Feb-1956, 63 y.o.   MRN: [id number]   HPI: Candice [name] is a 63 y.o.-year-old female, returning for follow-up for DM2, dx in 2012-2013, non-insulin-dependent, uncontrolled, without long term complications.  Last visit 3 months ago. PCP: Judd Gaudier PA  She is very stressed this month as she was forced to move her workplace to another office.  She is not checking sugars and missed [name] for 2 weeks recently.  Last hemoglobin A1c was: Lab Results  Component Value Date   HGBA1C 7.7 (A) 08/16/2018   HGBA1C 9.5 (A) 03/12/2018   HGBA1C 9.8 (H) 10/15/2017   Pt is on a regimen of: - Glipizide 5 mg before dinner - [name] 25 mg before breakfast - [name] 1.5 mg weekly -started 02/2018 - skipped 2 weeks We retried Metformin ER 500 mg daily with dinner in 08/2018-she had nausea and AP and had to stop Used [place] in the past >> not covered anymore. No yeast inf's. She was on Glipizide 5 mg 2x a day before meals She was on Victoza >> tolerated it well.  She is not checking sugars. From last visit: - am: n/c >> 110, 150-165, 200 - 2h after b'fast: 220 >> n/c - before lunch: n/c - 2h after lunch: n/c - before dinner: n/c - 2h after dinner: n/c - bedtime: n/c >> 180-190s - nighttime: n/c Lowest sugar was 110 >> ?; it is unclear at which level she has had progress Highest sugar was 285 >> 200 >> ?Marland Kitchen  Glucometer: none  Pt's meals are: - Breakfast: PB crackers, fruit, sausage McMuffin at 10 am - Lunch: chicken salad wraps, sandwiches, baked chips - Dinner: veggies, meats, salad - Snacks: crackers  -No CKD, last BUN/creatinine:  Lab Results  Component Value Date   BUN 15 09/16/2018   BUN 12 05/19/2018   CREATININE 0.72 09/16/2018   CREATININE 0.77 05/19/2018   -+ HL; last set of lipids: Lab Results  Component Value Date   CHOL 244 (H) 09/16/2018   HDL 40 (L) 09/16/2018   LDLCALC 154 (H) 09/16/2018   TRIG 328 (H)  09/16/2018   CHOLHDL 6.1 (H) 09/16/2018  On Crestor 20.  - last eye exam was in 02/2018: No DR, + cataract  -Denies numbness and tingling in her feet.  On ASA 81.  Pt has FH of DM in sister.  ROS: Constitutional: no weight gain/no weight loss, no fatigue, no subjective hyperthermia, no subjective hypothermia Eyes: no blurry vision, no xerophthalmia ENT: no sore throat, no nodules palpated in neck, no dysphagia, no odynophagia, no hoarseness Cardiovascular: no CP/no SOB/no palpitations/no leg swelling Respiratory: no cough/no SOB/no wheezing Gastrointestinal: no N/no V/no D/no C/no acid reflux Musculoskeletal: no muscle aches/+ joint aches-shoulder bursitis Skin: no rashes, no hair loss Neurological: no tremors/no numbness/no tingling/no dizziness  I reviewed pt's medications, allergies, PMH, social hx, family hx, and changes were documented in the history of present illness. Otherwise, unchanged from my initial visit note.  Past Medical History:  Diagnosis Date  . Anxiety   . Diabetes mellitus without complication (HCC)   . Hx of colonic polyps    Fhx Colon cancer  . Hyperlipidemia   . Hypocholesteremia   . Tobacco abuse    Past Surgical History:  Procedure Laterality Date  . APPENDECTOMY    . CESAREAN SECTION    . CHOLECYSTECTOMY    . TONSILLECTOMY     Social History   Socioeconomic History  . Marital  status: Married    Spouse name: Not on file  . Number of children: 1  . Years of education: Not on file  . Highest education level: Not on file  Occupational History  .  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 Financial resource strain: Not on file  . Food insecurity:    Worry: Not on file    Inability: Not on file  . Transportation needs:    Medical: Not on file    Non-medical: Not on file  Tobacco Use  . Smoking status: Former Smoker    Packs/day: 0.50    Years: 30.00    Pack years: 15.00    Types: Cigarettes    Last attempt to quit:  10/15/2017    Years since quitting: 0.4  . Smokeless tobacco: Never Used  Substance and Sexual Activity  . Alcohol use: No    Alcohol/week: 0.0 oz  . Drug use: No   Current Outpatient Medications on File Prior to Visit  Medication Sig Dispense Refill  . aspirin EC 81 MG tablet Take 81 mg by mouth daily.    . budesonide-formoterol (SYMBICORT) 160-4.5 MCG/ACT inhaler Inhale 2 puffs into the lungs 2 (two) times daily. 1 Inhaler 3  . chlorpheniramine (CHLOR-TRIMETON) 4 MG tablet Take 2 tablets (8 mg total) by mouth 3 (three) times daily. 190 tablet 0  . Cholecalciferol (VITAMIN D3) 2000 units TABS Take 4,000 Units by mouth.    . citalopram (CELEXA) 40 MG tablet Take 1 tab daily by mouth for mood. 90 tablet 1  . doxycycline (MONODOX) 100 MG capsule Take 1 capsule 2 x /day for Diverticulitis 20 capsule 0  . Dulaglutide ([name]) 1.5 MG/0.5ML SOPN Inject 1.5 mg into the skin once a week. 12 pen 3  . empagliflozin ([name]) 25 MG TABS tablet Take 25 mg by mouth daily. 90 tablet 3  . glipiZIDE (GLUCOTROL) 5 MG tablet Take 0.5 tablets (2.5 mg total) by mouth 2 (two) times daily before a meal. (Patient taking differently: Take 5 mg by mouth as needed. ) 90 tablet 3  . glucose blood (ONETOUCH VERIO) test strip Use 2x a day 200 each 3  . Glycopyrrolate-Formoterol (BEVESPI AEROSPHERE) 9-4.8 MCG/ACT AERO Inhale 1 puff into the lungs 2 (two) times daily. 5.9 g 2  . meclizine (ANTIVERT) 25 MG tablet 1/2-1 pill up to 3 times daily for motion sickness/dizziness (Patient taking differently: as needed. 1/2-1 pill up to 3 times daily for motion sickness/dizziness) 30 tablet 0  . meloxicam (MOBIC) 15 MG tablet Take one daily with food for 2 weeks, can take with tylenol, can not take with aleve, iburpofen, then as needed daily for pain 30 tablet 1  . metFORMIN (GLUCOPHAGE-XR) 500 MG 24 hr tablet Take 1 to 2 tablets with a meal 2 x /day for Diabetes (Patient taking differently: daily with lunch. Take 1 to 2 tablets  with a meal 2 x /day for Diabetes) 360 tablet 0  . montelukast (SINGULAIR) 10 MG tablet Take 1 tablet (10 mg total) by mouth at bedtime. 90 tablet 3  . ONETOUCH DELICA LANCETS 33G MISC Check 2x a day 200 each 3  . rosuvastatin (CRESTOR) 40 MG tablet Take 1/2-1 tab as directed daily in the evening for cholesterol. 90 tablet 1  . terconazole (TERAZOL 7) 0.4 % vaginal cream Place 1 applicator vaginally at bedtime. 45 g 2   No current facility-administered medications on file prior to visit.    Allergies  Allergen Reactions  .  Levaquin [Levofloxacin] Hives and Other (See Comments)    Tingling sensation to mouth, near LOC, blurred vision,    . Eggs Or Egg-Derived Products     Unknown reaction   . Iodine     Unknown reaction   . Levofloxacin In D5w Hives    Hives  . Metformin And Related Diarrhea  . Shellfish Allergy     Unknown reaction   . Vitamin D Analogs Diarrhea    High dose Vitamin D caused diarrhea  . Amoxicillin Rash  . Kombiglyze [Saxagliptin-Metformin Er] Other (See Comments)    Bloating  . Latex Itching and Rash  . Penicillins Rash   Family History  Problem Relation Age of Onset  . AAA (abdominal aortic aneurysm) Mother   . Hyperlipidemia Mother   . Heart attack Brother 55  . Pneumonia Maternal Grandmother   . Cancer Sister        Colon  . Diabetes Sister   . Heart disease Sister     PE: BP 110/70   Pulse 68   Ht 5' 2.8" (1.595 m)   Wt 178 lb (80.7 kg)   SpO2 98%   BMI 31.73 kg/m  Wt Readings from Last 3 Encounters:  11/26/18 178 lb (80.7 kg)  09/20/18 175 lb (79.4 kg)  09/16/18 178 lb (80.7 kg)   Constitutional: overweight, in NAD Eyes: PERRLA, EOMI, no exophthalmos ENT: moist mucous membranes, no thyromegaly, no cervical lymphadenopathy Cardiovascular: RRR, No MRG Respiratory: CTA B Gastrointestinal: abdomen soft, NT, ND, BS+ Musculoskeletal: no deformities, strength intact in all 4 Skin: moist, warm, no rashes Neurological: no tremor with  outstretched hands, DTR normal in all 4  ASSESSMENT: 1. DM2, non-insulin-dependent, uncontrolled, without long term complications, but with hyperglycemia  2. Yeast vaginitis  3. HL  PLAN:  1. Patient with longstanding, uncontrolled, type 2 diabetes, on oral antidiabetic regimen, and GLP-1 receptor agonist. -At last visit, sugars were still higher than goal with higher sugars in the morning, occasionally up to 200.  We discussed about restarting a low-dose metformin ER, which she stopped in the past due to abdominal pain and diarrhea.  I advised her to use only 1 tablet a day.  I advised her that if she could not tolerate this, to add a low-dose glipizide, 2.5 mg before breakfast and dinner.  At last visit, HbA1c was 7.7%, improved. -At this visit, she is not checking sugars at all.  I strongly advised her to restart, otherwise, it will be very difficult to control her diabetes and adjust her medications.  She is planning to do so. -She was very stressed in the last month regarding her work, but she is now back on her entire regimen.  She could not tolerate metformin ER even at 1 tablet a day so she started 5 mg of glipizide before dinner.  At this visit, I advised her to increase this to twice a day.  We will continue [name] and [name] for now. -I did suggest to start basal insulin but she would like to avoid this for now and wait until next visit to have a chance to work on her regimen, checking sugars, and her diet. - I suggested to:  Patient Instructions  Please continue: - [name] 25 mg before breakfast - [name] 1.5 mg weekly.  Please increase: -  Glipizide 5 mg before b'fast and dinner  Start checking sugars 1x a day, rotating check times.  Please return in 3 months with your sugar log.    -  today, HbA1c is 9.2% (higher) - continue checking sugars at different times of the day - check 1x a day, rotating checks - advised for yearly eye exams >> she is UTD - Return to  clinic in 3 mo with sugar log    2.  Yeast vaginitis -She had Diflucan courses in the past but also uses terconazole cream: Terconazole Cream 0.4%: - insert 1 applicatorful vaginally at bedtime every night for 1 week, then - insert 1/2 applicatorful at bedtime once weekly indefinitely to maintain effect -No more yeast infections after starting terconazole  3. HL - Reviewed latest lipid panel from 09/2018: All fractions abnormal Lab Results  Component Value Date   CHOL 244 (H) 09/16/2018   HDL 40 (L) 09/16/2018   LDLCALC 154 (H) 09/16/2018   TRIG 328 (H) 09/16/2018   CHOLHDL 6.1 (H) 09/16/2018  - Continues the Crestor without side effects.  Carlus Pavlovristina Courtenay Creger, MD PhD [facility]

## 2018-12-03 ENCOUNTER — Other Ambulatory Visit: Payer: Self-pay | Admitting: Adult Health

## 2018-12-20 ENCOUNTER — Encounter: Payer: Self-pay | Admitting: Adult Health

## 2018-12-25 ENCOUNTER — Other Ambulatory Visit: Payer: Self-pay | Admitting: Adult Health

## 2019-01-06 ENCOUNTER — Encounter: Payer: Self-pay | Admitting: Internal Medicine

## 2019-01-06 ENCOUNTER — Telehealth: Payer: Self-pay | Admitting: Internal Medicine

## 2019-01-06 MED ORDER — ALBUTEROL SULFATE HFA 108 (90 BASE) MCG/ACT IN AERS: 2.0000 | INHALATION_SPRAY | Freq: Four times a day (QID) | RESPIRATORY_TRACT | 2 refills | Status: DC | PRN

## 2019-01-06 NOTE — Telephone Encounter (Signed)
Chest tightness cough with little sputum production. No wheezing but chest heaviness. Call in Albuterol inhaler. Has Symbicort at home and should uses that 2 sprays po q 12 hours as well.

## 2019-02-02 ENCOUNTER — Other Ambulatory Visit: Payer: Self-pay | Admitting: Internal Medicine

## 2019-02-03 ENCOUNTER — Other Ambulatory Visit: Payer: Self-pay

## 2019-02-03 ENCOUNTER — Encounter: Payer: Self-pay | Admitting: Internal Medicine

## 2019-02-03 ENCOUNTER — Ambulatory Visit: Payer: 59 | Admitting: Internal Medicine

## 2019-02-03 VITALS — Temp 97.7°F

## 2019-02-03 DIAGNOSIS — H6501 Acute serous otitis media, right ear: Secondary | ICD-10-CM

## 2019-02-03 DIAGNOSIS — H9201 Otalgia, right ear: Secondary | ICD-10-CM | POA: Diagnosis not present

## 2019-02-03 MED ORDER — METHYLPREDNISOLONE ACETATE 80 MG/ML IJ SUSP
80.0000 mg | Freq: Once | INTRAMUSCULAR | Status: AC
  Administered 2019-02-03: 13:00:00 80 mg via INTRAMUSCULAR

## 2019-02-03 NOTE — Patient Instructions (Signed)
Depo-Medrol 80 mg IM given in office.

## 2019-02-03 NOTE — Progress Notes (Signed)
   Subjective:    [name] ID: Candice [name], female    DOB: November 25, 1955, 63 y.o.   MRN: [id number]  HPI 63 year old Female complaining of right otalgia.  She has diabetes mellitus.  No recent respiratory infection that she is aware of.  No fever or chills.  No significant cough.  No sore throat. Seen here in person in this office today.   Review of Systems see above     Objective:   Physical Exam  She is afebrile.  Right TM is full but not red.  Left TM is normal.  Pharynx is clear.  Neck is supple.      Assessment & Plan:  Acute right serous otitis media  Plan: Depo-Medrol 80 mg IM given in office.

## 2019-03-11 ENCOUNTER — Ambulatory Visit: Payer: 59 | Admitting: Internal Medicine

## 2019-04-10 ENCOUNTER — Other Ambulatory Visit: Payer: Self-pay | Admitting: Internal Medicine

## 2019-04-19 ENCOUNTER — Other Ambulatory Visit: Payer: Self-pay

## 2019-04-19 ENCOUNTER — Ambulatory Visit: Payer: 59 | Admitting: Internal Medicine

## 2019-04-19 VITALS — Temp 99.9°F

## 2019-04-19 DIAGNOSIS — L02234 Carbuncle of groin: Secondary | ICD-10-CM

## 2019-04-19 DIAGNOSIS — R509 Fever, unspecified: Secondary | ICD-10-CM | POA: Diagnosis not present

## 2019-04-19 DIAGNOSIS — R05 Cough: Secondary | ICD-10-CM

## 2019-04-19 DIAGNOSIS — K5792 Diverticulitis of intestine, part unspecified, without perforation or abscess without bleeding: Secondary | ICD-10-CM

## 2019-04-19 DIAGNOSIS — Z20828 Contact with and (suspected) exposure to other viral communicable diseases: Secondary | ICD-10-CM

## 2019-04-19 DIAGNOSIS — Z20822 Contact with and (suspected) exposure to covid-19: Secondary | ICD-10-CM

## 2019-04-19 DIAGNOSIS — R059 Cough, unspecified: Secondary | ICD-10-CM

## 2019-04-19 LAB — CBC WITH DIFFERENTIAL/PLATELET
Absolute Monocytes: 616 cells/uL (ref 200–950)
Basophils Absolute: 68 cells/uL (ref 0–200)
Basophils Relative: 0.9 %
Eosinophils Absolute: 68 cells/uL (ref 15–500)
Eosinophils Relative: 0.9 %
HCT: 47 % — ABNORMAL HIGH (ref 35.0–45.0)
Hemoglobin: 15.9 g/dL — ABNORMAL HIGH (ref 11.7–15.5)
Lymphs Abs: 1763 cells/uL (ref 850–3900)
MCH: 31.5 pg (ref 27.0–33.0)
MCHC: 33.8 g/dL (ref 32.0–36.0)
MCV: 93.3 fL (ref 80.0–100.0)
MPV: 10.5 fL (ref 7.5–12.5)
Monocytes Relative: 8.1 %
Neutro Abs: 5084 cells/uL (ref 1500–7800)
Neutrophils Relative %: 66.9 %
Platelets: 252 10*3/uL (ref 140–400)
RBC: 5.04 10*6/uL (ref 3.80–5.10)
RDW: 12.4 % (ref 11.0–15.0)
Total Lymphocyte: 23.2 %
WBC: 7.6 10*3/uL (ref 3.8–10.8)

## 2019-04-19 MED ORDER — DOXYCYCLINE HYCLATE 100 MG PO TABS: 100.0000 mg | ORAL_TABLET | Freq: Two times a day (BID) | ORAL | 0 refills | Status: DC

## 2019-04-19 MED ORDER — CEFTRIAXONE SODIUM 1 G IJ SOLR
1.0000 g | Freq: Once | INTRAMUSCULAR | Status: AC
  Administered 2019-04-19: 1 g via INTRAMUSCULAR

## 2019-04-19 NOTE — Progress Notes (Signed)
   Subjective:    Patient ID: Candice [name], female    DOB: 04-05-1956, 63 y.o.   MRN: [id number]  HPI Patient tells me she has had low-grade fever.  She is also coughing.  Denies headache shaking chills myalgias.  Has developed apparently a carbuncle in her right pubic area.  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  She did draw blood on the patient who turned out to be COVID positive recently.  But patient was wearing a mask and Candice [name] was wearing a mask and face shield as well as gloves.  She is concerned because she keeps her grandson frequently.  Her daughter is a paramedic at [employer].  No one else at home has fever or other symptoms that are concerning.  Patient is a diabetic.  History of smoking.    Review of Systems see above     Objective:   Physical Exam She has a carbuncle about the size of a quarter in her right pubic area.  It is not draining.  It is erythematous.  Pharynx is clear.   Chest clear to auscultation without rales or wheezing.       Assessment & Plan:  Carbuncle right pubic area which is the likely source of her temperature elevation.  Recent exposure to COVID-19 patient but was wearing protective equipment  History of smoking  Diabetes mellitus  Cough  Plan: Due to recent exposure, cough and fever I am going to go ahead and test her for COVID-19.  I do think the fever is likely coming from the carbuncle.  Was given 1 g IM Rocephin and started on doxycycline 100 mg twice daily for 10 days.

## 2019-04-24 LAB — SARS-COV-2 RNA,(COVID-19) QUALITATIVE NAAT: SARS CoV2 RNA: NOT DETECTED

## 2019-04-27 ENCOUNTER — Encounter: Payer: Self-pay | Admitting: Internal Medicine

## 2019-04-27 NOTE — Patient Instructions (Signed)
COVID-19 testing done.  Doxycycline 100 mg twice daily for 10 days.  Monitor temp at home.  Rocephin 1 g IM given.

## 2019-04-29 IMAGING — CR DG CHEST 2V
2 series · 2 of 2 positions shown · non-contrast
Comparison: Chest x-rays dated 01/29/2015 and 12/15/2012

CLINICAL DATA: Cough and low-grade fevers for 3 weeks. Concern for
pneumonia.

EXAM:
CHEST - 2 VIEW

[w chest pa]
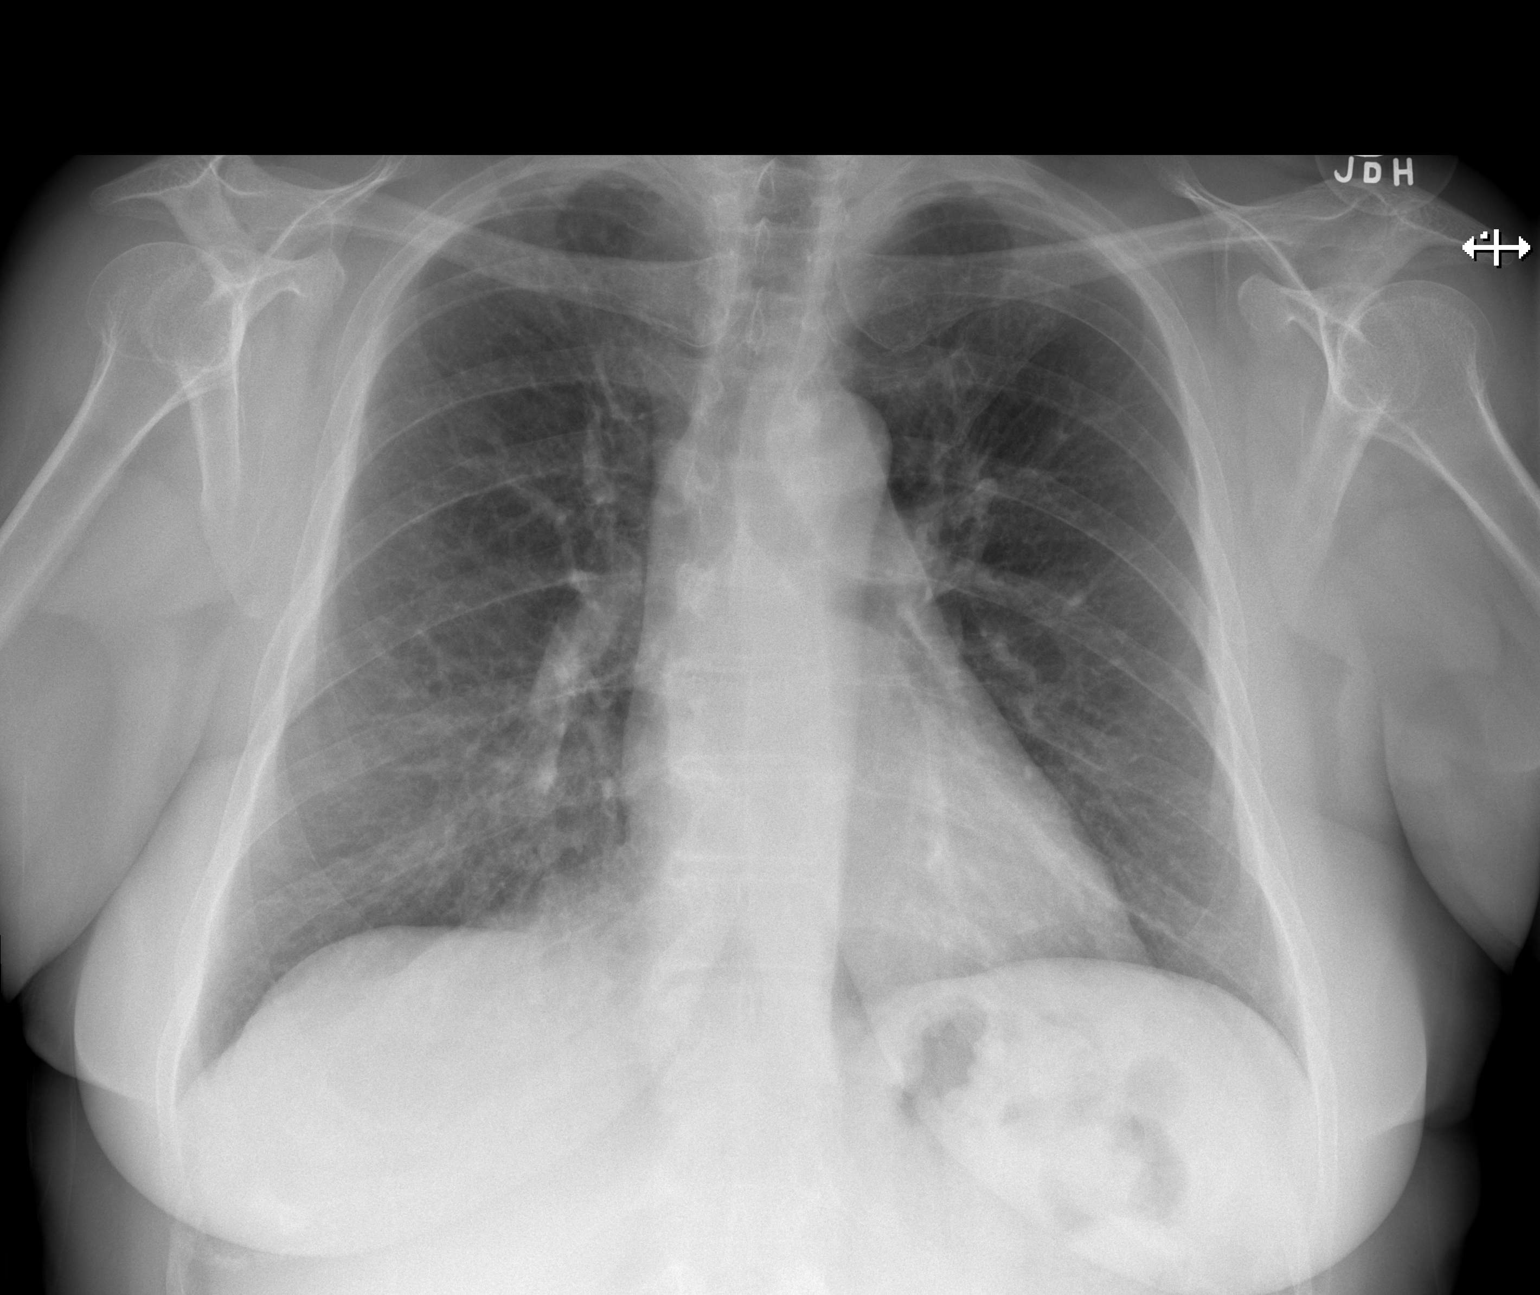

[w chest lat]
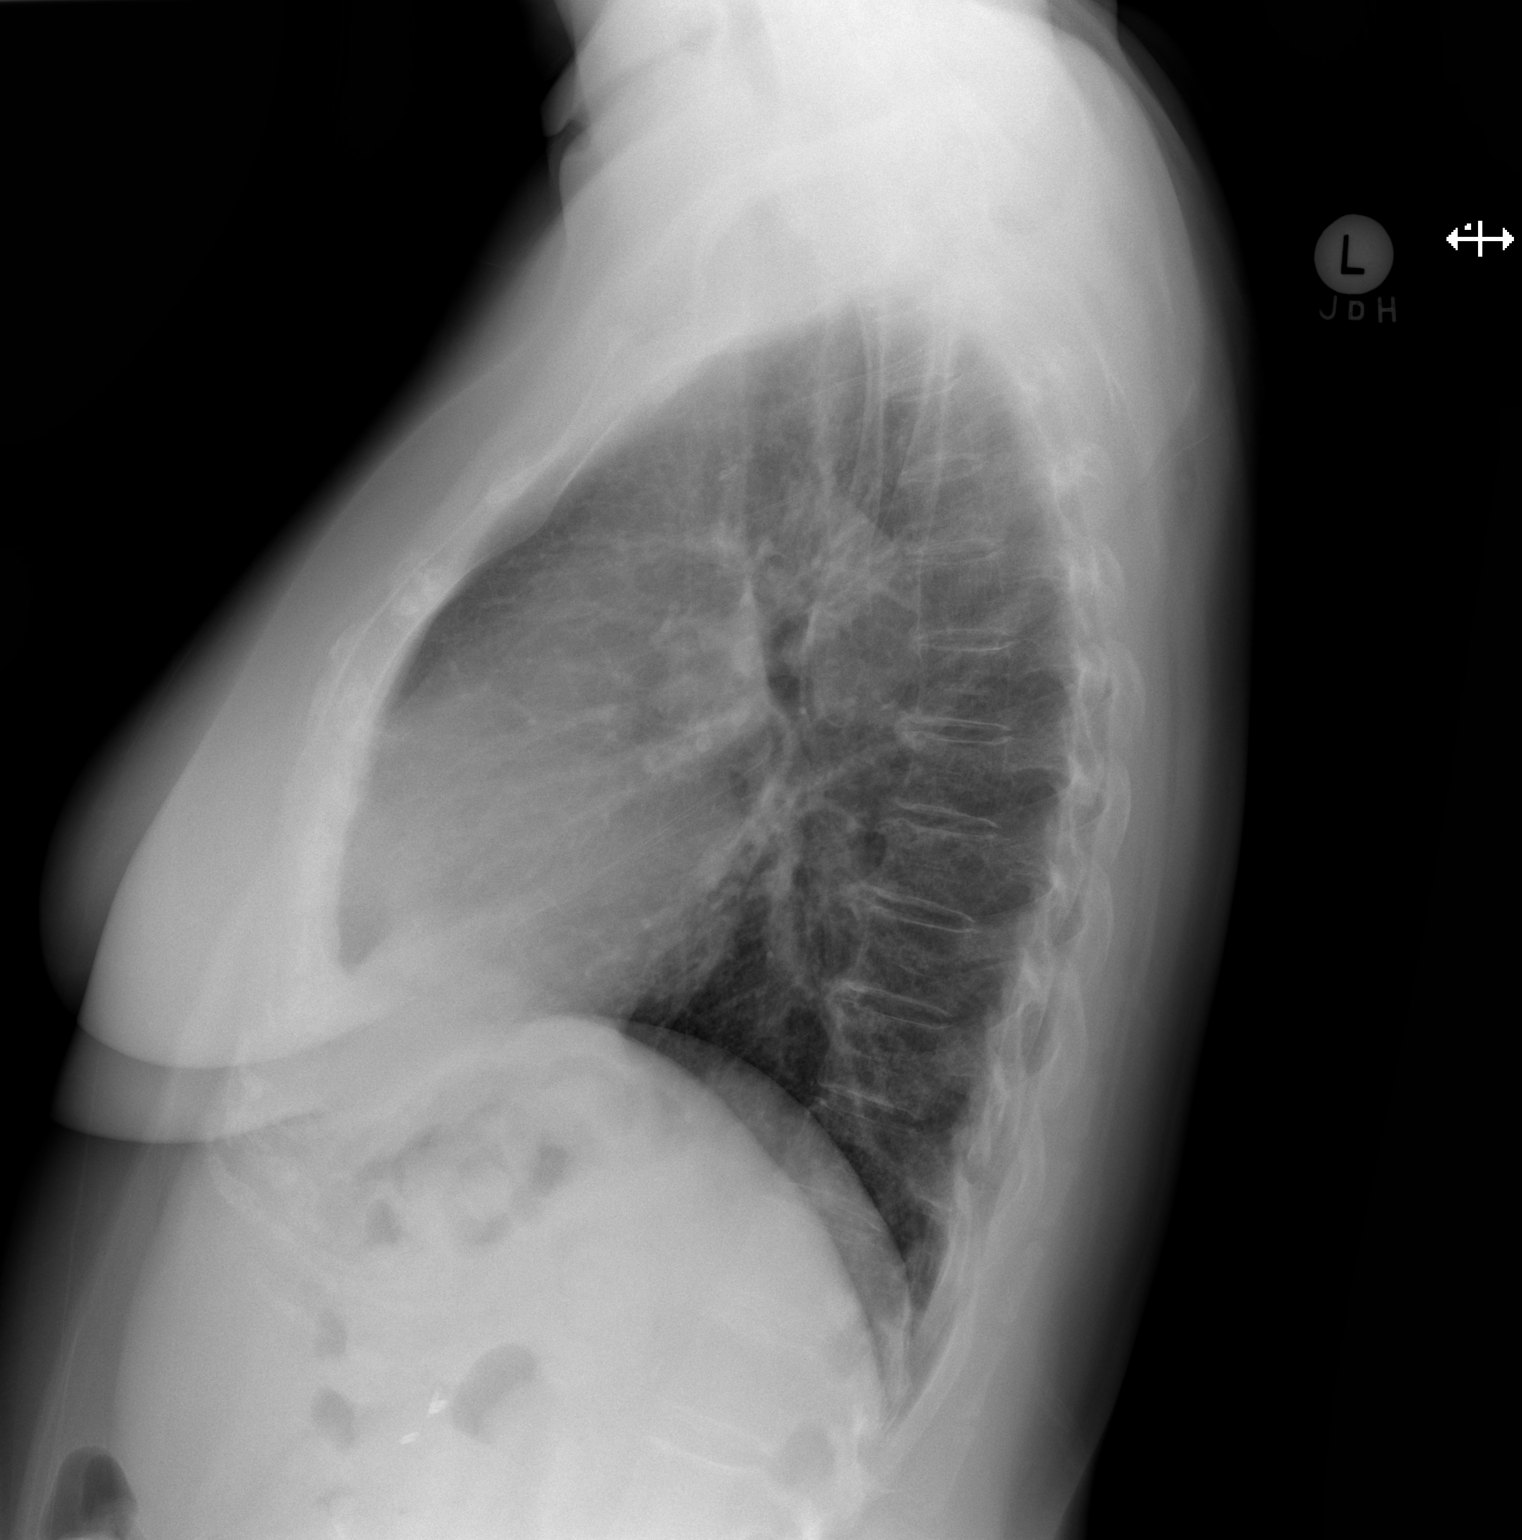

[2 of 2 positions shown; findings below may reference images not displayed]

FINDINGS: Heart size and mediastinal contours are normal. Lungs are clear. No
pleural effusion or pneumothorax seen. No acute or suspicious
osseous finding.
IMPRESSION: No active cardiopulmonary disease.  No evidence of pneumonia.

## 2019-06-24 ENCOUNTER — Encounter: Payer: Self-pay | Admitting: Internal Medicine

## 2019-06-24 ENCOUNTER — Ambulatory Visit (INDEPENDENT_AMBULATORY_CARE_PROVIDER_SITE_OTHER): Payer: 59 | Admitting: Internal Medicine

## 2019-06-24 VITALS — BP 112/70 | HR 88 | Temp 97.8°F

## 2019-06-24 DIAGNOSIS — J9801 Acute bronchospasm: Secondary | ICD-10-CM | POA: Diagnosis not present

## 2019-06-24 DIAGNOSIS — J22 Unspecified acute lower respiratory infection: Secondary | ICD-10-CM

## 2019-06-24 DIAGNOSIS — K5792 Diverticulitis of intestine, part unspecified, without perforation or abscess without bleeding: Secondary | ICD-10-CM

## 2019-06-24 MED ORDER — ALBUTEROL SULFATE HFA 108 (90 BASE) MCG/ACT IN AERS: 2.0000 | INHALATION_SPRAY | Freq: Four times a day (QID) | RESPIRATORY_TRACT | 99 refills | Status: DC | PRN

## 2019-06-24 MED ORDER — DOXYCYCLINE MONOHYDRATE 100 MG PO CAPS: ORAL_CAPSULE | ORAL | 0 refills | Status: DC

## 2019-06-24 MED ORDER — BUDESONIDE-FORMOTEROL FUMARATE 160-4.5 MCG/ACT IN AERO: 2.0000 | INHALATION_SPRAY | Freq: Two times a day (BID) | RESPIRATORY_TRACT | 3 refills | Status: DC

## 2019-06-24 MED ORDER — METHYLPREDNISOLONE ACETATE 80 MG/ML IJ SUSP
80.0000 mg | Freq: Once | INTRAMUSCULAR | Status: AC
  Administered 2019-06-24: 13:00:00 80 mg via INTRAMUSCULAR

## 2019-06-24 MED ORDER — DOXYCYCLINE HYCLATE 100 MG PO TABS: 100.0000 mg | ORAL_TABLET | Freq: Two times a day (BID) | ORAL | 0 refills | Status: DC

## 2019-06-24 NOTE — Patient Instructions (Signed)
Doxycycline 100 mg twice a day x 10 days. Depomedrol 80 mg IM. Symbicort and albuterol inhalers as directed.

## 2019-06-24 NOTE — Progress Notes (Signed)
   Subjective:    Patient ID: Candice [name], female    DOB: 1955-11-27, 63 y.o.   MRN: [id number]  HPI 63 year old Female with cough and congestion.  History of recurrent episodes of lower respiratory infections.  Quit smoking in January 2019.  Formerly smoked half a pack a day for 30 years.  Works as a Charity fundraiser for [employer].  No known COVID-19 exposure.  Productive cough and congestion.  History of diabetes treated with [name] and [name]  Review of Systems no fever or chills.  Has not had recent hemoglobin A1c but has appointment with endocrinologist in October.  History of vitamin D deficiency with last level 13 in December 2019     Objective:   Physical Exam She is afebrile.  Vital signs reviewed.  She is in no acute respiratory distress.  Skin warm and dry.  Nodes none.  TMs and pharynx are clear.  Neck is supple.  Chest exam reveals scattered wheezing without rales.  Deep congested cough.       Assessment & Plan:  Acute lower respiratory infection  Acute bronchospasm  Plan: Doxycycline 100 mg twice daily for 10 days.  Albuterol inhaler 2 sprays 4 times a day.  Symbicort 160/4.52 sprays every 12 hours.  Depo-Medrol 80 mg IM.

## 2019-06-27 ENCOUNTER — Encounter (HOSPITAL_COMMUNITY): Payer: Self-pay

## 2019-06-27 ENCOUNTER — Emergency Department (HOSPITAL_COMMUNITY): Payer: 59

## 2019-06-27 ENCOUNTER — Other Ambulatory Visit: Payer: Self-pay

## 2019-06-27 ENCOUNTER — Emergency Department (HOSPITAL_COMMUNITY)
Admission: EM | Admit: 2019-06-27 | Discharge: 2019-06-27 | Disposition: A | Discharge status: Home / Self Care | Payer: 59 | Attending: Emergency Medicine | Admitting: Emergency Medicine

## 2019-06-27 DIAGNOSIS — Z87891 Personal history of nicotine dependence: Secondary | ICD-10-CM | POA: Diagnosis not present

## 2019-06-27 DIAGNOSIS — J069 Acute upper respiratory infection, unspecified: Secondary | ICD-10-CM | POA: Diagnosis not present

## 2019-06-27 DIAGNOSIS — E119 Type 2 diabetes mellitus without complications: Secondary | ICD-10-CM | POA: Insufficient documentation

## 2019-06-27 DIAGNOSIS — Z20828 Contact with and (suspected) exposure to other viral communicable diseases: Secondary | ICD-10-CM | POA: Insufficient documentation

## 2019-06-27 DIAGNOSIS — Z79899 Other long term (current) drug therapy: Secondary | ICD-10-CM | POA: Insufficient documentation

## 2019-06-27 DIAGNOSIS — Z7984 Long term (current) use of oral hypoglycemic drugs: Secondary | ICD-10-CM | POA: Diagnosis not present

## 2019-06-27 DIAGNOSIS — Z91040 Latex allergy status: Secondary | ICD-10-CM | POA: Diagnosis not present

## 2019-06-27 DIAGNOSIS — Z7982 Long term (current) use of aspirin: Secondary | ICD-10-CM | POA: Insufficient documentation

## 2019-06-27 DIAGNOSIS — R05 Cough: Secondary | ICD-10-CM | POA: Diagnosis present

## 2019-06-27 LAB — GROUP A STREP BY PCR: Group A Strep by PCR: NOT DETECTED

## 2019-06-27 NOTE — ED Triage Notes (Signed)
Pt reports fever, body aches, sore throat, and dry cough over the last week. States that he doctor has been treating her from bronchitis, but today was the first day with fever.

## 2019-06-27 NOTE — ED Notes (Signed)
Pt declined recheck of vitals.  

## 2019-06-27 NOTE — Discharge Instructions (Addendum)
Continue your current medications, covid test was sent off and should be available within the next 24 hours, avoid contact with others until your results are available

## 2019-06-27 NOTE — ED Provider Notes (Signed)
[facility] DEPT Provider Note   CSN: [id number] Arrival date & time: 06/27/19  1952     History   Chief Complaint Chief Complaint  Patient presents with  . Cough  . Generalized Body Aches    HPI Candice [name] is a 63 y.o. female.     HPI Patient presents to the ED for evaluation of cough, myalgias.  Patient states her symptoms started last week.  She saw her doctor and was treated for bronchitis.  She has been taking doxycycline.  Patient states she continues to cough.  Today she started having myalgias and felt like she was developing the flu.  She started to feel feverish and developed a sore throat.  Patient is contact with her small grandchild and wants to make sure she has not been given anything.  No shortness of breath.  No vomiting or diarrhea. Past Medical History:  Diagnosis Date  . Anxiety   . Diabetes mellitus without complication ([place])   . Hx of colonic polyps    Fhx Colon cancer  . Hyperlipidemia   . Hypocholesteremia   . Tobacco abuse     Patient Active Problem List   Diagnosis Date Noted  . Obesity (BMI 30.0-34.9) 10/14/2017  . Generalized anxiety disorder 12/25/2014  . Vitamin D deficiency 12/25/2014  . Medication management 12/25/2014  . Hyperlipidemia associated with type 2 diabetes mellitus ([place]) 10/22/2013  . Back pain 03/31/2013  . Diabetes ([place]) 03/31/2013  . Former smoker     Past Surgical History:  Procedure Laterality Date  . APPENDECTOMY    . CESAREAN SECTION    . CHOLECYSTECTOMY    . TONSILLECTOMY       OB History   No obstetric history on file.      Home Medications    Prior to Admission medications   Medication Sig Start Date End Date Taking? Authorizing Provider  albuterol (VENTOLIN HFA) 108 (90 Base) MCG/ACT inhaler Inhale 2 puffs into the lungs every 6 (six) hours as needed for wheezing or shortness of breath. 06/24/19   Elby Showers, MD  aspirin EC 81 MG tablet Take 81 mg by mouth  daily.    [provider]  budesonide-formoterol (SYMBICORT) 160-4.5 MCG/ACT inhaler Inhale 2 puffs into the lungs 2 (two) times daily. 06/24/19   Elby Showers, MD  chlorpheniramine (CHLOR-TRIMETON) 4 MG tablet Take 2 tablets (8 mg total) by mouth 3 (three) times daily. 01/21/18   Mannam, Hart Robinsons, MD  Cholecalciferol (VITAMIN D3) 2000 units TABS Take 4,000 Units by mouth.    [provider]  citalopram (CELEXA) 40 MG tablet TAKE 1 TABLET EACH DAY. 04/11/19   Liane Comber, NP  doxycycline (MONODOX) 100 MG capsule Take 1 capsule 2 x /day for Diverticulitis 06/24/19   Elby Showers, MD  doxycycline (VIBRA-TABS) 100 MG tablet Take 1 tablet (100 mg total) by mouth 2 (two) times daily. 06/24/19   Elby Showers, MD  Dulaglutide ([name]) 1.5 XL/2.4MW SOPN Inject 1.5 mg into the skin once a week. 08/16/18   Philemon Kingdom, MD  empagliflozin ([name]) 25 MG TABS tablet Take 25 mg by mouth daily. 08/16/18   Philemon Kingdom, MD  glipiZIDE (GLUCOTROL) 5 MG tablet Take 1 tablet (5 mg total) by mouth 2 (two) times daily before a meal. 11/26/18   Philemon Kingdom, MD  glucose blood (ONETOUCH VERIO) test strip Use 2x a day 03/12/18   Philemon Kingdom, MD  Glycopyrrolate-Formoterol (BEVESPI AEROSPHERE) 9-4.8 MCG/ACT AERO Inhale 1  puff into the lungs 2 (two) times daily. 09/20/18   Margaree MackintoshBaxley, Mary J, MD  meclizine (ANTIVERT) 25 MG tablet 1/2-1 pill up to 3 times daily for motion sickness/dizziness Patient taking differently: as needed. 1/2-1 pill up to 3 times daily for motion sickness/dizziness 05/19/18   Judd Gaudierorbett, Ashley, NP  meloxicam (MOBIC) 15 MG tablet Take one daily with food for 2 weeks, can take with tylenol, can not take with aleve, iburpofen, then as needed daily for pain 10/08/18   Quentin Mullingollier, Amanda, PA-C  metFORMIN (GLUCOPHAGE-XR) 500 MG 24 hr tablet Take 1 to 2 tablets with a meal 2 x /day for Diabetes 06/11/18   Lucky CowboyMcKeown, William, MD  montelukast (SINGULAIR) 10 MG tablet TAKE ONE TABLET  AT BEDTIME. 02/02/19   Baxley, Luanna ColeMary J, MD  [facility] PointeNETOUCH DELICA LANCETS 33G MISC Check 2x a day 03/12/18   Carlus PavlovGherghe, Cristina, MD  rosuvastatin (CRESTOR) 40 MG tablet Take 1/2-1 tab as directed daily in the evening for cholesterol. 05/19/18   Judd Gaudierorbett, Ashley, NP  terconazole (TERAZOL 7) 0.4 % vaginal cream Place 1 applicator vaginally at bedtime. 03/12/18   Carlus PavlovGherghe, Cristina, MD    Family History Family History  Problem Relation Age of Onset  . AAA (abdominal aortic aneurysm) Mother   . Hyperlipidemia Mother   . Heart attack Brother 55  . Pneumonia Maternal Grandmother   . Cancer Sister        Colon  . Diabetes Sister   . Heart disease Sister     Social History Social History   Tobacco Use  . Smoking status: Former Smoker    Packs/day: 0.50    Years: 30.00    Pack years: 15.00    Types: Cigarettes    Quit date: 10/15/2017    Years since quitting: 1.6  . Smokeless tobacco: Never Used  Substance Use Topics  . Alcohol use: No    Alcohol/week: 0.0 standard drinks  . Drug use: No     Allergies   Levaquin [levofloxacin], Eggs or egg-derived products, Iodine, Levofloxacin in d5w, Metformin and related, Shellfish allergy, Vitamin d analogs, Amoxicillin, Kombiglyze [saxagliptin-metformin er], Latex, and Penicillins   Review of Systems Review of Systems  All other systems reviewed and are negative.    Physical Exam Updated Vital Signs BP (!) 151/74 (BP Location: Left Arm)   Pulse (!) 110   Temp 99.8 F (37.7 C) (Oral)   Resp 18   Ht 1.651 m (5\' 5" )   Wt 79.4 kg   SpO2 95%   BMI 29.12 kg/m   Physical Exam Vitals signs and nursing note reviewed.  Constitutional:      General: She is not in acute distress.    Appearance: She is well-developed.  HENT:     Head: Normocephalic and atraumatic.     Right Ear: External ear normal.     Left Ear: External ear normal.     Mouth/Throat:     Mouth: Mucous membranes are moist.     Pharynx: Posterior oropharyngeal erythema present.  No oropharyngeal exudate.     Comments: Mild erythema posterior pharynx, no exudate Eyes:     General: No scleral icterus.       Right eye: No discharge.        Left eye: No discharge.     Conjunctiva/sclera: Conjunctivae normal.  Neck:     Musculoskeletal: Neck supple.     Trachea: No tracheal deviation.  Cardiovascular:     Rate and Rhythm: Normal rate and regular rhythm.  Pulmonary:  Effort: Pulmonary effort is normal. No respiratory distress.     Breath sounds: Normal breath sounds. No stridor. No wheezing or rales.  Abdominal:     General: Bowel sounds are normal. There is no distension.     Palpations: Abdomen is soft.     Tenderness: There is no abdominal tenderness. There is no guarding or rebound.  Musculoskeletal:        General: No tenderness.  Skin:    General: Skin is warm and dry.     Findings: No rash.  Neurological:     Mental Status: She is alert.     Cranial Nerves: No cranial nerve deficit (no facial droop, extraocular movements intact, no slurred speech).     Sensory: No sensory deficit.     Motor: No abnormal muscle tone or seizure activity.     Coordination: Coordination normal.      ED Treatments / Results  Labs (all labs ordered are listed, but only abnormal results are displayed) Labs Reviewed  GROUP A STREP BY PCR  SARS CORONAVIRUS 2 (TAT 6-24 HRS)    EKG None  Radiology Dg Chest Portable 1 View  Result Date: 06/27/2019 CLINICAL DATA:  Cough and shortness of breath, fever EXAM: PORTABLE CHEST 1 VIEW COMPARISON:  12/18/2017 FINDINGS: No focal consolidation or effusion. Mild chronic appearing bronchitic changes at the bases. Normal heart size. No pneumothorax. IMPRESSION: No active disease. Electronically Signed   By: Jasmine Pang M.D.   On: 06/27/2019 21:35    Procedures Procedures (including critical care time)  Medications Ordered in ED Medications - No data to display   Initial Impression / Assessment and Plan / ED Course  I  have reviewed the triage vital signs and the nursing notes.  Pertinent labs & imaging results that were available during my care of the patient were reviewed by me and considered in my medical decision making (see chart for details).  Clinical Course as of Jun 26 2305  Mon Jun 27, 2019  2159 Chest x-ray negative for acute findings   [JK]    Clinical Course User Index [JK] Linwood Dibbles, MD     Patient presents with persistent cough.  ED work-up is reassuring.  Patient has mild tachycardia initial vital signs but on my exam had a normal heart rate, she was not tachypneic and had a normal oxygen saturation.  Patient will be tested for COVID-19 but fortunately no signs of any severe illness.  At this time there does not appear to be any evidence of an acute emergency medical condition and the patient appears stable for discharge with appropriate outpatient follow up.  Candice [name] was evaluated in Emergency Department on 06/27/2019 for the symptoms described in the history of present illness. She was evaluated in the context of the global COVID-19 pandemic, which necessitated consideration that the patient might be at risk for infection with the SARS-CoV-2 virus that causes COVID-19. Institutional protocols and algorithms that pertain to the evaluation of patients at risk for COVID-19 are in a state of rapid change based on information released by regulatory bodies including the CDC and federal and state organizations. These policies and algorithms were followed during the patient's care in the ED.   Final Clinical Impressions(s) / ED Diagnoses   Final diagnoses:  Upper respiratory tract infection, unspecified type    ED Discharge Orders    None       Linwood Dibbles, MD 06/27/19 2307

## 2019-06-28 LAB — SARS CORONAVIRUS 2 (TAT 6-24 HRS): SARS Coronavirus 2: NEGATIVE

## 2019-06-29 ENCOUNTER — Ambulatory Visit: Payer: 59 | Admitting: Internal Medicine

## 2019-07-09 ENCOUNTER — Other Ambulatory Visit: Payer: Self-pay | Admitting: Internal Medicine

## 2019-08-10 ENCOUNTER — Ambulatory Visit: Payer: 59 | Admitting: Internal Medicine

## 2019-08-10 ENCOUNTER — Encounter: Payer: Self-pay | Admitting: Internal Medicine

## 2019-08-10 ENCOUNTER — Other Ambulatory Visit: Payer: Self-pay

## 2019-08-10 VITALS — BP 118/68 | HR 87 | Ht 63.15 in | Wt 174.0 lb

## 2019-08-10 DIAGNOSIS — E1165 Type 2 diabetes mellitus with hyperglycemia: Secondary | ICD-10-CM

## 2019-08-10 DIAGNOSIS — E1169 Type 2 diabetes mellitus with other specified complication: Secondary | ICD-10-CM

## 2019-08-10 DIAGNOSIS — B373 Candidiasis of vulva and vagina: Secondary | ICD-10-CM | POA: Diagnosis not present

## 2019-08-10 DIAGNOSIS — E785 Hyperlipidemia, unspecified: Secondary | ICD-10-CM | POA: Diagnosis not present

## 2019-08-10 DIAGNOSIS — B3731 Acute candidiasis of vulva and vagina: Secondary | ICD-10-CM

## 2019-08-10 LAB — POCT GLYCOSYLATED HEMOGLOBIN (HGB A1C): Hemoglobin A1C: 8.9 % — AB (ref 4.0–5.6)

## 2019-08-10 MED ORDER — ONETOUCH VERIO VI STRP: ORAL_STRIP | 3 refills | Status: DC

## 2019-08-10 MED ORDER — JARDIANCE 25 MG PO TABS: 25.0000 mg | ORAL_TABLET | Freq: Every day | ORAL | 3 refills | Status: DC

## 2019-08-10 MED ORDER — ONETOUCH DELICA LANCETS 33G MISC: 3 refills | Status: DC

## 2019-08-10 MED ORDER — TRULICITY 3 MG/0.5ML ~~LOC~~ SOAJ: 3.0000 mg | SUBCUTANEOUS | 11 refills | Status: DC

## 2019-08-10 NOTE — Progress Notes (Signed)
Patient ID: Candice [name], female   DOB: December 12, 1955, 63 y.o.   MRN: [id number]   HPI: Candice [name] is a 63 y.o.-year-old female, returning for follow-up for DM2, dx in 2012-2013, non-insulin-dependent, uncontrolled, without long term complications.  Last visit 8 months ago. PCP: Candice GaudierAshley Corbett PA  In the last 2 months, her refrigerator broke >> had to eat her meals out >> sugars were sent. She just got  a new refrigerator last week.  Last hemoglobin A1c was: Lab Results  Component Value Date   HGBA1C 9.2 (A) 11/26/2018   HGBA1C 7.7 (A) 08/16/2018   HGBA1C 9.5 (A) 03/12/2018   Pt is on a regimen of: - Glipizide 5 mg before dinner >> 5 mg twice a day -she takes this after meals! - [name] 25 mg before breakfast - [name] 1.5 mg weekly -started 02/2018-tolerated well We retried Metformin ER 500 mg daily with dinner in 08/2018-she had nausea and AP and had to stop Used [place]xiga in the past >> not covered anymore. No yeast inf's. She was on Glipizide 5 mg 2x a day before meals She was on Victoza >> tolerated it well.  At last visit she was not checking sugars. She now checks sugars 1x a day in am: - am: n/c >> 110, 150-165, 200 >> ? >> 130-180 - 2h after b'fast: 220 >> n/c - before lunch: n/c - 2h after lunch: n/c - before dinner: n/c - 2h after dinner: n/c - bedtime: n/c >> 180-190s >> n/c - nighttime: n/c Lowest sugar was 110 >> ? >> 130;  It is unclear at which CBG level she has hypoglycemia awareness Highest sugar was 285 >> 200 >> ? >> 180.  Glucometer: none  Pt's meals are: - Breakfast: PB crackers, fruit, sausage McMuffin at 10 am - Lunch: chicken salad wraps, sandwiches, baked chips - Dinner: veggies, meats, salad - Snacks: crackers  No CKD, last BUN/creatinine:  Lab Results  Component Value Date   BUN 15 09/16/2018   BUN 12 05/19/2018   CREATININE 0.72 09/16/2018   CREATININE 0.77 05/19/2018   + HL; last set of lipids: Lab Results  Component Value  Date   CHOL 244 (H) 09/16/2018   HDL 40 (L) 09/16/2018   LDLCALC 154 (H) 09/16/2018   TRIG 328 (H) 09/16/2018   CHOLHDL 6.1 (H) 09/16/2018  On Crestor 20.  - last eye exam was in 02/2018: No DR + cataract  -No numbness and tingling in her feet.  On ASA 81.  Pt has FH of DM in sister.  She has a history of shoulder bursitis.  ROS: Constitutional: no weight gain/no weight loss, no fatigue, no subjective hyperthermia, no subjective hypothermia Eyes: no blurry vision, no xerophthalmia ENT: no sore throat, no nodules palpated in neck, no dysphagia, no odynophagia, no hoarseness Cardiovascular: no CP/no SOB/no palpitations/no leg swelling Respiratory: no cough/no SOB/no wheezing Gastrointestinal: no N/no V/no D/no C/no acid reflux Musculoskeletal: no muscle aches/+ joint aches Skin: no rashes, no hair loss Neurological: no tremors/no numbness/no tingling/no dizziness  I reviewed pt's medications, allergies, PMH, social hx, family hx, and changes were documented in the history of present illness. Otherwise, unchanged from my initial visit note.  Past Medical History:  Diagnosis Date  . Anxiety   . Diabetes mellitus without complication (HCC)   . Hx of colonic polyps    Fhx Colon cancer  . Hyperlipidemia   . Hypocholesteremia   . Tobacco abuse    Past Surgical History:  Procedure  Laterality Date  . APPENDECTOMY    . CESAREAN SECTION    . CHOLECYSTECTOMY    . TONSILLECTOMY     Social History   Socioeconomic History  . Marital status: Married    Spouse name: Not on file  . Number of children: 1  . Years of education: Not on file  . Highest education level: Not on file  Occupational History  .  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 Financial resource strain: Not on file  . Food insecurity:    Worry: Not on file    Inability: Not on file  . Transportation needs:    Medical: Not on file    Non-medical: Not on file  Tobacco Use  . Smoking status:  Former Smoker    Packs/day: 0.50    Years: 30.00    Pack years: 15.00    Types: Cigarettes    Last attempt to quit: 10/15/2017    Years since quitting: 0.4  . Smokeless tobacco: Never Used  Substance and Sexual Activity  . Alcohol use: No    Alcohol/week: 0.0 oz  . Drug use: No   Current Outpatient Medications on File Prior to Visit  Medication Sig Dispense Refill  . albuterol (VENTOLIN HFA) 108 (90 Base) MCG/ACT inhaler Inhale 2 puffs into the lungs every 6 (six) hours as needed for wheezing or shortness of breath. 6.7 g prn  . aspirin EC 81 MG tablet Take 81 mg by mouth daily.    . budesonide-formoterol (SYMBICORT) 160-4.5 MCG/ACT inhaler Inhale 2 puffs into the lungs 2 (two) times daily. 1 Inhaler 3  . chlorpheniramine (CHLOR-TRIMETON) 4 MG tablet Take 2 tablets (8 mg total) by mouth 3 (three) times daily. 190 tablet 0  . Cholecalciferol (VITAMIN D3) 2000 units TABS Take 4,000 Units by mouth.    . citalopram (CELEXA) 40 MG tablet TAKE 1 TABLET EACH DAY. 30 tablet 0  . doxycycline (VIBRA-TABS) 100 MG tablet Take 1 tablet (100 mg total) by mouth 2 (two) times daily. 20 tablet 0  . Dulaglutide ([name]) 1.5 DJ/4.9FW SOPN Inject 1.5 mg into the skin once a week. 12 pen 3  . empagliflozin ([name]) 25 MG TABS tablet Take 25 mg by mouth daily. 90 tablet 3  . glipiZIDE (GLUCOTROL) 5 MG tablet Take 1 tablet (5 mg total) by mouth 2 (two) times daily before a meal. 180 tablet 3  . glucose blood (ONETOUCH VERIO) test strip Use 2x a day 200 each 3  . Glycopyrrolate-Formoterol (BEVESPI AEROSPHERE) 9-4.8 MCG/ACT AERO Inhale 1 puff into the lungs 2 (two) times daily. 5.9 g 2  . meclizine (ANTIVERT) 25 MG tablet 1/2-1 pill up to 3 times daily for motion sickness/dizziness (Patient taking differently: as needed. 1/2-1 pill up to 3 times daily for motion sickness/dizziness) 30 tablet 0  . meloxicam (MOBIC) 15 MG tablet Take one daily with food for 2 weeks, can take with tylenol, can not take with  aleve, iburpofen, then as needed daily for pain 30 tablet 1  . metFORMIN (GLUCOPHAGE-XR) 500 MG 24 hr tablet Take 1 to 2 tablets with a meal 2 x /day for Diabetes 360 tablet 0  . montelukast (SINGULAIR) 10 MG tablet TAKE ONE TABLET AT BEDTIME. 90 tablet 3  . ONETOUCH DELICA LANCETS 26V MISC Check 2x a day 200 each 3  . rosuvastatin (CRESTOR) 40 MG tablet Take 1/2-1 tab as directed daily in the evening for cholesterol. 90 tablet 1  . terconazole (TERAZOL 7) 0.4 %  vaginal cream Place 1 applicator vaginally at bedtime. 45 g 2   No current facility-administered medications on file prior to visit.    Allergies  Allergen Reactions  . Levaquin [Levofloxacin] Hives and Other (See Comments)    Tingling sensation to mouth, near LOC, blurred vision,    . Eggs Or Egg-Derived Products     Unknown reaction   . Iodine     Unknown reaction   . Levofloxacin In D5w Hives    Hives  . Metformin And Related Diarrhea  . Shellfish Allergy     Unknown reaction   . Vitamin D Analogs Diarrhea    High dose Vitamin D caused diarrhea  . Amoxicillin Rash  . Kombiglyze [Saxagliptin-Metformin Er] Other (See Comments)    Bloating  . Latex Itching and Rash  . Penicillins Rash   Family History  Problem Relation Age of Onset  . AAA (abdominal aortic aneurysm) Mother   . Hyperlipidemia Mother   . Heart attack Brother 55  . Pneumonia Maternal Grandmother   . Cancer Sister        Colon  . Diabetes Sister   . Heart disease Sister     PE: BP 118/68   Pulse 87   Ht 5' 3.15" (1.604 m) Comment: measured today without shoes  Wt 174 lb (78.9 kg)   SpO2 98%   BMI 30.68 kg/m  Wt Readings from Last 3 Encounters:  08/10/19 174 lb (78.9 kg)  06/27/19 175 lb (79.4 kg)  11/26/18 178 lb (80.7 kg)   Constitutional: overweight, in NAD Eyes: PERRLA, EOMI, no exophthalmos ENT: moist mucous membranes, no thyromegaly, no cervical lymphadenopathy Cardiovascular: RRR, No MRG Respiratory: CTA B Gastrointestinal:  abdomen soft, NT, ND, BS+ Musculoskeletal: no deformities, strength intact in all 4 Skin: moist, warm, no rashes Neurological: no tremor with outstretched hands, DTR normal in all 4  ASSESSMENT: 1. DM2, non-insulin-dependent, uncontrolled, without long term complications, but with hyperglycemia  2. Yeast vaginitis  3. HL  PLAN:  1. Patient with longstanding, uncontrolled, type 2 diabetes, on oral antidiabetic regimen and weekly GLP-1 receptor agonist.  We could not use Metformin ER due to abdominal pain and diarrhea.  At last visit, she was not checking sugars at all.  I strongly advised her to restart.  At that time, she was very stressed as she had to move to another office for work and she also missed 2 weeks of [name].  Sugars were higher.  HbA1c was 9.2%.  I advised her to increase glipizide to 5 mg twice a day and continue [name] and [name].  She refused starting basal insulin at that time.  She wanted to work on her diet. -At this visit, she tells me that her sugars improved after our last visit as she started to improve her diet.  However, I refrigerator broke afterwards and she was eating out on a daily basis so her sugars started to worsen again.  She now checks her sugars daily but only checks in the morning, not rotating the checks throughout the day.  I advised her to start checking sugars at different times of the day so that she can understand her diabetes better. -Otherwise, for now, I advised her to move glipizide before meals and increase [name], since her sugars are still high.  FDA recently approved 3 and 4.5 mg weekly doses, so I suggested that she increase the dose to 3 mg weekly. - I suggested to:  Patient Instructions  Please continue: - [name] 25  mg before breakfast - Glipizide 5 mg 2x a day but move this 15-30 min before breakfast and dinner  Please increase: - [name] to 3 mg weekly  Check sugars 1x a day, rotating check times.  Please return in  3 months with your sugar log.   - we checked her HbA1c: 8.9% (slightly lower) - advised to check sugars at different times of the day - 1x a day, rotating check times - advised for yearly eye exams >> she is not UTD - not UTD with flu shot due to apparent egg allergy.  However, she tells me that she can eat eggs without problems - return to clinic in 3 months    2.  Yeast vaginitis -She had Diflucan courses in the past but she also uses terconazole cream: Terconazole Cream 0.4 %: - insert 1 applicatorful vaginally at bedtime every night for 1 week, then - insert 1/2 applicatorful at bedtime once weekly indefinitely to maintain effect -She did not have any more yeast infections after starting terconazole  3. HL -Reviewed latest lipid panel from 09/2018: LDL and triglycerides very high, HDL low Lab Results  Component Value Date   CHOL 244 (H) 09/16/2018   HDL 40 (L) 09/16/2018   LDLCALC 154 (H) 09/16/2018   TRIG 328 (H) 09/16/2018   CHOLHDL 6.1 (H) 09/16/2018  -Continues Crestor without side effects  Carlus Pavlov, MD PhD [facility] Endocrinology

## 2019-08-10 NOTE — Patient Instructions (Addendum)
Please continue: - [name] 25 mg before breakfast - Glipizide 5 mg 2x a day but move this 15-30 min before breakfast and dinner  Please increase: - [name] to 3 mg weekly  Check sugars 1x a day, rotating check times.  Please return in 3 months with your sugar log.

## 2019-08-11 DIAGNOSIS — E1122 Type 2 diabetes mellitus with diabetic chronic kidney disease: Secondary | ICD-10-CM | POA: Insufficient documentation

## 2019-08-11 NOTE — Progress Notes (Addendum)
Complete Physical  Assessment and Plan:  Candice [name] was seen today for annual exam.  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 Cruzita Lederer;  Education: Reviewed 'ABCs' of diabetes management (respective goals in parentheses):  A1C (<7), blood pressure (<130/80), and cholesterol (LDL <70) Eye Exam yearly and Dental Exam every 6 months - patient agrees to schedule Dietary recommendations Physical Activity recommendations -     HM DIABETES FOOT EXAM  Hyperlipidemia associated with type 2 diabetes mellitus ([place]) Continue medications: rosuvastatin 40 mg daily  Has been out of medication; reordered LDL goal <70 Continue low cholesterol diet and exercise.  Check lipid panel.  -     Lipid panel -     TSH  Vitamin D deficiency Try drops for supplement discussed due to historical intolerance with caps At minimum need 30+  -     VITAMIN D 25 Hydroxy (Vit-D Deficiency, Fractures)  Medication management -     CBC with Differential/Platelet -     COMPLETE METABOLIC PANEL WITH GFR -     Magnesium -     Urinalysis, Routine w reflex microscopic  Obesity (BMI 30.0-34.9) Long discussion about weight loss, diet, and exercise Recommended diet heavy in fruits and veggies and low in animal meats, cheeses, and dairy products, appropriate calorie intake Discussed appropriate weight for height Follow up at next visit  Polycythemia -     CBC with Differential/Platelet -     Iron,Total/Total Iron Binding Cap  Anxiety Well controlled with current medication; she did not  tolerate taper Stress management techniques discussed, increase water, good sleep hygiene discussed, increase exercise, and increase veggies.   Anemia, unspecified type -     Vitamin B12  Labile hypertension Typically well controlled; she declines medication, ACE/ARB despite discussion of benefits with T2DM and renal protection Monitor blood pressure at home; call if consistently over 130/80 Continue DASH diet.   Reminder to go to the ER if any CP, SOB, nausea, dizziness, severe HA, changes vision/speech, left arm numbness and tingling and jaw pain. -     CBC with Differential/Platelet -     COMPLETE METABOLIC PANEL WITH GFR -     Magnesium -     TSH -     Microalbumin / creatinine urine ratio -     Urinalysis, Routine w reflex microscopic -     EKG 12-Lead -     [place], RETROPERITNL ABD,  LTD  Need for hepatitis C screening test -     Hepatitis C antibody  Family history of abdominal aortic aneurysm (AAA)/Screening for AAA (abdominal aortic aneurysm) -     [place], RETROPERITNL ABD,  LTD  Need for immunization against influenza She states egg allergy by prick test only; eats eggs regularly, has had flu vaccine without problems except local itching; agrees to proceed with vaccine today after discussion of risks/benefits She was monitored for 15-20 min without apparent intolerance prior to leaving the premises Suggested she take benadryl 50 mg at home tonight -     FLU VACCINE MDCK QUAD W/Preservative  Discussed med's effects and SE's. Screening labs and tests as requested with regular follow-up as recommended. Over 40 minutes of exam, counseling, chart review, and complex, high level critical decision making was performed this visit.   Future Appointments  Date Time Provider Department Center  11/16/2019 10:30 AM Carlus Pavlov, MD [facility] None  11/30/2019 10:45 AM Judd Gaudier, NP [facility] None  02/29/2020 10:45 AM Judd Gaudier, NP [facility] None  08/29/2020 10:00 AM  Judd Gaudier, NP [facility] None     HPI  63 y.o. 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   She is married, 1 daughter, 1 grandson, 52 years old.  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   She follows with GYN, has appointment this month for PAP and mammogram.   She is on OTC antihistamine and Singulair for seasonable allergies.   She is on celexa 40 mg daily for anxiety hasn't done well with attempts to taper.   BMI is Body mass index is 31.68 kg/m., she has been working on diet and exercise, trying to walk most days, has a new dog, 20-30 min daily. She knows she could eat better.  Wt Readings from Last 3 Encounters:  08/15/19 176 lb (79.8 kg)  08/10/19 174 lb (78.9 kg)  06/27/19 175 lb (79.4 kg)   Her blood pressure has been controlled at home, today their BP is BP: 126/68 She does workout. She denies chest pain, shortness of breath, dizziness.   She is on cholesterol medication (rosuvastatin 40 mg daily but has since run out)  and denies myalgias. Her cholesterol is not at goal. The cholesterol last visit was:   Lab Results  Component Value Date   CHOL 244 (H) 09/16/2018   HDL 40 (L) 09/16/2018   LDLCALC 154 (H) 09/16/2018   TRIG 328 (H) 09/16/2018   CHOLHDL 6.1 (H) 09/16/2018   She has been working on diet and exercise for T2DM (managed by Dr. Elvera Lennox, currently on glipizide 5 mg BID, trulicity, jardiance, (didn't tolerate metformin) she is on bASA, she is not on ACE/ARB (low BPs, patient adamantly declines) and denies hypoglycemia , increased appetite, nausea, paresthesia of the feet, polydipsia and polyuria. She does check fasting glucose, ranging 130-160. Last A1C  in the office was:  Lab Results  Component Value Date   HGBA1C 8.9 (A) 08/10/2019   Last GFR: Lab Results  Component Value Date   GFRNONAA 91 09/16/2018   Patient is not on Vitamin D supplement.   Lab Results  Component Value Date   VD25OH 13 (L) 09/16/2018         Current Medications:  Current Outpatient Medications on File Prior to Visit  Medication Sig Dispense Refill  . aspirin EC 81 MG tablet Take 81 mg by mouth daily.    . citalopram (CELEXA) 40 MG tablet TAKE 1 TABLET EACH DAY. 30 tablet 0  . Dulaglutide ([name]) 3 MG/0.5ML SOPN Inject 3 mg into the skin once a week. 4 pen 11  . empagliflozin ([name]) 25 MG TABS tablet Take 25 mg by mouth daily. 90 tablet 3  . glipiZIDE (GLUCOTROL) 5 MG tablet Take 1 tablet (5 mg total) by mouth 2 (two) times daily before a meal. 180 tablet 3  .  glucose blood (ONETOUCH VERIO) test strip Use 2x a day 200 each 3  . meclizine (ANTIVERT) 25 MG tablet 1/2-1 pill up to 3 times daily for motion sickness/dizziness (Patient taking differently: as needed. 1/2-1 pill up to 3 times daily for motion sickness/dizziness) 30 tablet 0  . meloxicam (MOBIC) 15 MG tablet Take one daily with food for 2 weeks, can take with tylenol, can not take with aleve, iburpofen, then as needed daily for pain 30 tablet 1  . montelukast (SINGULAIR) 10 MG tablet TAKE ONE TABLET AT BEDTIME. 90 tablet 3  . OneTouch Delica Lancets 33G MISC Check 2x a day 200 each 3  . OVER THE COUNTER MEDICATION Takes an OTC Allertec    . rosuvastatin (CRESTOR) 40 MG tablet Take 1/2-1 tab as directed daily in the evening for cholesterol. 90 tablet 1  . terconazole (TERAZOL 7) 0.4 % vaginal cream Place 1 applicator vaginally at bedtime. (Patient taking differently: Place 1 applicator vaginally as needed. ) 45 g 2  . chlorpheniramine (CHLOR-TRIMETON) 4 MG tablet Take 2 tablets (8 mg total) by mouth 3 (three) times daily. (Patient not taking: Reported on 08/15/2019) 190 tablet 0  .  Cholecalciferol (VITAMIN D3) 2000 units TABS Take 4,000 Units by mouth.     No current facility-administered medications on file prior to visit.    Allergies:  Allergies  Allergen Reactions  . Levaquin [Levofloxacin] Hives and Other (See Comments)    Tingling sensation to mouth, near LOC, blurred vision,    . Iodine     Unknown reaction   . Levofloxacin In D5w Hives    Hives  . Metformin And Related Diarrhea  . Shellfish Allergy     Unknown reaction   . Vitamin D Analogs Diarrhea    High dose Vitamin D caused diarrhea  . Amoxicillin Rash  . Eggs Or Egg-Derived Products     Low, by skin test only, local itching when injected   . Kombiglyze [Saxagliptin-Metformin Er] Other (See Comments)    Bloating  . Latex Itching and Rash  . 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 Health Maintenance:   Immunization History  Administered Date(s) Administered  . Influenza Inj Mdck Quad With Preservative 08/15/2019  . Tdap 02/14/2013    Tetanus: 2014 Pneumovax: ? 2014, reports has had Prevnar 13:  Flu vaccine: ? Hx of egg allergy, she denies this, eats eggs regularly, had sensitivity on skin scratch test, has tolerated in the past; administered today and monitored for 20 min without complication today  Zostavax: -  LMP: No LMP recorded. Patient is postmenopausal. Pap: 2019, gets annually at GYN, has scheduled - requested she forward report MGM: 2019,  DEXA: n/a  Colonoscopy: 12/2010, due 2022 EGD: n/a  Last [facility], last 2018, overdue to schedule  Last Eye Exam: [facility] Allena KatzPatel, [facility], last 02/2018 - report in chart and abstracted - DUE, patient aware needs to schedule    Patient Care Team: Lucky CowboyMcKeown, William, MD as PCP - General (Internal Medicine) Henreitta LeberPowell, [place], PA-C as Physician Assistant (Obstetrics and Gynecology)  Surgical History:  She has a past surgical history that includes Cesarean section; Tonsillectomy; Appendectomy; and Cholecystectomy. Family History:  Herfamily history includes AAA (abdominal aortic aneurysm) in her  mother; Brain cancer (age of onset: 62) in her maternal grandmother; Cancer in her sister; Diabetes in her sister; Heart attack in her maternal grandfather; Heart attack (age of onset: 30) in her brother; Heart disease in her sister; Hyperlipidemia in her mother. Social History:  She reports that she quit smoking about 22 months ago. Her smoking use included cigarettes. She has a 15.00 pack-year smoking history. She has never used smokeless tobacco. She reports that she does not drink alcohol or use drugs.  Review of Systems: Review of Systems  Constitutional: Negative for malaise/fatigue and weight loss.  HENT: Negative for hearing loss and tinnitus.   Eyes: Negative for blurred vision and double vision.  Respiratory: Negative for cough, sputum production, shortness of breath and wheezing.   Cardiovascular: Negative for chest pain, palpitations, orthopnea, claudication, leg swelling and PND.  Gastrointestinal: Negative for abdominal pain, blood in stool, constipation, diarrhea, heartburn, melena, nausea and vomiting.  Genitourinary: Negative.   Musculoskeletal: Negative for falls, joint pain and myalgias.  Skin: Negative for rash.  Neurological: Negative for dizziness, tingling, sensory change, weakness and headaches.  Endo/Heme/Allergies: Negative for polydipsia.  Psychiatric/Behavioral: Negative.  Negative for depression, memory loss, substance abuse and suicidal ideas. The patient is not nervous/anxious and does not have insomnia.   All other systems reviewed and are negative.   Physical Exam: Estimated body mass index  is 31.68 kg/m as calculated from the following:   Height as of this encounter: 5' 2.5" (1.588 m).   Weight as of this encounter: 176 lb (79.8 kg). BP 126/68   Pulse 98   Temp (!) 97 F (36.1 C)   Ht 5' 2.5" (1.588 m)   Wt 176 lb (79.8 kg)   SpO2 97%   BMI 31.68 kg/m  General Appearance: Well nourished, in no apparent distress.  Eyes: PERRLA, EOMs, conjunctiva no swelling or erythema, fundal exam deferred to ophth  Sinuses: No Frontal/maxillary tenderness  ENT/Mouth: Ext aud canals clear, normal light reflex with TMs without erythema, bulging. Good dentition. No erythema, swelling, or exudate on post pharynx. Tonsils not swollen or erythematous. Hearing normal.  Neck: Supple, thyroid normal. No bruits  Respiratory: Respiratory effort normal, BS equal bilaterally without rales, rhonchi, wheezing or stridor.  Cardio: RRR without murmurs, rubs or gallops. Brisk peripheral pulses without edema.  Chest: symmetric, with normal excursions and percussion.  Breasts: Defer to GYN Abdomen: Soft, nontender, no guarding, rebound, hernias, masses, or organomegaly.  Lymphatics: Non tender without lymphadenopathy.  Genitourinary: Defer to GYN Musculoskeletal: Full ROM all peripheral extremities,5/5 strength, and normal gait.  Skin: Warm, dry without rashes, lesions, ecchymosis. Neuro: Cranial nerves intact, reflexes equal bilaterally. Normal muscle tone, no cerebellar symptoms. Sensation intact.  Psych: Awake and oriented X 3, normal affect, Insight and Judgment appropriate.   EKG: WNL no ST changes.  AAA [place]: WNL 08/15/2019  Dan Maker 5:36 PM [facility]

## 2019-08-15 ENCOUNTER — Other Ambulatory Visit: Payer: Self-pay

## 2019-08-15 ENCOUNTER — Ambulatory Visit: Payer: 59 | Admitting: Adult Health

## 2019-08-15 ENCOUNTER — Encounter: Payer: Self-pay | Admitting: Adult Health

## 2019-08-15 VITALS — BP 126/68 | HR 98 | Temp 97.0°F | Ht 62.5 in | Wt 176.0 lb

## 2019-08-15 DIAGNOSIS — Z87891 Personal history of nicotine dependence: Secondary | ICD-10-CM | POA: Diagnosis not present

## 2019-08-15 DIAGNOSIS — Z136 Encounter for screening for cardiovascular disorders: Secondary | ICD-10-CM | POA: Diagnosis not present

## 2019-08-15 DIAGNOSIS — I10 Essential (primary) hypertension: Secondary | ICD-10-CM | POA: Diagnosis not present

## 2019-08-15 DIAGNOSIS — F419 Anxiety disorder, unspecified: Secondary | ICD-10-CM

## 2019-08-15 DIAGNOSIS — E1122 Type 2 diabetes mellitus with diabetic chronic kidney disease: Secondary | ICD-10-CM

## 2019-08-15 DIAGNOSIS — E559 Vitamin D deficiency, unspecified: Secondary | ICD-10-CM

## 2019-08-15 DIAGNOSIS — D649 Anemia, unspecified: Secondary | ICD-10-CM

## 2019-08-15 DIAGNOSIS — F411 Generalized anxiety disorder: Secondary | ICD-10-CM

## 2019-08-15 DIAGNOSIS — D751 Secondary polycythemia: Secondary | ICD-10-CM

## 2019-08-15 DIAGNOSIS — Z0001 Encounter for general adult medical examination with abnormal findings: Secondary | ICD-10-CM

## 2019-08-15 DIAGNOSIS — Z23 Encounter for immunization: Secondary | ICD-10-CM | POA: Diagnosis not present

## 2019-08-15 DIAGNOSIS — Z0000 Encounter for general adult medical examination without abnormal findings: Secondary | ICD-10-CM | POA: Diagnosis not present

## 2019-08-15 DIAGNOSIS — Z8249 Family history of ischemic heart disease and other diseases of the circulatory system: Secondary | ICD-10-CM

## 2019-08-15 DIAGNOSIS — Z1159 Encounter for screening for other viral diseases: Secondary | ICD-10-CM

## 2019-08-15 DIAGNOSIS — E1169 Type 2 diabetes mellitus with other specified complication: Secondary | ICD-10-CM

## 2019-08-15 DIAGNOSIS — E66811 Obesity, class 1: Secondary | ICD-10-CM

## 2019-08-15 DIAGNOSIS — N181 Chronic kidney disease, stage 1: Secondary | ICD-10-CM

## 2019-08-15 DIAGNOSIS — E785 Hyperlipidemia, unspecified: Secondary | ICD-10-CM

## 2019-08-15 DIAGNOSIS — Z79899 Other long term (current) drug therapy: Secondary | ICD-10-CM

## 2019-08-15 DIAGNOSIS — E669 Obesity, unspecified: Secondary | ICD-10-CM

## 2019-08-15 NOTE — Patient Instructions (Addendum)
Candice [name] , Thank you for taking time to come for your Annual Wellness Visit. I appreciate your ongoing commitment to your health goals. Please review the following plan we discussed and let me know if I can assist you in the future.   These are the goals we discussed: Goals    . Exercise 150 min/wk Moderate Activity    . Fasting Blood Glucose <150    . HEMOGLOBIN A1C < 7.0    . LDL CALC < 70    . Weight (lb) < 160 lb (72.6 kg)       This is a list of the screening recommended for you and due dates:  Health Maintenance  Topic Date Due  .  Hepatitis C: One time screening is recommended by Center for Disease Control  (CDC) for  adults born from [birth year] through 1963.   Sep 19, 1956  . Mammogram  11/04/2018  . Complete foot exam   11/25/2018  . Eye exam for diabetics  02/13/2019  . Flu Shot  05/14/2019  . Urine Protein Check  05/20/2019  . Pap Smear  11/05/2019  . Hemoglobin A1C  02/08/2020  . Colon Cancer Screening  12/18/2020  . Tetanus Vaccine  02/15/2023  . Pneumococcal vaccine  Completed  . HIV Screening  Discontinued      Please have your GYN forward [place] PAP and mammogram reports   Try liquid drops for vitamin D     Know what a healthy weight is for you (roughly BMI <25) and aim to maintain this  Aim for 7+ servings of fruits and vegetables daily  65-80+ fluid ounces of water or unsweet tea for healthy kidneys  Limit to max 1 drink of alcohol per day; avoid smoking/tobacco  Limit animal fats in diet for cholesterol and heart health - choose grass fed whenever available  Avoid highly processed foods, and foods high in saturated/trans fats  Aim for low stress - take time to unwind and care for your mental health  Aim for 150 min of moderate intensity exercise weekly for heart health, and weights twice weekly for bone health  Aim for 7-9 hours of sleep daily     Drink 1/2 your body weight in fluid ounces of water daily; drink a tall glass of water 30 min  before meals  Don't eat until you're stuffed- listen to your stomach and eat until you are 80% full   Try eating off of a salad plate; wait 10 min after finishing before going back for seconds  Start by eating the vegetables on your plate; aim for 17% of your meals to be fruits or vegetables  Then eat your protein - lean meats (grass fed if possible), fish, beans, nuts in moderation  Eat your carbs/starch last ONLY if you still are hungry. If you can, stop before finishing it all  Avoid sugar and flour - the closer it looks to it's original form in nature, typically the better it is for you  Splurge in moderation - "assign" days when you get to splurge and have the "bad stuff" - I like to follow a 80% - 20% plan- "good" choices 80 % of the time, "bad" choices in moderation 20% of the time  Simple equation is: Calories out > calories in = weight loss - even if you eat the bad stuff, if you limit portions, you will still lose weight     8 Critical Weight-Loss Tips That Aren't Diet and Exercise  1. STARVE THE DISTRACTIONS  All too often when we eat, we're also multitasking: watching TV, answering emails, scrolling through social media. These habits are detrimental to having a strong, clear, healthy relationship with food, and they can hinder our ability to make dietary changes.  In order to truly focus on what you're eating, how much you're eating, why you're eating those specific foods and, most importantly, how those foods make you feel, you need to starve the distractions. That means when you eat, just eat. Focus on your food, the process it went through to end up on your plate, where it came from and how it nourishes you. With this technique, you're more likely to finish a meal feeling satiated.  2.  CONSIDER WHAT YOU'RE NOT WILLING TO DO  This might sound counterintuitive, but it can help provide a "why" when motivation is waning. Declare, in writing, what you are unwilling to do,  for example "I am unwilling to be the old dad who cannot play sports with my children".  So consider what you're not willing to accept, write it down, and keep it at the ready.  3.  STOP LABELING FOOD "GOOD" AND "BAD"  You've probably heard someone say they ate something "bad." Maybe you've even said it yourself.  The trouble with 'bad' foods isn't that they'll send you to the grave after a bite or two. The trouble comes when we eat excessive portions of really calorie-dense foods meal after meal, day after day.  Instead of labeling foods as good or bad, think about which foods you can eat a lot of, and which ones you should just eat a little of. Then, plan ways to eat the foods you really like in portions that fit with your overall goals. A good example of this would be having a slice of pizza alongside a club salad with chicken breast, avocado and a bit of dressing. This is vastly different than 3 slices of pizza, 4 breadsticks with cheese sauce and half of a liter of regular soda.  4.  BRUSH YOUR TEETH AFTER YOU EAT  Getting your mindset in order is important, but sometimes small habits can make a big difference. After eating, you still have the taste of food in their mouth, which often causes people to eat more even if they are full or engage in a nibble or two of dessert.  Brushing your teeth will remove the taste of food from your mouth, and the clean, minty freshness will serve as a cue that mealtime is over.  5.  FOCUS ON CROWDING NOT CUTTING  The most common first step during 'dieting' is to cut. We cut our portion sizes down, we cut out 'bad' foods, we cut out entire food groups. This act of cutting puts [place] and our minds into scarcity mode.  When something is off-limits, even if you're able to avoid it for a while, you could end up bingeing on it later because you've gone so long without it. So, instead of cutting, focus on crowding. If you crowd your plate and fill it up with more  foods like veggies and protein, it simply allows less room for the other stuff. In other words, shift your focus away from what you can't eat, and celebrate the foods that will help you reach your goals.  6.  TAKE TRACKING A STEP FURTHER  Track what you eat, when you ate it, how much you ate and how that food made you feel. Being completely honest with yourself and writing  down every single thing that passes through your lips will help you start to notice that maybe you actually do snack, possibly take in more sugar than you thought, eat when you're bored rather than just hungry or maybe that you have a habit of snacking before bed while watching TV.  The difference from simply tracking your food intake is you're taking into account how food makes you feel, as well as what you're doing while you're eating. This is about becoming more mindful of what, when and why you eat.  7.  PRIORITIZE GOOD SLEEP  One of the strongest risk factors for being overweight is poor sleep. When you're feeling tired, you're more likely to choose unhealthy comfort foods and to skip your workout. Additionally, sleep deprivation may slow down your metabolism. Burnett KanarisYikes! Therefore, sleeping 7-8 hours per night can help with weight loss without having to change your diet or increase your physical activity. And if you feel you snore and still wake up tired, talk with me about sleep apnea.  8.  SET ASIDE TIME TO DISCONNECT  Just get out there. Disconnect from the electronics and connect to the elements. Not only will this help reduce stress (a major factor in weight gain) by giving your mind a break from the constant stimulation we've all become so accustomed to, but it may also reprogram your brain to connect with yourself and what you're feeling.

## 2019-08-16 LAB — COMPLETE METABOLIC PANEL WITH GFR
AG Ratio: 1.6 (calc) (ref 1.0–2.5)
ALT: 15 U/L (ref 6–29)
AST: 12 U/L (ref 10–35)
Albumin: 4.6 g/dL (ref 3.6–5.1)
Alkaline phosphatase (APISO): 110 U/L (ref 37–153)
BUN: 15 mg/dL (ref 7–25)
CO2: 23 mmol/L (ref 20–32)
Calcium: 9.7 mg/dL (ref 8.6–10.4)
Chloride: 102 mmol/L (ref 98–110)
Creat: 0.89 mg/dL (ref 0.50–0.99)
GFR, Est African American: 81 mL/min/{1.73_m2} (ref >=60)
GFR, Est Non African American: 70 mL/min/{1.73_m2} (ref >=60)
Globulin: 2.8 g/dL (calc) (ref 1.9–3.7)
Glucose, Bld: 203 mg/dL — ABNORMAL HIGH (ref 65–99)
Potassium: 4.1 mmol/L (ref 3.5–5.3)
Sodium: 139 mmol/L (ref 135–146)
Total Bilirubin: 0.4 mg/dL (ref 0.2–1.2)
Total Protein: 7.4 g/dL (ref 6.1–8.1)

## 2019-08-16 LAB — HEPATITIS C ANTIBODY
Hepatitis C Ab: NONREACTIVE
SIGNAL TO CUT-OFF: 0.01 (ref <1.00)

## 2019-08-16 LAB — CBC WITH DIFFERENTIAL/PLATELET
Absolute Monocytes: 564 cells/uL (ref 200–950)
Basophils Absolute: 50 cells/uL (ref 0–200)
Basophils Relative: 0.6 %
Eosinophils Absolute: 91 cells/uL (ref 15–500)
Eosinophils Relative: 1.1 %
HCT: 45.3 % — ABNORMAL HIGH (ref 35.0–45.0)
Hemoglobin: 15.5 g/dL (ref 11.7–15.5)
Lymphs Abs: 2166 cells/uL (ref 850–3900)
MCH: 31.4 pg (ref 27.0–33.0)
MCHC: 34.2 g/dL (ref 32.0–36.0)
MCV: 91.9 fL (ref 80.0–100.0)
MPV: 11 fL (ref 7.5–12.5)
Monocytes Relative: 6.8 %
Neutro Abs: 5428 cells/uL (ref 1500–7800)
Neutrophils Relative %: 65.4 %
Platelets: 240 10*3/uL (ref 140–400)
RBC: 4.93 10*6/uL (ref 3.80–5.10)
RDW: 12.4 % (ref 11.0–15.0)
Total Lymphocyte: 26.1 %
WBC: 8.3 10*3/uL (ref 3.8–10.8)

## 2019-08-16 LAB — LIPID PANEL
Cholesterol: 259 mg/dL — ABNORMAL HIGH (ref <200)
HDL: 36 mg/dL — ABNORMAL LOW (ref >=50)
Non-HDL Cholesterol (Calc): 223 mg/dL (calc) — ABNORMAL HIGH (ref <130)
Total CHOL/HDL Ratio: 7.2 (calc) — ABNORMAL HIGH (ref <5.0)
Triglycerides: 578 mg/dL — ABNORMAL HIGH (ref <150)

## 2019-08-16 LAB — MICROALBUMIN / CREATININE URINE RATIO
Creatinine, Urine: 44 mg/dL (ref 20–275)
Microalb Creat Ratio: 5 mcg/mg creat (ref <30)
Microalb, Ur: 0.2 mg/dL

## 2019-08-16 LAB — URINALYSIS, ROUTINE W REFLEX MICROSCOPIC
Bilirubin Urine: NEGATIVE
Hgb urine dipstick: NEGATIVE
Ketones, ur: NEGATIVE
Leukocytes,Ua: NEGATIVE
Nitrite: NEGATIVE
Protein, ur: NEGATIVE
Specific Gravity, Urine: 1.036 — ABNORMAL HIGH (ref 1.001–1.03)
pH: <=5 (ref 5.0–8.0)

## 2019-08-16 LAB — VITAMIN B12: Vitamin B-12: 340 pg/mL (ref 200–1100)

## 2019-08-16 LAB — MAGNESIUM: Magnesium: 2 mg/dL (ref 1.5–2.5)

## 2019-08-16 LAB — IRON, TOTAL/TOTAL IRON BINDING CAP
%SAT: 24 % (calc) (ref 16–45)
Iron: 85 ug/dL (ref 45–160)
TIBC: 352 mcg/dL (calc) (ref 250–450)

## 2019-08-16 LAB — TSH: TSH: 0.74 mIU/L (ref 0.40–4.50)

## 2019-08-16 LAB — VITAMIN D 25 HYDROXY (VIT D DEFICIENCY, FRACTURES): Vit D, 25-Hydroxy: 12 ng/mL — ABNORMAL LOW (ref 30–100)

## 2019-08-18 ENCOUNTER — Other Ambulatory Visit: Payer: Self-pay | Admitting: Adult Health

## 2019-10-12 LAB — HM DIABETES EYE EXAM

## 2019-10-13 ENCOUNTER — Encounter: Payer: Self-pay | Admitting: Internal Medicine

## 2019-11-16 ENCOUNTER — Encounter: Payer: Self-pay | Admitting: Internal Medicine

## 2019-11-16 ENCOUNTER — Other Ambulatory Visit: Payer: Self-pay | Admitting: Internal Medicine

## 2019-11-16 ENCOUNTER — Ambulatory Visit: Payer: 59 | Admitting: Internal Medicine

## 2019-11-16 ENCOUNTER — Other Ambulatory Visit: Payer: Self-pay

## 2019-11-16 VITALS — BP 118/70 | HR 88 | Ht 63.15 in | Wt 173.0 lb

## 2019-11-16 DIAGNOSIS — E669 Obesity, unspecified: Secondary | ICD-10-CM

## 2019-11-16 DIAGNOSIS — E1169 Type 2 diabetes mellitus with other specified complication: Secondary | ICD-10-CM

## 2019-11-16 DIAGNOSIS — E785 Hyperlipidemia, unspecified: Secondary | ICD-10-CM

## 2019-11-16 LAB — POCT GLYCOSYLATED HEMOGLOBIN (HGB A1C): Hemoglobin A1C: 8.5 % — AB (ref 4.0–5.6)

## 2019-11-16 MED ORDER — FARXIGA 5 MG PO TABS: 5.0000 mg | ORAL_TABLET | Freq: Every day | ORAL | 3 refills | Status: DC

## 2019-11-16 MED ORDER — GLIPIZIDE 5 MG PO TABS: ORAL_TABLET | ORAL | 3 refills | Status: DC

## 2019-11-16 NOTE — Patient Instructions (Addendum)
Please continue: - [name] 25 mg before breakfast - [name] 3 mg weekly  Please increase: - Glipizide to 10 mg in a.m. and 5 mg before dinner  Read the following book: Dr. Lequita Asal - Program for Reversing Diabetes  Please return in 3 months with your sugar log.

## 2019-11-16 NOTE — Addendum Note (Signed)
Addended by: Darliss Ridgel I on: 11/16/2019 11:23 AM   Modules accepted: Orders

## 2019-11-16 NOTE — Progress Notes (Signed)
Patient ID: Candice [name], female   DOB: [birth year], 64 y.o.   MRN: [id number]   This visit occurred during the SARS-CoV-2 public health emergency.  Safety protocols were in place, including screening questions prior to the visit, additional usage of staff PPE, and extensive cleaning of exam room while observing appropriate contact time as indicated for disinfecting solutions.   HPI: Candice [name] is a 64 y.o.-year-old female, returning for follow-up for DM2, dx in 2012-2013, non-insulin-dependent, uncontrolled, without long term complications.  Last visit 3 months ago. PCP: Liane Comber PA  Patient continues to have dietary indiscretions and reviewing her meter, sugars are higher at this visit.  Reviewed HbA1c levels: Lab Results  Component Value Date   HGBA1C 8.9 (A) 08/10/2019   HGBA1C 9.2 (A) 11/26/2018   HGBA1C 7.7 (A) 08/16/2018   Pt is on a regimen of: - Glipizide 5 mg before dinner >> 5 mg twice a day now most before meals - [name] 25 mg before breakfast - [name] 1.5 mg weekly -started 02/2018 >> 3 mg weekly in 07/2019 We retried Metformin ER 500 mg daily with dinner in 08/2018-she had nausea and AP and had to stop Used [place] in the past >> not covered anymore. No yeast inf's. She was on Glipizide 5 mg 2x a day before meals She was on Victoza >> tolerated it well.  She is checking sugars once a day: - am: n/c >> 110, 150-165, 200 >> ? >> 130-180 >> 171-228 - 2h after b'fast: 220 >> n/c - before lunch: n/c >> 112, 157-197, 226 - 2h after lunch: n/c - before dinner: n/c >> 160-181 - 2h after dinner: n/c - bedtime: n/c >> 180-190s >> n/c - nighttime: n/c Lowest sugar was 110 ...>> 130 >> 112 ; it is unclear at which CBG level she has hypoglycemia awareness Highest sugar was 285 ...>> 180 >> 298.  Glucometer: none  Pt's meals are: - Breakfast: PB crackers, fruit, sausage McMuffin at 10 am - Lunch: chicken salad wraps, sandwiches, baked chips - Dinner:  veggies, meats, salad - Snacks: crackers  No CKD, last BUN/creatinine:  Lab Results  Component Value Date   BUN 15 08/15/2019   BUN 15 09/16/2018   CREATININE 0.89 08/15/2019   CREATININE 0.72 09/16/2018   + HL; last set of lipids: Lab Results  Component Value Date   CHOL 259 (H) 08/15/2019   HDL 36 (L) 08/15/2019   LDLCALC  08/15/2019     Comment:     . LDL cholesterol not calculated. Triglyceride levels greater than 400 mg/dL invalidate calculated LDL results. . Reference range: <100 . Desirable range <100 mg/dL for primary prevention;   <70 mg/dL for patients with CHD or diabetic patients  with > or = 2 CHD risk factors. Marland Kitchen LDL-C is now calculated using the Martin-Hopkins  calculation, which is a validated novel method providing  better accuracy than the Friedewald equation in the  estimation of LDL-C.  Cresenciano Genre et al. Annamaria Helling. 9381;829(93): 2061-2068  (http://education.[facility].com/faq/FAQ164)    TRIG 578 (H) 08/15/2019   CHOLHDL 7.2 (H) 08/15/2019  On Crestor 20 >> now taking it consistently.  - last eye exam was in 09/2019: No DR, + cataract  She denies numbness and tingling in her feet.  On ASA 81.  Pt has FH of DM in sister.  She has a history of shoulder bursitis.  ROS: Constitutional: no weight gain/no weight loss, no fatigue, no subjective hyperthermia, no subjective hypothermia Eyes: no blurry vision,  no xerophthalmia ENT: no sore throat, no nodules palpated in neck, no dysphagia, no odynophagia, no hoarseness Cardiovascular: no CP/no SOB/no palpitations/no leg swelling Respiratory: no cough/no SOB/no wheezing Gastrointestinal: no N/no V/no D/no C/no acid reflux Musculoskeletal: no muscle aches/no joint aches Skin: no rashes, no hair loss Neurological: no tremors/no numbness/no tingling/no dizziness  I reviewed pt's medications, allergies, PMH, social hx, family hx, and changes were documented in the history of present illness. Otherwise,  unchanged from my initial visit note.  Past Medical History:  Diagnosis Date  . Anxiety   . Diabetes mellitus without complication (HCC)   . Hx of colonic polyps    Fhx Colon cancer  . Hyperlipidemia   . Hypocholesteremia   . Tobacco abuse    Past Surgical History:  Procedure Laterality Date  . APPENDECTOMY    . CESAREAN SECTION    . CHOLECYSTECTOMY    . TONSILLECTOMY     Social History   Socioeconomic History  . Marital status: Married    Spouse name: Not on file  . Number of children: 1  . Years of education: Not on file  . Highest education level: Not on file  Occupational History  .  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 Financial resource strain: Not on file  . Food insecurity:    Worry: Not on file    Inability: Not on file  . Transportation needs:    Medical: Not on file    Non-medical: Not on file  Tobacco Use  . Smoking status: Former Smoker    Packs/day: 0.50    Years: 30.00    Pack years: 15.00    Types: Cigarettes    Last attempt to quit: 10/15/2017    Years since quitting: 0.4  . Smokeless tobacco: Never Used  Substance and Sexual Activity  . Alcohol use: No    Alcohol/week: 0.0 oz  . Drug use: No   Current Outpatient Medications on File Prior to Visit  Medication Sig Dispense Refill  . aspirin EC 81 MG tablet Take 81 mg by mouth daily.    . chlorpheniramine (CHLOR-TRIMETON) 4 MG tablet Take 2 tablets (8 mg total) by mouth 3 (three) times daily. (Patient not taking: Reported on 08/15/2019) 190 tablet 0  . Cholecalciferol (VITAMIN D3) 2000 units TABS Take 4,000 Units by mouth.    . citalopram (CELEXA) 40 MG tablet TAKE 1 TABLET EACH DAY. 90 tablet 0  . Dulaglutide ([name]) 3 MG/0.5ML SOPN Inject 3 mg into the skin once a week. 4 pen 11  . empagliflozin ([name]) 25 MG TABS tablet Take 25 mg by mouth daily. 90 tablet 3  . glipiZIDE (GLUCOTROL) 5 MG tablet Take 1 tablet (5 mg total) by mouth 2 (two) times daily before a meal. 180  tablet 3  . glucose blood (ONETOUCH VERIO) test strip Use 2x a day 200 each 3  . meclizine (ANTIVERT) 25 MG tablet 1/2-1 pill up to 3 times daily for motion sickness/dizziness (Patient taking differently: as needed. 1/2-1 pill up to 3 times daily for motion sickness/dizziness) 30 tablet 0  . meloxicam (MOBIC) 15 MG tablet Take one daily with food for 2 weeks, can take with tylenol, can not take with aleve, iburpofen, then as needed daily for pain 30 tablet 1  . montelukast (SINGULAIR) 10 MG tablet TAKE ONE TABLET AT BEDTIME. 90 tablet 3  . OneTouch Delica Lancets 33G MISC Check 2x a day 200 each 3  . OVER THE COUNTER MEDICATION  Takes an OTC Allertec    . rosuvastatin (CRESTOR) 40 MG tablet TAKE ONE-HALF TO ONE TABLET EACH EVENING. 90 tablet 0  . terconazole (TERAZOL 7) 0.4 % vaginal cream Place 1 applicator vaginally at bedtime. (Patient taking differently: Place 1 applicator vaginally as needed. ) 45 g 2   No current facility-administered medications on file prior to visit.   Allergies  Allergen Reactions  . Levaquin [Levofloxacin] Hives and Other (See Comments)    Tingling sensation to mouth, near LOC, blurred vision,    . Iodine     Unknown reaction   . Levofloxacin In D5w Hives    Hives  . Metformin And Related Diarrhea  . Shellfish Allergy     Unknown reaction   . Vitamin D Analogs Diarrhea    High dose Vitamin D caused diarrhea  . Amoxicillin Rash  . Eggs Or Egg-Derived Products     Low, by skin test only, local itching when injected   . Kombiglyze [Saxagliptin-Metformin Er] Other (See Comments)    Bloating  . Latex Itching and Rash  . Penicillins Rash   Family History  Problem Relation Age of Onset  . AAA (abdominal aortic aneurysm) Mother   . Hyperlipidemia Mother   . Heart attack Brother 55  . Brain cancer Maternal Grandmother 45  . Cancer Sister        Colon  . Diabetes Sister   . Heart disease Sister   . Heart attack Maternal Grandfather     PE: Ht 5' 3.15"  (1.604 m)   Wt 173 lb (78.5 kg)   BMI 30.50 kg/m  Wt Readings from Last 3 Encounters:  11/16/19 173 lb (78.5 kg)  08/15/19 176 lb (79.8 kg)  08/10/19 174 lb (78.9 kg)   Constitutional: overweight, in NAD Eyes: PERRLA, EOMI, no exophthalmos ENT: moist mucous membranes, no thyromegaly, no cervical lymphadenopathy Cardiovascular: RRR, No MRG Respiratory: CTA B Gastrointestinal: abdomen soft, NT, ND, BS+ Musculoskeletal: no deformities, strength intact in all 4 Skin: moist, warm, no rashes Neurological: no tremor with outstretched hands, DTR normal in all 4  ASSESSMENT: 1. DM2, non-insulin-dependent, uncontrolled, without long term complications, but with hyperglycemia  2.  Obesity class I  3. HL  PLAN:  1. Patient with longstanding, uncontrolled, type 2 diabetes, on oral antidiabetic regimen and weekly GLP-1 receptor agonist.  We cannot use Metformin ER due to abdominal pain and diarrhea.  Before last visit, she was not checking sugars consistently missing [name] doses.  At last visit, I advised her to check at different times of the day as she was only checking in the morning and, since sugars are still above goal, we increased her [name] dose.  I also advised her to use glipizide before meals as she was taking this after meals.  She started to improve her diet and I strongly advised her to continue. -At this visit, sugars are higher.  Patient tells me that she is not eating a good diet and that she knows what she needs to do but she cannot make herself do it.  She refuses a referral to nutrition at this visit.  We discussed that based on the sugars, we need to add insulin, but she would want to wait she can bring the sugars down by herself.  For now, we will increase her morning glipizide to 10 mg.  However, I feel that this is just temporizing, I do not think we can actually avoid starting insulin at next visit - I suggested to:  Patient Instructions  Please continue: -  [name] 25 mg before breakfast - [name] 3 mg weekly  Please increase: - Glipizide to 10 mg in a.m. and 5 mg before dinner  Read the following book: Dr. Lequita Asal - Program for Reversing Diabetes  Please return in 3 months with your sugar log.   - we checked her HbA1c: 8.5% (slightly lower) - advised to check sugars at different times of the day - 1x a day, rotating check times - advised for yearly eye exams >> she is not UTD - return to clinic in 3 months   2.  Obesity class I -Continue [name] and [name] as they should also help with weight loss -Improving diet should also help - discussed about dietary changes: Cutting down fatty foods  3. HL -Reviewed latest lipid panel from 08/2019: Triglycerides very high so LDL could not be calculated.  HDL low: Lab Results  Component Value Date   CHOL 259 (H) 08/15/2019   HDL 36 (L) 08/15/2019   LDLCALC  08/15/2019     Comment:     . LDL cholesterol not calculated. Triglyceride levels greater than 400 mg/dL invalidate calculated LDL results. . Reference range: <100 . Desirable range <100 mg/dL for primary prevention;   <70 mg/dL for patients with CHD or diabetic patients  with > or = 2 CHD risk factors. Marland Kitchen LDL-C is now calculated using the Martin-Hopkins  calculation, which is a validated novel method providing  better accuracy than the Friedewald equation in the  estimation of LDL-C.  Horald Pollen et al. Lenox Ahr. 0109;323(55): 2061-2068  (http://education.[facility].com/faq/FAQ164)    TRIG 578 (H) 08/15/2019   CHOLHDL 7.2 (H) 08/15/2019  -Continues Crestor without side effects  Carlus Pavlov, MD PhD [facility] Endocrinology

## 2019-11-17 ENCOUNTER — Other Ambulatory Visit: Payer: Self-pay

## 2019-11-17 MED ORDER — GLIPIZIDE 5 MG PO TABS: ORAL_TABLET | ORAL | 3 refills | Status: DC

## 2019-11-28 NOTE — Progress Notes (Signed)
FOLLOW UP  Assessment and Plan:   Hypertension At goal off of medications at this time; ACE/ARBi discussed for T2DM renal protections but declines at this time, would consider in the future Monitor blood pressure at home; call if consistently over 130/80 Continue DASH diet.   Reminder to go to the ER if any CP, SOB, nausea, dizziness, severe HA, changes vision/speech, left arm numbness and tingling and jaw pain.  Cholesterol Currently above goal; taking rosuvastatin 40 mg daily since last visit Continue low cholesterol diet and exercise.  Check lipid panel.   Diabetes without complications Now managed by Dr. Elvera Lennox Continue medication: jardiance, trulicity, glipizide 10 mg AM, 5 mg PM  Intolerance of metformin Continue diet and exercise.  Perform daily foot/skin check, notify office of any concerning changes.  Defer A1C to Dr. Charlean Sanfilippo office  Obesity with co morbidities Long discussion about weight loss, diet, and exercise Recommended diet heavy in fruits and veggies and low in animal meats, cheeses, and dairy products, appropriate calorie intake Discussed ideal weight for height Patient will work on resuming previous diet, start exercising Will follow up in 3 months  Vitamin D Def Very low at last visit; has now started supplementation  Check Vit D level, continue to counsel   Anxiety Doing well with Celexa 20 mg  Stress management techniques discussed, increase water, good sleep hygiene discussed, increase exercise, and increase veggies.   Former smoker Doing well with cessation since Jan 2019; seeing pulmonology and getting PFTs  Continue diet and meds as discussed. Further disposition pending results of labs. Discussed med's effects and SE's.   Over 30 minutes of exam, counseling, chart review, and critical decision making was performed.   Future Appointments  Date Time Provider Department Center  02/15/2020 10:40 AM Carlus Pavlov, MD [id number] None   02/29/2020 10:45 AM Judd Gaudier, NP [facility] None  08/29/2020 10:00 AM Judd Gaudier, NP [facility] None   ----------------------------------------------------------------------------------------------------------------------  HPI 64 y.o. female  presents for 3 month follow up on blood pressure, cholesterol, T2 diabetes, obesity and vitamin D deficiency.   She reported increased stress related to her daughter going through a divorce and moving in, CELEXA dose 20 mg and doing well.   BMI is Body mass index is 30.54 kg/m., she has not been working on diet and exercise. She is walking 20 min 4 days a week, trying to increase.  She also plans to improve her diet, has been going to salad restaurant for lunch. Trying to reduce carbs, less potatoes, rice, doing riced cauliflower She drinks water throughout the day after her 1 coffee in the AM.  Wt Readings from Last 3 Encounters:  11/30/19 173 lb 3.2 oz (78.6 kg)  11/16/19 173 lb (78.5 kg)  08/15/19 176 lb (79.8 kg)   Today their BP is BP: 120/70 Declines ACEi/ARB for now, low BPs at home.   She does workout. She denies chest pain, shortness of breath, dizziness.   She is on cholesterol medication (rosuvastatin 40 mg daily since last visit, was off of med at last check) and denies myalgias. Her cholesterol is not at goal. The cholesterol last visit was:   Lab Results  Component Value Date   CHOL 259 (H) 08/15/2019   HDL 36 (L) 08/15/2019   LDLCALC  08/15/2019     Comment:     . LDL cholesterol not calculated. Triglyceride levels greater than 400 mg/dL invalidate calculated LDL results. . Reference range: <100 . Desirable range <100 mg/dL for primary prevention;   <  70 mg/dL for patients with CHD or diabetic patients  with > or = 2 CHD risk factors. Marland Kitchen LDL-C is now calculated using the Martin-Hopkins  calculation, which is a validated novel method providing  better accuracy than the Friedewald equation in the  estimation of  LDL-C.  Cresenciano Genre et al. Annamaria Helling. 1287;867(67): 2061-2068  (http://education.[facility].com/faq/FAQ164)    TRIG 578 (H) 08/15/2019   CHOLHDL 7.2 (H) 08/15/2019    She has not been working on diet and exercise for T2 diabetes ([name] 25 mg, trulicity recently increased to 3 mg weekly, glipizide 10 mg AM, 5 mg PM, hx of metformin intolerance), now following with Dr. Cruzita Lederer as well, and denies hypoglycemia , increased appetite, nausea, paresthesia of the feet, polydipsia, polyuria, visual disturbances and vomiting. She has been checking AM sugars regularly, reports fasting ranging 150-170. Does not check postprandial. 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 aspirin EC 81 MG tablet Take 81 mg by mouth daily.  . citalopram (CELEXA) 40 MG tablet TAKE 1 TABLET EACH DAY.  . Dulaglutide ([name]) 3 MC/9.4BS SOPN Inject 3 mg into the skin once a week.  . empagliflozin ([name]) 25 MG TABS tablet Take 25 mg by mouth daily.  Marland Kitchen glipiZIDE (GLUCOTROL) 5 MG tablet Take 10 mg before breakfast and 5 mg before midnight  . glucose blood (ONETOUCH VERIO) test strip Use 2x a day  . meclizine (ANTIVERT) 25 MG tablet 1/2-1 pill up to 3 times daily for motion sickness/dizziness (Patient taking differently: as needed. 1/2-1 pill up to 3 times daily for motion sickness/dizziness)  . montelukast (SINGULAIR) 10 MG tablet TAKE ONE TABLET AT BEDTIME.  Glory Rosebush Delica Lancets  96G MISC Check 2x a day  . OVER THE COUNTER MEDICATION Takes an OTC Allertec  . rosuvastatin (CRESTOR) 40 MG tablet TAKE ONE-HALF TO ONE TABLET EACH EVENING.  Marland Kitchen terconazole (TERAZOL 7) 0.4 % vaginal cream Place 1 applicator vaginally at bedtime. (Patient taking differently: Place 1 applicator vaginally as needed. )  . Cholecalciferol (VITAMIN D3) 2000 units TABS Take 4,000 Units by mouth.  . meloxicam (MOBIC) 15 MG tablet Take one daily with food for 2 weeks, can take with tylenol, can not take with aleve, iburpofen, then as needed daily for pain (Patient not taking: Reported on 11/30/2019)   No current facility-administered medications on file prior to visit.     Allergies:  Allergies  Allergen Reactions  . Levaquin [Levofloxacin] Hives and Other (See Comments)    Tingling sensation to mouth, near LOC, blurred vision,    . Iodine     Unknown reaction   . Levofloxacin In D5w Hives    Hives  . Metformin And Related Diarrhea  . Shellfish Allergy     Unknown reaction   . Vitamin D Analogs Diarrhea    High dose Vitamin D caused diarrhea  . Amoxicillin Rash  . Eggs  Or Egg-Derived Products     Low, by skin test only, local itching when injected   . Kombiglyze [Saxagliptin-Metformin Er] Other (See Comments)    Bloating  . Latex Itching and Rash  . Penicillins Rash     Medical History:  Past Medical History:  Diagnosis Date  . Anxiety   . Diabetes mellitus without complication (HCC)   . Hx of colonic polyps    Fhx Colon cancer  . Hyperlipidemia   . Hypocholesteremia   . Tobacco abuse    Family history- Reviewed and unchanged Social history- Reviewed and unchanged   Review of Systems:  Review of Systems  Constitutional: Negative for malaise/fatigue and weight loss.  HENT: Negative for hearing loss, sore throat and tinnitus.   Eyes: Negative for blurred vision and double vision.  Respiratory: Negative for cough, sputum production, shortness of breath and wheezing.    Cardiovascular: Negative for chest pain, palpitations, orthopnea, claudication and leg swelling.  Gastrointestinal: Negative for abdominal pain, blood in stool, constipation, diarrhea, heartburn, melena, nausea and vomiting.  Genitourinary: Negative.   Musculoskeletal: Negative for joint pain, myalgias and neck pain.  Skin: Negative for rash.  Neurological: Negative for dizziness, tingling, sensory change, focal weakness, weakness and headaches.  Endo/Heme/Allergies: Negative for polydipsia.  Psychiatric/Behavioral: Negative for depression and substance abuse. The patient is not nervous/anxious and does not have insomnia.   All other systems reviewed and are negative.   Physical Exam: BP 120/70   Pulse 87   Temp (!) 97.5 F (36.4 C)   Wt 173 lb 3.2 oz (78.6 kg)   SpO2 98%   BMI 30.54 kg/m  Wt Readings from Last 3 Encounters:  11/30/19 173 lb 3.2 oz (78.6 kg)  11/16/19 173 lb (78.5 kg)  08/15/19 176 lb (79.8 kg)   General Appearance: Well nourished, in no apparent distress. Eyes: PERRLA, EOMs, conjunctiva no swelling or erythema Sinuses: No Frontal/maxillary tenderness ENT/Mouth: Ext aud canals clear, TMs without erythema, bulging. No erythema, swelling, or exudate on post pharynx.  Tonsils not swollen or erythematous. Hearing normal.  Neck: Supple, thyroid normal.  Respiratory: Respiratory effort normal, BS equal bilaterally without rales, rhonchi, wheezing or stridor.  Cardio: RRR with no MRGs. Brisk peripheral pulses without edema.  Abdomen: Soft, + BS.  Non tender, no guarding, rebound, hernias, masses. Lymphatics: Non tender without lymphadenopathy.  Musculoskeletal: Full active ROM intact, upper extremity strength somewhat limited by pain with internal rotation of shoulder, with triceps flexion and with internal shoulder rotation, reported palpable lump is not present though with tenderness over lateral triceps. Normal gait.  Skin: Warm, dry without rashes, lesions,  ecchymosis.  Neuro: Cranial nerves intact. No cerebellar symptoms. Sensation intact Psych: Awake and oriented X 3, normal affect, Insight and Judgment appropriate.    Dan Maker, NP 10:57 AM [facility]

## 2019-11-30 ENCOUNTER — Ambulatory Visit: Payer: 59 | Admitting: Adult Health

## 2019-11-30 ENCOUNTER — Other Ambulatory Visit: Payer: Self-pay

## 2019-11-30 ENCOUNTER — Encounter: Payer: Self-pay | Admitting: Adult Health

## 2019-11-30 VITALS — BP 120/70 | HR 87 | Temp 97.5°F | Wt 173.2 lb

## 2019-11-30 DIAGNOSIS — Z79899 Other long term (current) drug therapy: Secondary | ICD-10-CM

## 2019-11-30 DIAGNOSIS — E1169 Type 2 diabetes mellitus with other specified complication: Secondary | ICD-10-CM

## 2019-11-30 DIAGNOSIS — E1122 Type 2 diabetes mellitus with diabetic chronic kidney disease: Secondary | ICD-10-CM | POA: Diagnosis not present

## 2019-11-30 DIAGNOSIS — E785 Hyperlipidemia, unspecified: Secondary | ICD-10-CM

## 2019-11-30 DIAGNOSIS — I10 Essential (primary) hypertension: Secondary | ICD-10-CM | POA: Diagnosis not present

## 2019-11-30 DIAGNOSIS — E669 Obesity, unspecified: Secondary | ICD-10-CM

## 2019-11-30 DIAGNOSIS — N182 Chronic kidney disease, stage 2 (mild): Secondary | ICD-10-CM

## 2019-11-30 DIAGNOSIS — E559 Vitamin D deficiency, unspecified: Secondary | ICD-10-CM

## 2019-11-30 DIAGNOSIS — F411 Generalized anxiety disorder: Secondary | ICD-10-CM

## 2019-11-30 NOTE — Patient Instructions (Addendum)
Goals    . Exercise 150 min/wk Moderate Activity    . Fasting Blood Glucose <150    . HEMOGLOBIN A1C < 7.0    . LDL CALC < 70    . Weight (lb) < 160 lb (72.6 kg)       Try getting on Omega 3 supplement - flax seed, etc       High Triglycerides Eating Plan Triglycerides are a type of fat in the blood. High levels of triglycerides can increase your risk of heart disease and stroke. If your triglyceride levels are high, choosing the right foods can help lower your triglycerides and keep your heart healthy. Work with your health care provider or a diet and nutrition specialist (dietitian) to develop an eating plan that is right for you. What are tips for following this plan? General guidelines   Lose weight, if you are overweight. For most people, losing 5-10 lbs (2-5 kg) helps lower triglyceride levels. A weight-loss plan may include. ? 30 minutes of exercise at least 5 days a week. ? Reducing the amount of calories, sugar, and fat you eat.  Eat a wide variety of fresh fruits, vegetables, and whole grains. These foods are high in fiber.  Eat foods that contain healthy fats, such as fatty fish, nuts, seeds, and olive oil.  Avoid foods that are high in added sugar, added salt (sodium), saturated fat, and trans fat.  Avoid low-fiber, refined carbohydrates such as white bread, crackers, noodles, and white rice.  Avoid foods with partially hydrogenated oils (trans fats), such as fried foods or stick margarine.  Limit alcohol intake to no more than 1 drink a day for nonpregnant women and 2 drinks a day for men. One drink equals 12 oz of beer, 5 oz of wine, or 1 oz of hard liquor. Your health care provider may recommend that you drink less depending on your overall health. Reading food labels  Check food labels for the amount of saturated fat. Choose foods with no or very little saturated fat.  Check food labels for the amount of trans fat. Choose foods with no trans fat.  Check food  labels for the amount of cholesterol. Choose foods low in cholesterol. Ask your dietitian how much cholesterol you should have each day.  Check food labels for the amount of sodium. Choose foods with less than 140 milligrams (mg) per serving. Shopping  Buy dairy products labeled as nonfat (skim) or low-fat (1%).  Avoid buying processed or prepackaged foods. These are often high in added sugar, sodium, and fat. Cooking  Choose healthy fats when cooking, such as olive oil or canola oil.  Cook foods using lower fat methods, such as baking, broiling, boiling, or grilling.  Make your own sauces, dressings, and marinades when possible, instead of buying them. Store-bought sauces, dressings, and marinades are often high in sodium and sugar. Meal planning  Eat more home-cooked food and less restaurant, buffet, and fast food.  Eat fatty fish at least 2 times each week. Examples of fatty fish include salmon, trout, mackerel, tuna, and herring.  If you eat whole eggs, do not eat more than 3 egg yolks per week. What foods are recommended? The items listed may not be a complete list. Talk with your dietitian about what dietary choices are best for you. Grains Whole wheat or whole grain breads, crackers, cereals, and pasta. Unsweetened oatmeal. Bulgur. Barley. Quinoa. Brown rice. Whole wheat flour tortillas. Vegetables Fresh or frozen vegetables. Low-sodium canned vegetables. Fruits  All fresh, canned (in natural juice), or frozen fruits. Meats and other protein foods Skinless chicken or [place]. Ground chicken or [place]. Lean cuts of pork, trimmed of fat. Fish and seafood, especially salmon, trout, and herring. Egg whites. Dried beans, peas, or lentils. Unsalted nuts or seeds. Unsalted canned beans. Natural peanut or almond butter. Dairy Low-fat dairy products. Skim or low-fat (1%) milk. Reduced fat (2%) and low-sodium cheese. Low-fat ricotta cheese. Low-fat cottage cheese. Plain, low-fat  yogurt. Fats and oils Tub margarine without trans fats. Light or reduced-fat mayonnaise. Light or reduced-fat salad dressings. Avocado. Safflower, olive, sunflower, soybean, and canola oils. What foods are not recommended? The items listed may not be a complete list. Talk with your dietitian about what dietary choices are best for you. Grains White bread. White (regular) pasta. White rice. Cornbread. Bagels. Pastries. Crackers that contain trans fat. Vegetables Creamed or fried vegetables. Vegetables in a cheese sauce. Fruits Sweetened dried fruit. Canned fruit in syrup. Fruit juice. Meats and other protein foods Fatty cuts of meat. Ribs. Chicken wings. Berniece Salines. Sausage. Bologna. Salami. Chitterlings. Fatback. Hot dogs. Bratwurst. Packaged lunch meats. Dairy Whole or reduced-fat (2%) milk. Half-and-half. Cream cheese. Full-fat or sweetened yogurt. Full-fat cheese. Nondairy creamers. Whipped toppings. Processed cheese or cheese spreads. Cheese curds. Beverages Alcohol. Sweetened drinks, such as soda, lemonade, fruit drinks, or punches. Fats and oils Butter. Stick margarine. Lard. Shortening. Ghee. Bacon fat. Tropical oils, such as coconut, palm kernel, or palm oils. Sweets and desserts Corn syrup. Sugars. Honey. Molasses. Candy. Jam and jelly. Syrup. Sweetened cereals. Cookies. Pies. Cakes. Donuts. Muffins. Ice cream. Condiments Store-bought sauces, dressings, and marinades that are high in sugar, such as ketchup and barbecue sauce. Summary  High levels of triglycerides can increase the risk of heart disease and stroke. Choosing the right foods can help lower your triglycerides.  Eat plenty of fresh fruits, vegetables, and whole grains. Choose low-fat dairy and lean meats. Eat fatty fish at least twice a week.  Avoid processed and prepackaged foods with added sugar, sodium, saturated fat, and trans fat.  If you need suggestions or have questions about what types of food are good for you,  talk with your health care provider or a dietitian. This information is not intended to replace advice given to you by your health care provider. Make sure you discuss any questions you have with your health care provider. Document Revised: 09/11/2017 Document Reviewed: 12/02/2016 Elsevier Patient Education  2020 [employer].

## 2019-12-01 LAB — COMPLETE METABOLIC PANEL WITH GFR
AG Ratio: 2.1 (calc) (ref 1.0–2.5)
ALT: 24 U/L (ref 6–29)
AST: 19 U/L (ref 10–35)
Albumin: 4.8 g/dL (ref 3.6–5.1)
Alkaline phosphatase (APISO): 106 U/L (ref 37–153)
BUN: 13 mg/dL (ref 7–25)
CO2: 26 mmol/L (ref 20–32)
Calcium: 10 mg/dL (ref 8.6–10.4)
Chloride: 106 mmol/L (ref 98–110)
Creat: 0.72 mg/dL (ref 0.50–0.99)
GFR, Est African American: 105 mL/min/{1.73_m2} (ref >=60)
GFR, Est Non African American: 90 mL/min/{1.73_m2} (ref >=60)
Globulin: 2.3 g/dL (calc) (ref 1.9–3.7)
Glucose, Bld: 137 mg/dL — ABNORMAL HIGH (ref 65–99)
Potassium: 5 mmol/L (ref 3.5–5.3)
Sodium: 142 mmol/L (ref 135–146)
Total Bilirubin: 0.6 mg/dL (ref 0.2–1.2)
Total Protein: 7.1 g/dL (ref 6.1–8.1)

## 2019-12-01 LAB — CBC WITH DIFFERENTIAL/PLATELET
Absolute Monocytes: 460 cells/uL (ref 200–950)
Basophils Absolute: 38 cells/uL (ref 0–200)
Basophils Relative: 0.6 %
Eosinophils Absolute: 57 cells/uL (ref 15–500)
Eosinophils Relative: 0.9 %
HCT: 47.7 % — ABNORMAL HIGH (ref 35.0–45.0)
Hemoglobin: 16.1 g/dL — ABNORMAL HIGH (ref 11.7–15.5)
Lymphs Abs: 1550 cells/uL (ref 850–3900)
MCH: 31 pg (ref 27.0–33.0)
MCHC: 33.8 g/dL (ref 32.0–36.0)
MCV: 91.7 fL (ref 80.0–100.0)
MPV: 10.8 fL (ref 7.5–12.5)
Monocytes Relative: 7.3 %
Neutro Abs: 4196 cells/uL (ref 1500–7800)
Neutrophils Relative %: 66.6 %
Platelets: 232 10*3/uL (ref 140–400)
RBC: 5.2 10*6/uL — ABNORMAL HIGH (ref 3.80–5.10)
RDW: 12.2 % (ref 11.0–15.0)
Total Lymphocyte: 24.6 %
WBC: 6.3 10*3/uL (ref 3.8–10.8)

## 2019-12-01 LAB — LIPID PANEL
Cholesterol: 122 mg/dL (ref <200)
HDL: 38 mg/dL — ABNORMAL LOW (ref >=50)
LDL Cholesterol (Calc): 58 mg/dL (calc)
Non-HDL Cholesterol (Calc): 84 mg/dL (calc) (ref <130)
Total CHOL/HDL Ratio: 3.2 (calc) (ref <5.0)
Triglycerides: 190 mg/dL — ABNORMAL HIGH (ref <150)

## 2019-12-01 LAB — MAGNESIUM: Magnesium: 2.2 mg/dL (ref 1.5–2.5)

## 2019-12-01 LAB — TSH: TSH: 0.87 mIU/L (ref 0.40–4.50)

## 2019-12-21 ENCOUNTER — Encounter: Payer: Self-pay | Admitting: Adult Health

## 2020-01-10 ENCOUNTER — Other Ambulatory Visit: Payer: Self-pay | Admitting: Adult Health

## 2020-02-15 ENCOUNTER — Other Ambulatory Visit: Payer: Self-pay | Admitting: Physician Assistant

## 2020-02-15 ENCOUNTER — Ambulatory Visit: Payer: 59 | Admitting: Internal Medicine

## 2020-02-28 ENCOUNTER — Ambulatory Visit: Payer: 59 | Admitting: Adult Health

## 2020-02-29 ENCOUNTER — Ambulatory Visit: Payer: 59 | Admitting: Adult Health

## 2020-03-26 ENCOUNTER — Other Ambulatory Visit: Payer: Self-pay

## 2020-03-26 ENCOUNTER — Encounter: Payer: Self-pay | Admitting: Internal Medicine

## 2020-03-26 ENCOUNTER — Ambulatory Visit: Payer: 59 | Admitting: Internal Medicine

## 2020-03-26 VITALS — BP 90/60 | HR 76 | Temp 98.4°F | Ht 63.25 in | Wt 174.0 lb

## 2020-03-26 DIAGNOSIS — R300 Dysuria: Secondary | ICD-10-CM

## 2020-03-26 DIAGNOSIS — R350 Frequency of micturition: Secondary | ICD-10-CM | POA: Diagnosis not present

## 2020-03-26 LAB — POCT URINALYSIS DIPSTICK
Appearance: NEGATIVE
Bilirubin, UA: NEGATIVE
Blood, UA: NEGATIVE
Glucose, UA: POSITIVE — AB
Ketones, UA: NEGATIVE
Leukocytes, UA: NEGATIVE
Nitrite, UA: NEGATIVE
Odor: NEGATIVE
Protein, UA: NEGATIVE
Spec Grav, UA: 1.015 (ref 1.010–1.025)
Urobilinogen, UA: 0.2 E.U./dL
pH, UA: 5 (ref 5.0–8.0)

## 2020-03-26 MED ORDER — DOXYCYCLINE HYCLATE 100 MG PO TABS: 100.0000 mg | ORAL_TABLET | Freq: Two times a day (BID) | ORAL | 0 refills | Status: DC

## 2020-03-26 NOTE — Progress Notes (Signed)
   Subjective:    [name] ID: Candice [name], female    DOB: 07-Nov-1955, 64 y.o.   MRN: [id number]  HPI 64 year old Female seen for complaint of right CVA pain and right lower abdomen discomfort with dysuria onset Saturday June 12. No fever or chills. Pt is a diabetic. No nausea or vomiting.    Review of Systems see above     Objective:   Physical Exam  Afebrile VS reviewed. No CVA tenderness and urine dipstirck is normal. Culture sent.      Assessment & Plan:  Symptoms are c/w acute UTI but dispstick is WNL. Culture sent. Treat with Doxycycline 100 mg twice daily x 7days.  Addendum: Urine culture showed less than 10,000 colony-forming units per cc gram-positive organism.  [name] feeling better with doxycycline.  Continue course of doxycycline prescribed to completion.

## 2020-03-27 LAB — URINE CULTURE
MICRO NUMBER:: 10587474
SPECIMEN QUALITY:: ADEQUATE

## 2020-03-28 ENCOUNTER — Ambulatory Visit: Payer: 59 | Admitting: Adult Health

## 2020-04-08 NOTE — Patient Instructions (Signed)
Take doxycycline 100 mg twice daily for 7 days.  Urine culture has been sent

## 2020-04-09 NOTE — Progress Notes (Signed)
FOLLOW UP  Assessment and Plan:   Cholesterol Has been at goal with rosuvastatin 40 mg daily but admits has been out of med x 3 weeks Will send in 1 year supply to encourage consistency Continue low cholesterol diet and exercise.  Defer lipid panel today per patient preference   Diabetes with diabetic chronic kidney disease Continue medication: jardiance, trulicity 3 mg weekly, glipizide 10 mg AM, 5 mg PM  Intolerance of metformin Continue diet and exercise.  Perform daily foot/skin check, notify office of any concerning changes.  Defer A1C to Dr. Charlean Sanfilippo office  Obesity with co morbidities Long discussion about weight loss, diet, and exercise Recommended diet heavy in fruits and veggies and low in animal meats, cheeses, and dairy products, appropriate calorie intake Discussed ideal weight for height Patient will work on consistency with diet, increase exercise, reduce diet soda, increase water Will follow up in 3 months  Vitamin D Def Very low at last visit; has now started supplementation  Check Vit D level, continue to counsel   Anxiety Doing well with Celexa 20 mg  Stress management techniques discussed, increase water, good sleep hygiene discussed, increase exercise, and increase veggies.   Former smoker Doing well with cessation since Jan 2019; seeing pulmonology and getting PFTs  Continue diet and meds as discussed. Further disposition pending results of labs. Discussed med's effects and SE's.   Over 30 minutes of exam, counseling, chart review, and critical decision making was performed.   Future Appointments  Date Time Provider Department Center  08/29/2020 10:00 AM Judd Gaudier, NP [facility] None   ----------------------------------------------------------------------------------------------------------------------  HPI 64 y.o. female  presents for 3 month follow up on cholesterol, T2 diabetes with CKD, obesity and vitamin D deficiency.   She reported  increased stress related to her daughter going through a divorce and moving in, CELEXA dose 20 mg and doing well.   Recently was put on doxycycline for UTI sx by Dr. Len[facility] F[facility] (where she works), culture was negative but sx resolved, no recurrence.   BMI is Body mass index is 31.11 kg/m., she has been working on diet and exercise.  Admits was on vacation and fell off the wagon, was up to 182 lb but down 5 lb since then  She is walking 20-30 min 3-5 days a week, trying to increase, also going to the pool nearly daily   She also plans to improve her diet, has been doing a lot of salads and chicken Trying to reduce carbs, less potatoes, rice, doing riced cauliflower enjoying this  Thinking about noom as husband is doing well with this  She drinks 1 coffee in the AM, reports 3 cans of diet soda daily, 2-3 glasses of water  Wt Readings from Last 3 Encounters:  04/11/20 177 lb (80.3 kg)  03/26/20 174 lb (78.9 kg)  11/30/19 173 lb 3.2 oz (78.6 kg)   Today their BP is BP: 106/66 Declines ACEi/ARB for now, low BPs at home.   She does workout. She denies chest pain, shortness of breath, dizziness.   She is on cholesterol medication (rosuvastatin 40 mg daily, but admits has been off for 3 weeks forgetting to refill) and denies myalgias. Her cholesterol is at goal. The cholesterol last visit was:   Lab Results  Component Value Date   CHOL 122 11/30/2019   HDL 38 (L) 11/30/2019   LDLCALC 58 11/30/2019   TRIG 190 (H) 11/30/2019   CHOLHDL 3.2 11/30/2019    She has not been working on  diet and exercise for T2 diabetes ([name] 25 mg, trulicity 3 mg weekly, glipizide 10 mg AM, 5 mg PM, hx of metformin intolerance), was following with Dr. Elvera Lennox as well, last seen Oct 2020, patient pref not to go back), and denies hypoglycemia , increased appetite, nausea, paresthesia of the feet, polydipsia, polyuria, visual disturbances and vomiting.  She has been checking AM sugars regularly, reports fasting ranging  130-165, improved from previous  Does not check postprandial.  Last A1C in the office was:  Lab Results  Component Value Date   HGBA1C 8.5 (A) 11/16/2019   She has CKD I/II associated with T2DM monitored at this office; pt declined ACEi/ARB:   Lab Results  Component Value Date   GFRNONAA 90 11/30/2019   Patient is newly back on Vitamin D supplement, taking 4000 but admits inconsistently, hasn't been taking in recent weeks, plans to restart with liquid formulation, was very low at the last check:    Lab Results  Component Value Date   VD25OH 12 (L) 08/15/2019       Current Medications:  Current Outpatient Medications on File Prior to Visit  Medication Sig   aspirin EC 81 MG tablet Take 81 mg by mouth daily.   citalopram (CELEXA) 40 MG tablet TAKE 1 TABLET EACH DAY.   Dulaglutide ([name]) 3 MG/0.5ML SOPN Inject 3 mg into the skin once a week.   empagliflozin ([name]) 25 MG TABS tablet Take 25 mg by mouth daily.   glipiZIDE (GLUCOTROL) 5 MG tablet Take 10 mg before breakfast and 5 mg before midnight   glucose blood (ONETOUCH VERIO) test strip Use 2x a day   meclizine (ANTIVERT) 25 MG tablet 1/2-1 pill up to 3 times daily for motion sickness/dizziness (Patient taking differently: as needed. 1/2-1 pill up to 3 times daily for motion sickness/dizziness)   montelukast (SINGULAIR) 10 MG tablet TAKE ONE TABLET AT BEDTIME.   OneTouch Delica Lancets 33G MISC Check 2x a day   OVER THE COUNTER MEDICATION Takes an OTC Allertec   Cholecalciferol (VITAMIN D3) 2000 units TABS Take 4,000 Units by mouth. (Patient not taking: Reported on 04/11/2020)   rosuvastatin (CRESTOR) 40 MG tablet Take 1 tablet Daily for Cholesterol (Patient not taking: Reported on 04/11/2020)   terconazole (TERAZOL 7) 0.4 % vaginal cream Place 1 applicator vaginally at bedtime. (Patient not taking: Reported on 04/11/2020)   No current facility-administered medications on file prior to visit.     Allergies:   Allergies  Allergen Reactions   Levaquin [Levofloxacin] Hives and Other (See Comments)    Tingling sensation to mouth, near LOC, blurred vision,     Iodine     Unknown reaction    Levofloxacin In D5w Hives    Hives   Metformin And Related Diarrhea   Shellfish Allergy     Unknown reaction    Vitamin D Analogs Diarrhea    High dose Vitamin D caused diarrhea   Amoxicillin Rash   Eggs Or Egg-Derived Products     Low, by skin test only, local itching when injected    Kombiglyze [Saxagliptin-Metformin Er] Other (See Comments)    Bloating   Latex Itching and Rash   Penicillins Rash     Medical History:  Past Medical History:  Diagnosis Date   Anxiety    Diabetes mellitus without complication (HCC)    Hx of colonic polyps    Fhx Colon cancer   Hyperlipidemia    Hypocholesteremia    Tobacco abuse    Family  history- Reviewed and unchanged Social history- Reviewed and unchanged   Review of Systems:  Review of Systems  Constitutional: Negative for malaise/fatigue and weight loss.  HENT: Negative for hearing loss, sore throat and tinnitus.   Eyes: Negative for blurred vision and double vision.  Respiratory: Negative for cough, sputum production, shortness of breath and wheezing.   Cardiovascular: Negative for chest pain, palpitations, orthopnea, claudication and leg swelling.  Gastrointestinal: Negative for abdominal pain, blood in stool, constipation, diarrhea, heartburn, melena, nausea and vomiting.  Genitourinary: Negative.   Musculoskeletal: Negative for joint pain, myalgias and neck pain.  Skin: Negative for rash.  Neurological: Negative for dizziness, tingling, sensory change, focal weakness, weakness and headaches.  Endo/Heme/Allergies: Negative for polydipsia.  Psychiatric/Behavioral: Negative for depression and substance abuse. The patient is not nervous/anxious and does not have insomnia.   All other systems reviewed and are negative.   Physical  Exam: BP 106/66    Pulse 92    Temp (!) 96.1 F (35.6 C)    Wt 177 lb (80.3 kg)    SpO2 96%    BMI 31.11 kg/m  Wt Readings from Last 3 Encounters:  04/11/20 177 lb (80.3 kg)  03/26/20 174 lb (78.9 kg)  11/30/19 173 lb 3.2 oz (78.6 kg)   General Appearance: Well nourished, in no apparent distress. Eyes: PERRLA, EOMs, conjunctiva no swelling or erythema Sinuses: No Frontal/maxillary tenderness ENT/Mouth: Ext aud canals clear, TMs without erythema, bulging. No erythema, swelling, or exudate on post pharynx.  Tonsils not swollen or erythematous. Hearing normal.  Neck: Supple, thyroid normal.  Respiratory: Respiratory effort normal, BS equal bilaterally without rales, rhonchi, wheezing or stridor.  Cardio: RRR with no MRGs. Brisk peripheral pulses without edema.  Abdomen: Soft, + BS.  Non tender, no guarding, rebound, hernias, masses. Lymphatics: Non tender without lymphadenopathy.  Musculoskeletal: Full active ROM intact, upper extremity strength somewhat limited by pain with internal rotation of shoulder, with triceps flexion and with internal shoulder rotation, reported palpable lump is not present though with tenderness over lateral triceps. Normal gait.  Skin: Warm, dry without rashes, lesions, ecchymosis.  Neuro: Cranial nerves intact. No cerebellar symptoms. Sensation intact Psych: Awake and oriented X 3, normal affect, Insight and Judgment appropriate.    Dan Maker, NP 4:21 PM [facility]

## 2020-04-11 ENCOUNTER — Ambulatory Visit: Payer: 59 | Admitting: Adult Health

## 2020-04-11 ENCOUNTER — Encounter: Payer: Self-pay | Admitting: Adult Health

## 2020-04-11 ENCOUNTER — Other Ambulatory Visit: Payer: Self-pay

## 2020-04-11 VITALS — BP 106/66 | HR 92 | Temp 96.1°F | Wt 177.0 lb

## 2020-04-11 DIAGNOSIS — E119 Type 2 diabetes mellitus without complications: Secondary | ICD-10-CM

## 2020-04-11 DIAGNOSIS — E559 Vitamin D deficiency, unspecified: Secondary | ICD-10-CM

## 2020-04-11 DIAGNOSIS — E785 Hyperlipidemia, unspecified: Secondary | ICD-10-CM

## 2020-04-11 DIAGNOSIS — N182 Chronic kidney disease, stage 2 (mild): Secondary | ICD-10-CM

## 2020-04-11 DIAGNOSIS — F411 Generalized anxiety disorder: Secondary | ICD-10-CM

## 2020-04-11 DIAGNOSIS — E1169 Type 2 diabetes mellitus with other specified complication: Secondary | ICD-10-CM | POA: Diagnosis not present

## 2020-04-11 DIAGNOSIS — E1122 Type 2 diabetes mellitus with diabetic chronic kidney disease: Secondary | ICD-10-CM | POA: Diagnosis not present

## 2020-04-11 DIAGNOSIS — Z79899 Other long term (current) drug therapy: Secondary | ICD-10-CM

## 2020-04-11 DIAGNOSIS — E669 Obesity, unspecified: Secondary | ICD-10-CM

## 2020-04-11 MED ORDER — ROSUVASTATIN CALCIUM 40 MG PO TABS: ORAL_TABLET | ORAL | 3 refills | Status: DC

## 2020-04-11 MED ORDER — MECLIZINE HCL 25 MG PO TABS: ORAL_TABLET | ORAL | 0 refills | Status: DC

## 2020-04-11 NOTE — Patient Instructions (Signed)
Goals    . Exercise 150 min/wk Moderate Activity    . Fasting Blood Glucose <150    . HEMOGLOBIN A1C < 7.0    . LDL CALC < 70    . Weight (lb) < 160 lb (72.6 kg)      Try to cut down to avoid daily diet soda, replace with water  Take glipizide within 15-20 min of eating, or with food is fine    High-Fiber Diet Fiber, also called dietary fiber, is a type of carbohydrate that is found in fruits, vegetables, whole grains, and beans. A high-fiber diet can have many health benefits. Your health care provider may recommend a high-fiber diet to help:  Prevent constipation. Fiber can make your bowel movements more regular.  Lower your cholesterol.  Relieve the following conditions: ? Swelling of veins in the anus (hemorrhoids). ? Swelling and irritation (inflammation) of specific areas of the digestive tract (uncomplicated diverticulosis). ? A problem of the large intestine (colon) that sometimes causes pain and diarrhea (irritable bowel syndrome, IBS).  Prevent overeating as part of a weight-loss plan.  Prevent heart disease, type 2 diabetes, and certain cancers. What is my plan? The recommended daily fiber intake in grams (g) includes:  38 g for men age 12 or younger.  30 g for men over age 68.  25 g for women age 32 or younger.  21 g for women over age 22. You can get the recommended daily intake of dietary fiber by:  Eating a variety of fruits, vegetables, grains, and beans.  Taking a fiber supplement, if it is not possible to get enough fiber through your diet. What do I need to know about a high-fiber diet?  It is better to get fiber through food sources rather than from fiber supplements. There is not a lot of research about how effective supplements are.  Always check the fiber content on the nutrition facts label of any prepackaged food. Look for foods that contain 5 g of fiber or more per serving.  Talk with a diet and nutrition specialist (dietitian) if you have  questions about specific foods that are recommended or not recommended for your medical condition, especially if those foods are not listed below.  Gradually increase how much fiber you consume. If you increase your intake of dietary fiber too quickly, you may have bloating, cramping, or gas.  Drink plenty of water. Water helps you to digest fiber. What are tips for following this plan?  Eat a wide variety of high-fiber foods.  Make sure that half of the grains that you eat each day are whole grains.  Eat breads and cereals that are made with whole-grain flour instead of refined flour or white flour.  Eat brown rice, bulgur wheat, or millet instead of white rice.  Start the day with a breakfast that is high in fiber, such as a cereal that contains 5 g of fiber or more per serving.  Use beans in place of meat in soups, salads, and pasta dishes.  Eat high-fiber snacks, such as berries, raw vegetables, nuts, and popcorn.  Choose whole fruits and vegetables instead of processed forms like juice or sauce. What foods can I eat?  Fruits Berries. Pears. Apples. Oranges. Avocado. Prunes and raisins. Dried figs. Vegetables Sweet potatoes. Spinach. Kale. Artichokes. Cabbage. Broccoli. Cauliflower. Green peas. Carrots. Squash. Grains Whole-grain breads. Multigrain cereal. Oats and oatmeal. Brown rice. Barley. Bulgur wheat. Millet. Quinoa. Bran muffins. Popcorn. Rye wafer crackers. Meats and other proteins  [place], kidney, and pinto beans. Soybeans. Split peas. Lentils. Nuts and seeds. Dairy Fiber-fortified yogurt. Beverages Fiber-fortified soy milk. Fiber-fortified orange juice. Other foods Fiber bars. The items listed above may not be a complete list of recommended foods and beverages. Contact a dietitian for more options. What foods are not recommended? Fruits Fruit juice. Cooked, strained fruit. Vegetables Fried potatoes. Canned vegetables. Well-cooked vegetables. Grains White bread.  Pasta made with refined flour. White rice. Meats and other proteins Fatty cuts of meat. Fried chicken or fried fish. Dairy Milk. Yogurt. Cream cheese. Sour cream. Fats and oils Butters. Beverages Soft drinks. Other foods Cakes and pastries. The items listed above may not be a complete list of foods and beverages to avoid. Contact a dietitian for more information. Summary  Fiber is a type of carbohydrate. It is found in fruits, vegetables, whole grains, and beans.  There are many health benefits of eating a high-fiber diet, such as preventing constipation, lowering blood cholesterol, helping with weight loss, and reducing your risk of heart disease, diabetes, and certain cancers.  Gradually increase your intake of fiber. Increasing too fast can result in cramping, bloating, and gas. Drink plenty of water while you increase your fiber.  The best sources of fiber include whole fruits and vegetables, whole grains, nuts, seeds, and beans. This information is not intended to replace advice given to you by your health care provider. Make sure you discuss any questions you have with your health care provider. Document Revised: 08/03/2017 Document Reviewed: 08/03/2017 Elsevier Patient Education  2020 [employer].

## 2020-04-12 LAB — CBC WITH DIFFERENTIAL/PLATELET
Absolute Monocytes: 602 cells/uL (ref 200–950)
Basophils Absolute: 49 cells/uL (ref 0–200)
Basophils Relative: 0.7 %
Eosinophils Absolute: 140 cells/uL (ref 15–500)
Eosinophils Relative: 2 %
HCT: 47.5 % — ABNORMAL HIGH (ref 35.0–45.0)
Hemoglobin: 16.1 g/dL — ABNORMAL HIGH (ref 11.7–15.5)
Lymphs Abs: 2100 cells/uL (ref 850–3900)
MCH: 31.7 pg (ref 27.0–33.0)
MCHC: 33.9 g/dL (ref 32.0–36.0)
MCV: 93.5 fL (ref 80.0–100.0)
MPV: 11 fL (ref 7.5–12.5)
Monocytes Relative: 8.6 %
Neutro Abs: 4109 cells/uL (ref 1500–7800)
Neutrophils Relative %: 58.7 %
Platelets: 223 10*3/uL (ref 140–400)
RBC: 5.08 10*6/uL (ref 3.80–5.10)
RDW: 12.2 % (ref 11.0–15.0)
Total Lymphocyte: 30 %
WBC: 7 10*3/uL (ref 3.8–10.8)

## 2020-04-12 LAB — COMPLETE METABOLIC PANEL WITH GFR
AG Ratio: 1.8 (calc) (ref 1.0–2.5)
ALT: 19 U/L (ref 6–29)
AST: 15 U/L (ref 10–35)
Albumin: 4.8 g/dL (ref 3.6–5.1)
Alkaline phosphatase (APISO): 108 U/L (ref 37–153)
BUN: 16 mg/dL (ref 7–25)
CO2: 25 mmol/L (ref 20–32)
Calcium: 10.3 mg/dL (ref 8.6–10.4)
Chloride: 103 mmol/L (ref 98–110)
Creat: 0.84 mg/dL (ref 0.50–0.99)
GFR, Est African American: 87 mL/min/{1.73_m2} (ref >=60)
GFR, Est Non African American: 75 mL/min/{1.73_m2} (ref >=60)
Globulin: 2.6 g/dL (calc) (ref 1.9–3.7)
Glucose, Bld: 191 mg/dL — ABNORMAL HIGH (ref 65–99)
Potassium: 4.6 mmol/L (ref 3.5–5.3)
Sodium: 139 mmol/L (ref 135–146)
Total Bilirubin: 0.5 mg/dL (ref 0.2–1.2)
Total Protein: 7.4 g/dL (ref 6.1–8.1)

## 2020-04-12 LAB — HEMOGLOBIN A1C
Hgb A1c MFr Bld: 7.4 % of total Hgb — ABNORMAL HIGH (ref <5.7)
Mean Plasma Glucose: 166 (calc)
eAG (mmol/L): 9.2 (calc)

## 2020-06-27 ENCOUNTER — Other Ambulatory Visit: Payer: Self-pay

## 2020-06-27 ENCOUNTER — Ambulatory Visit: Payer: 59 | Admitting: Adult Health

## 2020-06-27 ENCOUNTER — Encounter: Payer: Self-pay | Admitting: Adult Health

## 2020-06-27 VITALS — BP 116/74 | HR 62 | Temp 97.3°F | Wt 176.0 lb

## 2020-06-27 DIAGNOSIS — D751 Secondary polycythemia: Secondary | ICD-10-CM

## 2020-06-27 DIAGNOSIS — R42 Dizziness and giddiness: Secondary | ICD-10-CM

## 2020-06-27 DIAGNOSIS — R0989 Other specified symptoms and signs involving the circulatory and respiratory systems: Secondary | ICD-10-CM | POA: Diagnosis not present

## 2020-06-27 MED ORDER — PREDNISONE 20 MG PO TABS
ORAL_TABLET | ORAL | 0 refills | Status: DC
Start: 2020-06-27 — End: 2020-07-05

## 2020-06-27 NOTE — Patient Instructions (Addendum)
Please get covid 19 test this afternoon  Take meclizine as needed  Call back if any changes in symptoms or not getting any better     Dizziness Dizziness is a common problem. It is a feeling of unsteadiness or light-headedness. You may feel like you are about to faint. Dizziness can lead to injury if you stumble or fall. Anyone can become dizzy, but dizziness is more common in older adults. This condition can be caused by a number of things, including medicines, dehydration, or illness. Follow these instructions at home: Eating and drinking  Drink enough fluid to keep your urine clear or pale yellow. This helps to keep you from becoming dehydrated. Try to drink more clear fluids, such as water.  Do not drink alcohol.  Limit your caffeine intake if told to do so by your health care provider. Check ingredients and nutrition facts to see if a food or beverage contains caffeine.  Limit your salt (sodium) intake if told to do so by your health care provider. Check ingredients and nutrition facts to see if a food or beverage contains sodium. Activity  Avoid making quick movements. ? Rise slowly from chairs and steady yourself until you feel okay. ? In the morning, first sit up on the side of the bed. When you feel okay, stand slowly while you hold onto something until you know that your balance is fine.  If you need to stand in one place for a long time, move your legs often. Tighten and relax the muscles in your legs while you are standing.  Do not drive or use heavy machinery if you feel dizzy.  Avoid bending down if you feel dizzy. Place items in your home so that they are easy for you to reach without leaning over. Lifestyle  Do not use any products that contain nicotine or tobacco, such as cigarettes and e-cigarettes. If you need help quitting, ask your health care provider.  Try to reduce your stress level by using methods such as yoga or meditation. Talk with your health care  provider if you need help to manage your stress. General instructions  Watch your dizziness for any changes.  Take over-the-counter and prescription medicines only as told by your health care provider. Talk with your health care provider if you think that your dizziness is caused by a medicine that you are taking.  Tell a friend or a family member that you are feeling dizzy. If he or she notices any changes in your behavior, have this person call your health care provider.  Keep all follow-up visits as told by your health care provider. This is important. Contact a health care provider if:  Your dizziness does not go away.  Your dizziness or light-headedness gets worse.  You feel nauseous.  You have reduced hearing.  You have new symptoms.  You are unsteady on your feet or you feel like the room is spinning. Get help right away if:  You vomit or have diarrhea and are unable to eat or drink anything.  You have problems talking, walking, swallowing, or using your arms, hands, or legs.  You feel generally weak.  You are not thinking clearly or you have trouble forming sentences. It may take a friend or family member to notice this.  You have chest pain, abdominal pain, shortness of breath, or sweating.  Your vision changes.  You have any bleeding.  You have a severe headache.  You have neck pain or a stiff neck.  You have a fever. These symptoms may represent a serious problem that is an emergency. Do not wait to see if the symptoms will go away. Get medical help right away. Call your local emergency services (911 in the [place] Do not drive yourself to the hospital. Summary  Dizziness is a feeling of unsteadiness or light-headedness. This condition can be caused by a number of things, including medicines, dehydration, or illness.  Anyone can become dizzy, but dizziness is more common in older adults.  Drink enough fluid to keep your urine clear or pale yellow. Do not  drink alcohol.  Avoid making quick movements if you feel dizzy. Monitor your dizziness for any changes. This information is not intended to replace advice given to you by your health care provider. Make sure you discuss any questions you have with your health care provider. Document Revised: 10/02/2017 Document Reviewed: 11/01/2016 Elsevier Patient Education  2020 [employer].

## 2020-06-27 NOTE — Progress Notes (Signed)
Assessment and Plan:  Candice [name] was seen today for acute visit and dizziness.  Diagnoses and all orders for this visit:  Episode of dizziness Episodic dizziness, no cardiac accompaniments, not elicited in office today Also with allergy vs mild URI sx; not vaccinated; recommended rapid covid 19 testing today - [facility] as unable to offer this today at our office Also with chills, low grade temp at home Sudafed helped sx;  Will check basic labs, but sent in steroid taper to start Suggested symptomatic OTC remedies. Notify office if covid positive; push fluids Continue baby aspirin If persistent/not improving consider holter, consider carotid US/CT due to smoking hx Go to the ER if any chest pain, shortness of breath, nausea, worsening dizziness, severe HA, changes vision/speech -     CBC with Differential/Platelet -     COMPLETE METABOLIC PANEL WITH GFR -     Urinalysis, Routine w reflex microscopic  Upper respiratory symptom Suggested symptomatic OTC remedies. Nasal saline spray for congestion. Nasal steroids, allergy pill, oral steroids offered Follow up as needed for persistent or worsening sx  Other orders -     predniSONE (DELTASONE) 20 MG tablet; 2 tablets daily for 3 days, 1 tablet daily for 4 days.  Further disposition pending results of labs. Discussed med's effects and SE's.   Over 20 minutes of exam, counseling, chart review, and critical decision making was performed.   Future Appointments  Date Time Provider Department Center  08/29/2020 10:00 AM Judd Gaudier, NP [facility] None    ------------------------------------------------------------------------------------------------------------------   HPI BP 116/74   Pulse 62   Temp (!) 97.3 F (36.3 C)   Wt 176 lb (79.8 kg)   SpO2 96%   BMI 30.93 kg/m   64 y.o.female with T2DM, former smoker presents for evaluation of  weeks of dizzy episodes with URi sx and low grade fever at home.   She reports this  began last week, had sudden onset of dizziness while she was sitting, feels like she is spinning, will be hot/clammy/sweaty, will have sense of total body tingling, sx will last 30-60 min before resolving gradually, will feel somewhat wiped out after. Also has noted "allergy" like sx; nasal congestion, pressure in left ear, ringing (non pulsatile) in that ear.   For dizzy episodes - Denies consistent trigger, has had with standing/sitting, Denies exertion as trigger, more often with sitting, not associated with eating or other particular activity. Denies with position change, neck extension. Denies HA, vision changes, weakness.  Denies palpitations, dyspnea, cough, chest pressure, edema.  Denies changes in taste or smell.   Tried 1/2 tab meclizine which did seem to help yesterday,  Started taking allergy med, took sudafed yesterday which did seem to help significantly  Has had low grade temp that started yesterday, ~99.8, did have chills all weekend, normal temp at this time.   Has checked sugars (135-170), hasn't checked BPs, not on Bp med. Orthostatics are normal today.   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 Anxiety   . Diabetes mellitus without complication (HCC)   . Hx of colonic polyps    Fhx Colon cancer  . Hyperlipidemia   . Hypocholesteremia   . Tobacco abuse      Allergies  Allergen Reactions  . Levaquin [Levofloxacin] Hives and Other (  See Comments)    Tingling sensation to mouth, near LOC, blurred vision,    . Iodine     Unknown reaction   . Levofloxacin In D5w Hives    Hives  . Metformin And Related Diarrhea  . Shellfish Allergy     Unknown reaction   . Vitamin D Analogs Diarrhea    High dose Vitamin D caused diarrhea  . Amoxicillin Rash  . Eggs Or Egg-Derived  Products     Low, by skin test only, local itching when injected   . Kombiglyze [Saxagliptin-Metformin Er] Other (See Comments)    Bloating  . Latex Itching and Rash  . Penicillins Rash    Current Outpatient Medications on File Prior to Visit  Medication Sig  . aspirin EC 81 MG tablet Take 81 mg by mouth daily.  . Cholecalciferol (VITAMIN D3) 2000 units TABS Take 4,000 Units by mouth.   . citalopram (CELEXA) 40 MG tablet TAKE 1 TABLET EACH DAY.  . Dulaglutide ([name]) 3 MG/0.5ML SOPN Inject 3 mg into the skin once a week.  . empagliflozin ([name]) 25 MG TABS tablet Take 25 mg by mouth daily.  Marland Kitchen glipiZIDE (GLUCOTROL) 5 MG tablet Take 10 mg before breakfast and 5 mg before midnight  . glucose blood (ONETOUCH VERIO) test strip Use 2x a day  . meclizine (ANTIVERT) 25 MG tablet 1/2-1 pill up to 3 times daily for motion sickness/dizziness  . montelukast (SINGULAIR) 10 MG tablet TAKE ONE TABLET AT BEDTIME.  Letta Pate Delica Lancets 33G MISC Check 2x a day  . OVER THE COUNTER MEDICATION Takes an OTC Allertec  . rosuvastatin (CRESTOR) 40 MG tablet Take 1 tablet Daily for Cholesterol  . terconazole (TERAZOL 7) 0.4 % vaginal cream Place 1 applicator vaginally at bedtime.   No current facility-administered medications on file prior to visit.    ROS: all negative except above.   Physical Exam:  BP 116/74   Pulse 62   Temp (!) 97.3 F (36.3 C)   Wt 176 lb (79.8 kg)   SpO2 96%   BMI 30.93 kg/m   General Appearance: Well nourished, well dressed, in no apparent distress. Eyes: PERRLA, EOMs, conjunctiva no swelling or erythema Sinuses: No Frontal/maxillary tenderness ENT/Mouth: Ext aud canals clear, TMs without erythema, bulging. Pharynx mildly erythematous, without swelling, or exudate on post pharynx.  Tonsils not swollen or erythematous. Hearing normal.  Neck: Supple, thyroid normal. Dizziness not elicited by neck extension or full ROM.  Respiratory: Respiratory effort normal, BS  equal bilaterally without rales, rhonchi, wheezing or stridor.  Cardio: RRR with no MRGs. Brisk peripheral pulses without edema.  Abdomen: Soft, + BS.  Non tender, no guarding, rebound, hernias, masses. Lymphatics: Non tender without lymphadenopathy.  Musculoskeletal: Full ROM, 5/5 strength, normal gait.  Skin: Feels Warm/moist though not diaphoretic, without rashes, lesions, ecchymosis.  Neuro: Cranial nerves intact. Normal muscle tone, no cerebellar symptoms. Sensation intact. Psych: Awake and oriented X 3, normal affect, Insight and Judgment appropriate.     Dan Maker, NP 1:22 PM [facility]

## 2020-06-28 ENCOUNTER — Other Ambulatory Visit: Payer: Self-pay

## 2020-06-28 ENCOUNTER — Ambulatory Visit (INDEPENDENT_AMBULATORY_CARE_PROVIDER_SITE_OTHER): Payer: 59

## 2020-06-28 VITALS — HR 86 | Temp 97.7°F

## 2020-06-28 DIAGNOSIS — Z1152 Encounter for screening for COVID-19: Secondary | ICD-10-CM | POA: Diagnosis not present

## 2020-06-28 DIAGNOSIS — D751 Secondary polycythemia: Secondary | ICD-10-CM | POA: Insufficient documentation

## 2020-06-28 LAB — POC COVID19 BINAXNOW: SARS Coronavirus 2 Ag: NEGATIVE

## 2020-06-28 NOTE — Addendum Note (Signed)
Addended by: Dan Maker on: 06/28/2020 08:11 AM   Modules accepted: Orders

## 2020-06-28 NOTE — Progress Notes (Signed)
PATIENT reports that when she woke up this morning dizzy as well as all the other sxs(see visit info). Patient states that she has MyChart so you can send any information directly to her. Patient would like to know what is the next step?

## 2020-06-29 LAB — COMPLETE METABOLIC PANEL WITH GFR
AG Ratio: 2.1 (calc) (ref 1.0–2.5)
ALT: 24 U/L (ref 6–29)
AST: 18 U/L (ref 10–35)
Albumin: 5 g/dL (ref 3.6–5.1)
Alkaline phosphatase (APISO): 95 U/L (ref 37–153)
BUN: 14 mg/dL (ref 7–25)
CO2: 27 mmol/L (ref 20–32)
Calcium: 10 mg/dL (ref 8.6–10.4)
Chloride: 104 mmol/L (ref 98–110)
Creat: 0.72 mg/dL (ref 0.50–0.99)
GFR, Est African American: 105 mL/min/{1.73_m2} (ref >=60)
GFR, Est Non African American: 90 mL/min/{1.73_m2} (ref >=60)
Globulin: 2.4 g/dL (calc) (ref 1.9–3.7)
Glucose, Bld: 204 mg/dL — ABNORMAL HIGH (ref 65–99)
Potassium: 4.9 mmol/L (ref 3.5–5.3)
Sodium: 140 mmol/L (ref 135–146)
Total Bilirubin: 0.8 mg/dL (ref 0.2–1.2)
Total Protein: 7.4 g/dL (ref 6.1–8.1)

## 2020-06-29 LAB — CBC WITH DIFFERENTIAL/PLATELET
Absolute Monocytes: 518 cells/uL (ref 200–950)
Basophils Absolute: 50 cells/uL (ref 0–200)
Basophils Relative: 0.7 %
Eosinophils Absolute: 170 cells/uL (ref 15–500)
Eosinophils Relative: 2.4 %
HCT: 49.4 % — ABNORMAL HIGH (ref 35.0–45.0)
Hemoglobin: 16.7 g/dL — ABNORMAL HIGH (ref 11.7–15.5)
Lymphs Abs: 1981 cells/uL (ref 850–3900)
MCH: 31.5 pg (ref 27.0–33.0)
MCHC: 33.8 g/dL (ref 32.0–36.0)
MCV: 93 fL (ref 80.0–100.0)
MPV: 10.7 fL (ref 7.5–12.5)
Monocytes Relative: 7.3 %
Neutro Abs: 4381 cells/uL (ref 1500–7800)
Neutrophils Relative %: 61.7 %
Platelets: 229 10*3/uL (ref 140–400)
RBC: 5.31 10*6/uL — ABNORMAL HIGH (ref 3.80–5.10)
RDW: 12.2 % (ref 11.0–15.0)
Total Lymphocyte: 27.9 %
WBC: 7.1 10*3/uL (ref 3.8–10.8)

## 2020-06-29 LAB — URINALYSIS, ROUTINE W REFLEX MICROSCOPIC
Bilirubin Urine: NEGATIVE
Hgb urine dipstick: NEGATIVE
Ketones, ur: NEGATIVE
Leukocytes,Ua: NEGATIVE
Nitrite: NEGATIVE
Protein, ur: NEGATIVE
Specific Gravity, Urine: 1.04 — ABNORMAL HIGH (ref 1.001–1.03)
pH: <=5 (ref 5.0–8.0)

## 2020-06-29 LAB — IRON: Iron: 126 ug/dL (ref 45–160)

## 2020-06-29 LAB — FERRITIN: Ferritin: 179 ng/mL (ref 16–288)

## 2020-06-29 LAB — PATHOLOGIST SMEAR REVIEW

## 2020-07-04 ENCOUNTER — Other Ambulatory Visit: Payer: Self-pay

## 2020-07-04 ENCOUNTER — Encounter: Payer: Self-pay | Admitting: Adult Health

## 2020-07-04 ENCOUNTER — Ambulatory Visit (INDEPENDENT_AMBULATORY_CARE_PROVIDER_SITE_OTHER): Payer: 59 | Admitting: Adult Health

## 2020-07-04 VITALS — BP 122/72 | HR 91 | Temp 97.9°F | Wt 178.0 lb

## 2020-07-04 DIAGNOSIS — Z20822 Contact with and (suspected) exposure to covid-19: Secondary | ICD-10-CM

## 2020-07-04 DIAGNOSIS — R42 Dizziness and giddiness: Secondary | ICD-10-CM

## 2020-07-04 DIAGNOSIS — R5383 Other fatigue: Secondary | ICD-10-CM

## 2020-07-04 NOTE — Progress Notes (Signed)
Assessment and Plan:  Candice [name] was seen today for acute visit and dizziness.  Diagnoses and all orders for this visit:  Episodic of dizziness/ new fatigue Episodic dizziness, suspect vertigo, no cardiac accompaniments, normal neuro exam, not elicited in office Labs on 9/15 after onset were at baseline, - has had mild allergy vs URI sx - rapid covid 19 was negative Steroid taper helped significantly, only 1 episode since starting Also with allergy vs mild URI sx; it remains a possibility that she has covid 19, discussed possible false negative tests, discussed and will pursue PCR test today Encouraged continue antihistamine, sudafed PRN, add nasal sprays for congestion Continue pushing fluids, continue baby aspirin She is concerned about cardiac etiology; she is diabetic, former smoker, female; may have atypical presentation for MI - EKG today is consistent with baseline excepting Q wave and T wave inversion limited to lead III without reciprocal changes in leads II and aVF, low suspicion, reviewed with Dr. Oneta Rack and in agreement Continue baby aspirin However if persistent/not improving consider cardiology referral, may benefit from holter, ECHO Go to the ER if any chest pain, shortness of breath, nausea, worsening dizziness, severe HA, changes vision/speech -     CBC with Differential/Platelet -     CRP -     EKG -     Troponin I  Further disposition pending results of labs. Discussed med's effects and SE's.   Over 20 minutes of exam, counseling, chart review, and critical decision making was performed.   Future Appointments  Date Time Provider Department Center  08/29/2020 10:00 AM Judd Gaudier, NP [facility] None    ------------------------------------------------------------------------------------------------------------------   HPI BP 122/72   Pulse 91   Temp 97.9 F (36.6 C)   Wt 178 lb (80.7 kg)   SpO2 97%   BMI 31.28 kg/m   63 y.o.female with T2DM, former smoker  presents to follow up on episodic dizziness and new fatigue.  She was evaluated for this on 9/15. She had mild URI sx, new L ear pressure/ringing, nasal congestion, low grade fever. She denied cardiac accompaniments and noted episodes were non-exertional and improved with sudafed. CBC, CMP/GFR, UA were normal/consistent with baseine that day, normal orthostatics, rapid covid 19 test on 06/28/2020 was negative. She does work as Water quality scientist and unvaccinated. She was prescribed prednisone for possible URI/allergy related or labyrinthitis. Reports first 3 days on prednisone immediately was feeling much better, then Sunday dose decreased per taper, she decided to go get nails done as she was feeling and while there had another episode of abrupt sense of spinning, broke out in sweat, tingling all over her head, resolved slowly over several minutes. She denies CP, dyspnea, palpitations, neck/extremity pain, HA, blurry vision, extremity weakness, syncope.   She reports in the last 2 days has felt very fatigued, gets up thinking she feels well until she tries to do things, will feel weak and wiped out. She continues to endorse mild frontal sinus pressure, has had very mild non-productive cough, She denies fever/chills. She reports has had intermittent pressure/ringing in her L ear.   This is typical allergy season for her, is taking loratadine, has taken sudafed, not doing nasal sprays.   For dizzy episodes - Denies consistent trigger, has had with standing/sitting, Denies exertion as trigger, more often with sitting, not associated with eating or other particular activity. Denies with position change, neck extension. Denies HA, vision changes, weakness.  Denies palpitations, dyspnea, chest pressure, edema.  Denies changes in taste or smell.  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 Anxiety   . Diabetes mellitus  without complication (HCC)   . Hx of colonic polyps    Fhx Colon cancer  . Hyperlipidemia   . Hypocholesteremia   . Tobacco abuse      Allergies  Allergen Reactions  . Levaquin [Levofloxacin] Hives and Other (See Comments)    Tingling sensation to mouth, near LOC, blurred vision,    . Iodine     Unknown reaction   . Levofloxacin In D5w Hives    Hives  . Metformin And Related Diarrhea  . Shellfish Allergy     Unknown reaction   . Vitamin D Analogs Diarrhea    High dose Vitamin D caused diarrhea  . Amoxicillin Rash  . Eggs Or Egg-Derived Products     Low, by skin test only, local itching when injected   . Kombiglyze [Saxagliptin-Metformin Er] Other (See Comments)    Bloating  . Latex Itching and Rash  . Penicillins Rash    Current Outpatient Medications on File Prior to Visit  Medication Sig  . aspirin EC 81 MG tablet Take 81 mg by mouth daily.  . Cholecalciferol (VITAMIN D3) 2000 units TABS Take 4,000 Units by mouth.   . citalopram (CELEXA) 40 MG tablet TAKE 1 TABLET EACH DAY.  . Dulaglutide ([name]) 3 MG/0.5ML SOPN Inject 3 mg into the skin once a week.  . empagliflozin ([name]) 25 MG TABS tablet Take 25 mg by mouth daily.  Marland Kitchen glipiZIDE (GLUCOTROL) 5 MG tablet Take 10 mg before breakfast and 5 mg before midnight  . glucose blood (ONETOUCH VERIO) test strip Use 2x a day  . meclizine (ANTIVERT) 25 MG tablet 1/2-1 pill up to 3 times daily for motion sickness/dizziness  . montelukast (SINGULAIR) 10 MG tablet TAKE ONE TABLET AT BEDTIME.  Letta Pate Delica Lancets 33G MISC Check 2x a day  . OVER THE COUNTER MEDICATION Takes an OTC Allertec  . predniSONE (DELTASONE) 20 MG tablet 2 tablets daily for 3 days, 1 tablet daily for 4 days.  . rosuvastatin (CRESTOR) 40 MG tablet Take 1 tablet Daily for Cholesterol  . terconazole (TERAZOL 7) 0.4 % vaginal cream Place 1 applicator vaginally at bedtime.   No current facility-administered medications on file prior to visit.     ROS: all negative except above.   Physical Exam:  BP 122/72   Pulse 91   Temp 97.9 F (36.6 C)   Wt 178 lb (80.7 kg)   SpO2 97%   BMI 31.28 kg/m   General Appearance: Well nourished, well dressed, in no apparent distress. Eyes: PERRLA, EOMs, conjunctiva no swelling or erythema Sinuses: No Frontal/maxillary tenderness ENT/Mouth: Ext aud canals clear, TMs without erythema, bulging. Pharynx mildly erythematous, without swelling, or exudate on post pharynx.  Tonsils not swollen or erythematous. Hearing normal.  Neck: Supple, thyroid normal. Dizziness not elicited by neck extension or full ROM.  Respiratory: Respiratory effort normal, BS equal bilaterally without rales, rhonchi, wheezing or stridor.  Cardio: RRR with no MRGs. Brisk peripheral pulses without edema.  Abdomen: Soft, + BS.  Non tender, no guarding, rebound, hernias, masses. Lymphatics: Non tender without lymphadenopathy.  Musculoskeletal: Full ROM, 5/5 strength, normal gait.  Skin: Feels Warm/dry, not diaphoretic, without rashes, lesions, ecchymosis.  Neuro: Cranial nerves intact. Normal muscle tone, no cerebellar symptoms. Sensation intact. Psych: Awake and oriented X 3, normal affect,  Insight and Judgment appropriate.     Dan Maker, NP 4:35 PM [facility]

## 2020-07-05 ENCOUNTER — Other Ambulatory Visit: Payer: Self-pay | Admitting: Adult Health

## 2020-07-05 LAB — CBC WITH DIFFERENTIAL/PLATELET
Absolute Monocytes: 874 cells/uL (ref 200–950)
Basophils Absolute: 58 cells/uL (ref 0–200)
Basophils Relative: 0.5 %
Eosinophils Absolute: 115 cells/uL (ref 15–500)
Eosinophils Relative: 1 %
HCT: 47.4 % — ABNORMAL HIGH (ref 35.0–45.0)
Hemoglobin: 16.2 g/dL — ABNORMAL HIGH (ref 11.7–15.5)
Lymphs Abs: 3646 cells/uL (ref 850–3900)
MCH: 31.5 pg (ref 27.0–33.0)
MCHC: 34.2 g/dL (ref 32.0–36.0)
MCV: 92 fL (ref 80.0–100.0)
MPV: 10.7 fL (ref 7.5–12.5)
Monocytes Relative: 7.6 %
Neutro Abs: 6808 cells/uL (ref 1500–7800)
Neutrophils Relative %: 59.2 %
Platelets: 267 10*3/uL (ref 140–400)
RBC: 5.15 10*6/uL — ABNORMAL HIGH (ref 3.80–5.10)
RDW: 12.6 % (ref 11.0–15.0)
Total Lymphocyte: 31.7 %
WBC: 11.5 10*3/uL — ABNORMAL HIGH (ref 3.8–10.8)

## 2020-07-05 LAB — SARS-COV-2 RNA,(COVID-19) QUALITATIVE NAAT: SARS CoV2 RNA: NOT DETECTED

## 2020-07-05 LAB — TROPONIN I: Troponin I: <3 ng/L (ref <=47)

## 2020-07-05 LAB — C-REACTIVE PROTEIN: CRP: 3.7 mg/L (ref <8.0)

## 2020-07-05 MED ORDER — PREDNISONE 20 MG PO TABS: ORAL_TABLET | ORAL | 0 refills | Status: DC

## 2020-07-11 ENCOUNTER — Ambulatory Visit (INDEPENDENT_AMBULATORY_CARE_PROVIDER_SITE_OTHER): Payer: 59 | Admitting: Internal Medicine

## 2020-07-11 ENCOUNTER — Encounter: Payer: Self-pay | Admitting: Internal Medicine

## 2020-07-11 ENCOUNTER — Other Ambulatory Visit: Payer: Self-pay

## 2020-07-11 DIAGNOSIS — Z20822 Contact with and (suspected) exposure to covid-19: Secondary | ICD-10-CM

## 2020-07-11 NOTE — Patient Instructions (Signed)
Covid 19 test obtained via nasal swab

## 2020-07-11 NOTE — Progress Notes (Signed)
   Subjective:    [name] ID: Candice [name], female    DOB: 04-21-1956, 64 y.o.   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 [name] has not been vaccinated yet but plans to do so. She is currently asymptomatic. Covid 19 PCR test obtained vis nasal swab today.    Review of Systems     Objective:   Physical Exam        Assessment & Plan:

## 2020-07-12 LAB — SARS-COV-2 RNA,(COVID-19) QUALITATIVE NAAT: SARS CoV2 RNA: NOT DETECTED

## 2020-07-14 ENCOUNTER — Other Ambulatory Visit: Payer: Self-pay | Admitting: Internal Medicine

## 2020-08-27 NOTE — Progress Notes (Signed)
Complete Physical  Assessment and Plan:  Candice [name] was seen today for annual exam.  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 Elvera Lennox;  Education: Reviewed 'ABCs' of diabetes management (respective goals in parentheses):  A1C (<7), blood pressure (<130/80), and cholesterol (LDL <70) Eye Exam yearly and Dental Exam every 6 months - patient agrees to schedule Dietary recommendations Physical Activity recommendations -     HM DIABETES FOOT EXAM  Hyperlipidemia associated with type 2 diabetes mellitus (HCC) Continue medications: rosuvastatin 40 mg daily  Has been out of medication; reordered LDL goal <70 Continue low cholesterol diet and exercise.  Check lipid panel.  -     Lipid panel -     TSH  Vitamin D deficiency Try drops for supplement discussed due to historical intolerance with caps  At minimum need 30+  -     VITAMIN D 25 Hydroxy (Vit-D Deficiency, Fractures)  Medication management -     CBC with Differential/Platelet -     COMPLETE METABOLIC PANEL WITH GFR -     Magnesium -     Urinalysis, Routine w reflex microscopic  Obesity (BMI 30.0-34.9) Long discussion about weight loss, diet, and exercise Recommended diet heavy in fruits and veggies and low in animal meats, cheeses, and dairy products, appropriate calorie intake Discussed appropriate weight for height Follow up at next visit  Polycythemia Had recent normal iron/ferritin and smear review -     CBC with Differential/Platelet -      EPO  Anxiety Well controlled with current medication; she did not tolerate taper Stress management techniques discussed, increase water, good sleep hygiene discussed, increase exercise, and increase veggies.   Labile hypertension Typically well controlled; she declines medication, ACE/ARB despite discussion of benefits with T2DM and renal protection Monitor blood pressure at home; call if consistently over 130/80 Continue DASH diet.   Reminder to go to the ER if any CP, SOB, nausea, dizziness, severe HA, changes vision/speech, left arm numbness and tingling and jaw pain. -     CBC with Differential/Platelet -     COMPLETE METABOLIC PANEL WITH GFR -     Magnesium -     TSH -     Microalbumin / creatinine urine ratio -     Urinalysis, Routine w reflex microscopic -     EKG 12-Lead -     [place], RETROPERITNL ABD,  LTD  B12 def - defer check today per patient reqeust, get on supplement and recheck later date  Family history of abdominal aortic aneurysm (AAA) Monitor; control BP, routine screening, had WNL 08/2019  Need for immunization against influenza  -     FLU VACCINE MDCK QUAD W/Preservative   Discussed med's effects and SE's. Screening labs and tests as requested with regular follow-up as recommended. Over 40 minutes of exam, counseling, chart review, and complex, high level critical decision making was performed this visit.   Future Appointments  Date Time Provider Department Center  08/29/2021 10:00 AM Judd Gaudier, NP [facility]  None     HPI  64 y.o. female  presents for a complete physical and follow up for has Former smoker; Diabetes mellitus (HCC); Hyperlipidemia associated with type 2 diabetes mellitus (HCC); Generalized anxiety disorder; Vitamin D deficiency; Medication management; Obesity (BMI 30.0-34.9); CKD stage 2 due to type 2 diabetes mellitus (HCC); and Polycythemia on their problem list.   She is married, 1 daughter, 1 grandson, 34 1/2 years old. Daughter  is divorced, some custody issues and this can be stressful. They are moving to her street next month. 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   She follows with GYN, PA Josph Macho at [facility] had PAP and mammogram last month which were normal.   She is on OTC antihistamine and Singulair for seasonable allergies.   She is on celexa, takes 1/2 tab 20 mg daily recently for anxiety hasn't done well with attempts to taper further.   BMI is Body mass index is 31.46 kg/m., she has been working on diet and exercise, trying to walk, but planning to start walking more with daughter once she moves.   She feels doing fairly with diet. She is trying to improve water intake, not sure how much.  Wt Readings from Last 3 Encounters:  08/29/20 176 lb 3.2 oz (79.9 kg)  07/04/20 178 lb (80.7 kg)  06/27/20 176 lb (79.8 kg)   Her blood pressure has been controlled at home, today their BP is BP: 110/68 She does workout. She denies chest pain, shortness of breath, dizziness.   She is on cholesterol medication (rosuvastatin 40 mg daily)  and denies myalgias. Her cholesterol is not at goal. The cholesterol last visit was:   Lab Results  Component Value Date   CHOL 122 11/30/2019   HDL 38 (L) 11/30/2019   LDLCALC 58 11/30/2019   TRIG 190 (H) 11/30/2019   CHOLHDL 3.2 11/30/2019   She has been working on diet and exercise for T2DM ([name] 25 mg, trulicity 3 mg weekly, glipizide 10 mg AM, 5 mg PM, hx of metformin intolerance), was following with Dr. Elvera Lennox as well, last seen 11/2019, patient pref not to go back, she is on bASA, she is not on ACE/ARB (low BPs, patient adamantly declines) and denies hypoglycemia , increased appetite, nausea, paresthesia of the feet, polydipsia and polyuria. She does check fasting glucose, ranging 145-183, rare in 130s. Last A1C in the office was:  Lab Results  Component Value Date   HGBA1C 7.4 (H) 04/11/2020   Last GFR: Lab Results  Component Value  Date   GFRNONAA 90 06/27/2020   Patient is not on Vitamin D supplement, forgets to take, has been recommended 4000 IU.  Lab Results  Component Value Date   VD25OH 12 (L) 08/15/2019     She has intermittent ongoing mild polycythemia for many years; had normal iron/ferritin 06/2020, smear review on 06/27/2020 showed Myeloid population consists predominantly of mature segmented neutrophils with mild reactive changes. No immature cells, adequate platelets, Erythrocytosis with unremarkable RBC morphology.  CBC Latest Ref Rng & Units 07/04/2020 06/27/2020 04/11/2020  WBC 3.8 - 10.8 Thousand/uL 11.5(H) 7.1 7.0  Hemoglobin 11.7 - 15.5 g/dL 16.2(H) 16.7(H) 16.1(H)  Hematocrit 35 - 45 % 47.4(H) 49.4(H) 47.5(H)  Platelets 140 - 400 Thousand/uL 267 229 223   Lab Results  Component Value Date   IRON 126 06/27/2020   TIBC 352 08/15/2019   FERRITIN 179 06/27/2020   She admits hasn't been taking supplement, requests defer check, will start  sublingual Lab Results  Component Value Date   VITAMINB12 340 08/15/2019      Current Medications:  Current Outpatient Medications on File Prior to Visit  Medication Sig Dispense Refill  . aspirin EC 81 MG tablet Take 81 mg by mouth daily.    . Cholecalciferol (VITAMIN D3) 2000 units TABS Take 4,000 Units by mouth.  (Patient not taking: Reported on 08/29/2020)    . citalopram (CELEXA) 40 MG tablet TAKE 1 TABLET EACH DAY. 90 tablet 1  . Dulaglutide ([name]) 3 MG/0.5ML SOPN Inject 3 mg into the skin once a week. 4 pen 11  . empagliflozin ([name]) 25 MG TABS tablet Take 25 mg by mouth daily. 90 tablet 3  . glipiZIDE (GLUCOTROL) 5 MG tablet Take 10 mg before breakfast and 5 mg before midnight 270 tablet 3  . glucose blood (ONETOUCH VERIO) test strip Use 2x a day 200 each 3  . meclizine (ANTIVERT) 25 MG tablet 1/2-1 pill up to 3 times daily for motion sickness/dizziness 30 tablet 0  . montelukast (SINGULAIR) 10 MG tablet TAKE ONE TABLET AT BEDTIME. 90 tablet 3   . OneTouch Delica Lancets 33G MISC Check 2x a day 200 each 3  . OVER THE COUNTER MEDICATION Takes an OTC Allertec    . rosuvastatin (CRESTOR) 40 MG tablet Take 1 tablet Daily for Cholesterol 90 tablet 3   No current facility-administered medications on file prior to visit.   Allergies:  Allergies  Allergen Reactions  . Levaquin [Levofloxacin] Hives and Other (See Comments)    Tingling sensation to mouth, near LOC, blurred vision,    . Iodine     Unknown reaction   . Levofloxacin In D5w Hives    Hives  . Metformin And Related Diarrhea  . Shellfish Allergy     Unknown reaction   . Vitamin D Analogs Diarrhea    High dose Vitamin D caused diarrhea  . Amoxicillin Rash  . Eggs Or Egg-Derived Products     Low, by skin test only, local itching when injected   . Kombiglyze [Saxagliptin-Metformin Er] Other (See Comments)    Bloating  . Latex Itching and Rash  . Penicillins Rash   Medical History:  She has Former smoker; Diabetes mellitus (HCC); Hyperlipidemia associated with type 2 diabetes mellitus (HCC); Generalized anxiety disorder; Vitamin D deficiency; Medication management; Obesity (BMI 30.0-34.9); CKD stage 2 due to type 2 diabetes mellitus (HCC); and Polycythemia on their problem list. Health Maintenance:   Immunization History  Administered Date(s) Administered  . Influenza Inj Mdck Quad With Preservative 08/15/2019  . Tdap 02/14/2013    Tetanus: 2014 Pneumovax: ? 2014, reports has had, repeat age 30 Prevnar 77:  Flu vaccine: 2020 TODAY Shingrix: - will check with insurance Covid 19: did get J&J, will send info  LMP: No LMP recorded. Patient is postmenopausal. Pap: 07/2020, gets annually at GYN, has scheduled - requested she forward report MGM: 08/01/2020 at GYN DEXA: n/a  Colonoscopy: 12/2010, due 2022 EGD: n/a  Last Dental Exam: Dr. Regino Schultze at [facility], last 2018, overdue to schedule  Last Eye Exam: Dr. Allena Katz, [facility], last 10/12/2019 - report in chart  and abstracted   Patient Care Team: Lucky Cowboy, MD as PCP - General (Internal Medicine) Henreitta Leber, PA-C as Physician Assistant (Obstetrics and Gynecology)  Surgical History:  She has a past surgical history that includes Cesarean section; Tonsillectomy; Appendectomy; and Cholecystectomy. Family History:  Herfamily history includes AAA (abdominal aortic aneurysm) in her mother; Brain cancer (age  of onset: 7545) in her maternal grandmother; Cancer in her sister; Diabetes in her sister; Heart attack in her maternal grandfather; Heart attack (age of onset: 10455) in her brother; Heart disease in her sister; Hyperlipidemia in her mother. Social History:  She reports that she quit smoking about 2 years ago. Her smoking use included cigarettes. She started smoking about 42 years ago. She has a 10.00 pack-year smoking history. She has never used smokeless tobacco. She reports that she does not drink alcohol and does not use drugs.  Review of Systems: Review of Systems  Constitutional: Negative for malaise/fatigue and weight loss.  HENT: Negative for hearing loss and tinnitus.   Eyes: Negative for blurred vision and double vision.  Respiratory: Negative for cough, sputum production, shortness of breath and wheezing.   Cardiovascular: Negative for chest pain, palpitations, orthopnea, claudication, leg swelling and PND.  Gastrointestinal: Negative for abdominal pain, blood in stool, constipation, diarrhea, heartburn, melena, nausea and vomiting.  Genitourinary: Negative.   Musculoskeletal: Negative for falls, joint pain and myalgias.  Skin: Negative for rash.  Neurological: Negative for dizziness, tingling, sensory change, weakness and headaches.  Endo/Heme/Allergies: Negative for polydipsia.  Psychiatric/Behavioral: Negative.  Negative for depression, memory loss, substance abuse and suicidal ideas. The patient is not nervous/anxious and does not have insomnia.   All other systems reviewed and  are negative.   Physical Exam: Estimated body mass index is 31.46 kg/m as calculated from the following:   Height as of this encounter: 5' 2.75" (1.594 m).   Weight as of this encounter: 176 lb 3.2 oz (79.9 kg). BP 110/68   Pulse 89   Temp (!) 96.1 F (35.6 C)   Ht 5' 2.75" (1.594 m)   Wt 176 lb 3.2 oz (79.9 kg)   SpO2 97%   BMI 31.46 kg/m  General Appearance: Well nourished, in no apparent distress.  Eyes: PERRLA, EOMs, conjunctiva no swelling or erythema, fundal exam deferred to ophth  Sinuses: No Frontal/maxillary tenderness  ENT/Mouth: Ext aud canals clear, normal light reflex with TMs without erythema, bulging. Good dentition. No erythema, swelling, or exudate on post pharynx. Tonsils not swollen or erythematous. Hearing normal.  Neck: Supple, thyroid normal. No bruits  Respiratory: Respiratory effort normal, BS equal bilaterally without rales, rhonchi, wheezing or stridor.  Cardio: RRR without murmurs, rubs or gallops. Brisk peripheral pulses without edema.  Chest: symmetric, with normal excursions and percussion.  Breasts: Defer to GYN Abdomen: Soft, nontender, no guarding, rebound, hernias, masses, or organomegaly.  Lymphatics: Non tender without lymphadenopathy.  Genitourinary: Defer to GYN Musculoskeletal: Full ROM all peripheral extremities,5/5 strength, and normal gait.  Skin: Warm, dry without rashes, lesions, ecchymosis. Neuro: Cranial nerves intact, reflexes equal bilaterally. Normal muscle tone, no cerebellar symptoms. Sensation intact.  Psych: Awake and oriented X 3, normal affect, Insight and Judgment appropriate.   EKG: WNL no ST changes.  AAA US: WNL 08/15/2019  Candice [name] 10:21 AM [facility]

## 2020-08-29 ENCOUNTER — Ambulatory Visit: Payer: 59 | Admitting: Adult Health

## 2020-08-29 ENCOUNTER — Other Ambulatory Visit: Payer: Self-pay

## 2020-08-29 ENCOUNTER — Encounter: Payer: Self-pay | Admitting: Adult Health

## 2020-08-29 VITALS — BP 110/68 | HR 89 | Temp 96.1°F | Ht 62.75 in | Wt 176.2 lb

## 2020-08-29 DIAGNOSIS — E66811 Obesity, class 1: Secondary | ICD-10-CM

## 2020-08-29 DIAGNOSIS — Z1329 Encounter for screening for other suspected endocrine disorder: Secondary | ICD-10-CM

## 2020-08-29 DIAGNOSIS — F411 Generalized anxiety disorder: Secondary | ICD-10-CM

## 2020-08-29 DIAGNOSIS — Z79899 Other long term (current) drug therapy: Secondary | ICD-10-CM

## 2020-08-29 DIAGNOSIS — E785 Hyperlipidemia, unspecified: Secondary | ICD-10-CM

## 2020-08-29 DIAGNOSIS — E669 Obesity, unspecified: Secondary | ICD-10-CM

## 2020-08-29 DIAGNOSIS — I10 Essential (primary) hypertension: Secondary | ICD-10-CM

## 2020-08-29 DIAGNOSIS — Z0000 Encounter for general adult medical examination without abnormal findings: Secondary | ICD-10-CM

## 2020-08-29 DIAGNOSIS — Z23 Encounter for immunization: Secondary | ICD-10-CM | POA: Diagnosis not present

## 2020-08-29 DIAGNOSIS — Z136 Encounter for screening for cardiovascular disorders: Secondary | ICD-10-CM | POA: Diagnosis not present

## 2020-08-29 DIAGNOSIS — Z1389 Encounter for screening for other disorder: Secondary | ICD-10-CM

## 2020-08-29 DIAGNOSIS — D751 Secondary polycythemia: Secondary | ICD-10-CM

## 2020-08-29 DIAGNOSIS — Z87891 Personal history of nicotine dependence: Secondary | ICD-10-CM | POA: Diagnosis not present

## 2020-08-29 DIAGNOSIS — E1122 Type 2 diabetes mellitus with diabetic chronic kidney disease: Secondary | ICD-10-CM

## 2020-08-29 DIAGNOSIS — E538 Deficiency of other specified B group vitamins: Secondary | ICD-10-CM

## 2020-08-29 DIAGNOSIS — E1169 Type 2 diabetes mellitus with other specified complication: Secondary | ICD-10-CM

## 2020-08-29 DIAGNOSIS — E559 Vitamin D deficiency, unspecified: Secondary | ICD-10-CM

## 2020-08-29 DIAGNOSIS — N182 Chronic kidney disease, stage 2 (mild): Secondary | ICD-10-CM

## 2020-08-29 MED ORDER — GLIPIZIDE 5 MG PO TABS: ORAL_TABLET | ORAL | 3 refills | Status: DC

## 2020-08-29 MED ORDER — B-12 1000 MCG SL SUBL: 1.0000 | SUBLINGUAL_TABLET | Freq: Every day | SUBLINGUAL | 0 refills | Status: DC

## 2020-08-29 NOTE — Patient Instructions (Signed)
Candice [name] , Thank you for taking time to come for your Annual Wellness Visit. I appreciate your ongoing commitment to your health goals. Please review the following plan we discussed and let me know if I can assist you in the future.   These are the goals we discussed: Goals    . Exercise 150 min/wk Moderate Activity    . Fasting Blood Glucose <150    . HEMOGLOBIN A1C < 7.0    . LDL CALC < 70    . Weight (lb) < 160 lb (72.6 kg)       This is a list of the screening recommended for you and due dates:  Health Maintenance  Topic Date Due  . COVID-19 Vaccine (1) Never done  . Flu Shot  05/13/2020  . Urine Protein Check  08/14/2020  . Hemoglobin A1C  10/11/2020  . Eye exam for diabetics  10/11/2020  . Colon Cancer Screening  12/18/2020  . Complete foot exam   08/29/2021  . Mammogram  08/01/2022  . Tetanus Vaccine  02/15/2023  . Pap Smear  08/02/2023  . Pneumococcal vaccine  Completed  .  Hepatitis C: One time screening is recommended by Center for Disease Control  (CDC) for  adults born from [birth year] through 1963.   Completed  . HIV Screening  Discontinued    Please schedule dental appointment Annual eye exam   Colonoscopy due next year -   May add extra tap of glipizide as needed for improved sugar - decide based on if sugars are running higher, or eating larger meal for holiday, etc  Check with insurance about shingrix vaccine - can get at [facility]   Know what a healthy weight is for you (roughly BMI <25) and aim to maintain this  Aim for 7+ servings of fruits and vegetables daily  65-80+ fluid ounces of water or unsweet tea for healthy kidneys  Limit to max 1 drink of alcohol per day; avoid smoking/tobacco  Limit animal fats in diet for cholesterol and heart health - choose grass fed whenever available  Avoid highly processed foods, and foods high in saturated/trans fats  Aim for low stress - take time to unwind and care for your mental health  Aim for 150  min of moderate intensity exercise weekly for heart health, and weights twice weekly for bone health  Aim for 7-9 hours of sleep daily    Zoster Vaccine, Recombinant injection What is this medicine? ZOSTER VACCINE (ZOS ter vak SEEN) is used to prevent shingles in adults 62 years old and over. This vaccine is not used to treat shingles or nerve pain from shingles. This medicine may be used for other purposes; ask your health care provider or pharmacist if you have questions. COMMON BRAND NAME(S): [facility] What should I tell my health care provider before I take this medicine? They need to know if you have any of these conditions:  blood disorders or disease  cancer like leukemia or lymphoma  immune system problems or therapy  an unusual or allergic reaction to vaccines, other medications, foods, dyes, or preservatives  pregnant or trying to get pregnant  breast-feeding How should I use this medicine? This vaccine is for injection in a muscle. It is given by a health care professional. Talk to your pediatrician regarding the use of this medicine in children. This medicine is not approved for use in children. Overdosage: If you think you have taken too much of this medicine contact a poison control center  or emergency room at once. NOTE: This medicine is only for you. Do not share this medicine with others. What if I miss a dose? Keep appointments for follow-up (booster) doses as directed. It is important not to miss your dose. Call your doctor or health care professional if you are unable to keep an appointment. What may interact with this medicine?  medicines that suppress your immune system  medicines to treat cancer  steroid medicines like prednisone or cortisone This list may not describe all possible interactions. Give your health care provider a list of all the medicines, herbs, non-prescription drugs, or dietary supplements you use. Also tell them if you smoke, drink  alcohol, or use illegal drugs. Some items may interact with your medicine. What should I watch for while using this medicine? Visit your doctor for regular check ups. This vaccine, like all vaccines, may not fully protect everyone. What side effects may I notice from receiving this medicine? Side effects that you should report to your doctor or health care professional as soon as possible:  allergic reactions like skin rash, itching or hives, swelling of the face, lips, or tongue  breathing problems Side effects that usually do not require medical attention (report these to your doctor or health care professional if they continue or are bothersome):  chills  headache  fever  nausea, vomiting  redness, warmth, pain, swelling or itching at site where injected  tiredness This list may not describe all possible side effects. Call your doctor for medical advice about side effects. You may report side effects to FDA at 1-800-FDA-1088. Where should I keep my medicine? This vaccine is only given in a clinic, pharmacy, doctor's office, or other health care setting and will not be stored at home. NOTE: This sheet is a summary. It may not cover all possible information. If you have questions about this medicine, talk to your doctor, pharmacist, or health care provider.  2020 Elsevier/Gold Standard (2017-05-11 13:20:30)

## 2020-08-31 LAB — LIPID PANEL
Cholesterol: 182 mg/dL (ref <200)
HDL: 42 mg/dL — ABNORMAL LOW (ref >=50)
LDL Cholesterol (Calc): 103 mg/dL (calc) — ABNORMAL HIGH
Non-HDL Cholesterol (Calc): 140 mg/dL (calc) — ABNORMAL HIGH (ref <130)
Total CHOL/HDL Ratio: 4.3 (calc) (ref <5.0)
Triglycerides: 260 mg/dL — ABNORMAL HIGH (ref <150)

## 2020-08-31 LAB — VITAMIN D 25 HYDROXY (VIT D DEFICIENCY, FRACTURES): Vit D, 25-Hydroxy: 12 ng/mL — ABNORMAL LOW (ref 30–100)

## 2020-08-31 LAB — CBC WITH DIFFERENTIAL/PLATELET
Absolute Monocytes: 536 cells/uL (ref 200–950)
Basophils Absolute: 38 cells/uL (ref 0–200)
Basophils Relative: 0.6 %
Eosinophils Absolute: 88 cells/uL (ref 15–500)
Eosinophils Relative: 1.4 %
HCT: 47.9 % — ABNORMAL HIGH (ref 35.0–45.0)
Hemoglobin: 16.3 g/dL — ABNORMAL HIGH (ref 11.7–15.5)
Lymphs Abs: 1436 cells/uL (ref 850–3900)
MCH: 32.3 pg (ref 27.0–33.0)
MCHC: 34 g/dL (ref 32.0–36.0)
MCV: 94.9 fL (ref 80.0–100.0)
MPV: 10.6 fL (ref 7.5–12.5)
Monocytes Relative: 8.5 %
Neutro Abs: 4202 cells/uL (ref 1500–7800)
Neutrophils Relative %: 66.7 %
Platelets: 231 10*3/uL (ref 140–400)
RBC: 5.05 10*6/uL (ref 3.80–5.10)
RDW: 12.5 % (ref 11.0–15.0)
Total Lymphocyte: 22.8 %
WBC: 6.3 10*3/uL (ref 3.8–10.8)

## 2020-08-31 LAB — COMPLETE METABOLIC PANEL WITH GFR
AG Ratio: 2.1 (calc) (ref 1.0–2.5)
ALT: 21 U/L (ref 6–29)
AST: 18 U/L (ref 10–35)
Albumin: 5 g/dL (ref 3.6–5.1)
Alkaline phosphatase (APISO): 107 U/L (ref 37–153)
BUN: 13 mg/dL (ref 7–25)
CO2: 26 mmol/L (ref 20–32)
Calcium: 10.4 mg/dL (ref 8.6–10.4)
Chloride: 104 mmol/L (ref 98–110)
Creat: 0.68 mg/dL (ref 0.50–0.99)
GFR, Est African American: 109 mL/min/{1.73_m2} (ref >=60)
GFR, Est Non African American: 94 mL/min/{1.73_m2} (ref >=60)
Globulin: 2.4 g/dL (calc) (ref 1.9–3.7)
Glucose, Bld: 185 mg/dL — ABNORMAL HIGH (ref 65–99)
Potassium: 5.5 mmol/L — ABNORMAL HIGH (ref 3.5–5.3)
Sodium: 142 mmol/L (ref 135–146)
Total Bilirubin: 0.6 mg/dL (ref 0.2–1.2)
Total Protein: 7.4 g/dL (ref 6.1–8.1)

## 2020-08-31 LAB — URINALYSIS, ROUTINE W REFLEX MICROSCOPIC
Bilirubin Urine: NEGATIVE
Hgb urine dipstick: NEGATIVE
Ketones, ur: NEGATIVE
Leukocytes,Ua: NEGATIVE
Nitrite: NEGATIVE
Protein, ur: NEGATIVE
Specific Gravity, Urine: 1.039 — ABNORMAL HIGH (ref 1.001–1.03)
pH: <=5 (ref 5.0–8.0)

## 2020-08-31 LAB — MICROALBUMIN / CREATININE URINE RATIO
Creatinine, Urine: 50 mg/dL (ref 20–275)
Microalb Creat Ratio: 8 mcg/mg creat (ref <30)
Microalb, Ur: 0.4 mg/dL

## 2020-08-31 LAB — VITAMIN B12: Vitamin B-12: 312 pg/mL (ref 200–1100)

## 2020-08-31 LAB — MAGNESIUM: Magnesium: 2.5 mg/dL (ref 1.5–2.5)

## 2020-08-31 LAB — HEMOGLOBIN A1C
Hgb A1c MFr Bld: 8.1 % of total Hgb — ABNORMAL HIGH (ref <5.7)
Mean Plasma Glucose: 186 (calc)
eAG (mmol/L): 10.3 (calc)

## 2020-08-31 LAB — TSH: TSH: 0.91 mIU/L (ref 0.40–4.50)

## 2020-08-31 LAB — ERYTHROPOIETIN: Erythropoietin: 8.8 m[IU]/mL (ref 2.6–18.5)

## 2020-09-03 ENCOUNTER — Encounter: Payer: Self-pay | Admitting: Internal Medicine

## 2020-09-03 ENCOUNTER — Other Ambulatory Visit: Payer: Self-pay

## 2020-09-03 ENCOUNTER — Ambulatory Visit: Payer: 59 | Admitting: Internal Medicine

## 2020-09-03 VITALS — Temp 98.0°F | Ht 62.75 in

## 2020-09-03 DIAGNOSIS — R319 Hematuria, unspecified: Secondary | ICD-10-CM | POA: Diagnosis not present

## 2020-09-03 DIAGNOSIS — R350 Frequency of micturition: Secondary | ICD-10-CM

## 2020-09-03 DIAGNOSIS — R300 Dysuria: Secondary | ICD-10-CM

## 2020-09-03 DIAGNOSIS — M546 Pain in thoracic spine: Secondary | ICD-10-CM

## 2020-09-03 DIAGNOSIS — K5792 Diverticulitis of intestine, part unspecified, without perforation or abscess without bleeding: Secondary | ICD-10-CM | POA: Diagnosis not present

## 2020-09-03 MED ORDER — DOXYCYCLINE HYCLATE 100 MG PO TABS: 100.0000 mg | ORAL_TABLET | Freq: Two times a day (BID) | ORAL | 0 refills | Status: DC

## 2020-09-03 NOTE — Patient Instructions (Signed)
Start doxycycline 100 mg twice daily for 7 days.  Urine culture sent and results are pending.

## 2020-09-03 NOTE — Progress Notes (Signed)
° °  Subjective:    [name] ID: Candice [name], female    DOB: [birth year], 64 y.o.   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  Couple of days ago she had a single episode of hematuria without pain.  Denies fever, shaking chills, nausea, vomiting.  She does have a history of diabetes mellitus.  Now has dysuria and some left-sided back pain. Had dysuria in June and right CVA pain as well as right lower quadrant pain.  Suspected [name] had a UTI at the time but dipstick urine specimen was normal.  Culture showed less than 10,000 colony-forming units per cc gram-positive organism.  And she was treated with doxycycline for 7 days.  She responded to doxycycline and felt better. She had a urine dipstick done on November 17 showing concentrated specific gravity 1.039, 3+ glucose but no LE or nitrite  Review of Systems see above-Hemoglobin A1c on November 17 was 8.1%     Objective:   Physical Exam She is afebrile.  She has left-sided CVA tenderness. Urine dipstick positive for leukocytes and nitrite as well as occult blood.  Culture sent.     Assessment & Plan:  Painless hematuria followed by development of left CVA pain and some dysuria.  Suspect UTI.  Plan: Urine culture sent and results are pending.  Start doxycycline 100 mg twice daily for 7 days pending culture results.

## 2020-09-05 LAB — URINE CULTURE
MICRO NUMBER:: 11232915
SPECIMEN QUALITY:: ADEQUATE

## 2020-09-05 LAB — URINALYSIS, MICROSCOPIC ONLY
Hyaline Cast: NONE SEEN /LPF
Squamous Epithelial / HPF: NONE SEEN /HPF (ref <=5)
WBC, UA: >=60 /HPF — AB (ref 0–5)

## 2020-09-12 ENCOUNTER — Other Ambulatory Visit: Payer: Self-pay

## 2020-09-12 ENCOUNTER — Other Ambulatory Visit: Payer: Self-pay | Admitting: Adult Health

## 2020-09-12 DIAGNOSIS — E1169 Type 2 diabetes mellitus with other specified complication: Secondary | ICD-10-CM

## 2020-09-12 MED ORDER — TRULICITY 3 MG/0.5ML ~~LOC~~ SOAJ: 3.0000 mg | SUBCUTANEOUS | 2 refills | Status: DC

## 2020-09-12 MED ORDER — EMPAGLIFLOZIN 25 MG PO TABS: 25.0000 mg | ORAL_TABLET | Freq: Every day | ORAL | 3 refills | Status: DC

## 2020-10-25 LAB — HM PAP SMEAR

## 2020-11-23 ENCOUNTER — Other Ambulatory Visit: Payer: Self-pay

## 2020-11-23 ENCOUNTER — Encounter: Payer: Self-pay | Admitting: Internal Medicine

## 2020-11-23 ENCOUNTER — Ambulatory Visit: Payer: 59 | Admitting: Internal Medicine

## 2020-11-23 VITALS — Ht 62.75 in

## 2020-11-23 DIAGNOSIS — R059 Cough, unspecified: Secondary | ICD-10-CM | POA: Diagnosis not present

## 2020-11-23 DIAGNOSIS — H6693 Otitis media, unspecified, bilateral: Secondary | ICD-10-CM

## 2020-11-23 MED ORDER — METHYLPREDNISOLONE ACETATE 80 MG/ML IJ SUSP
80.0000 mg | Freq: Once | INTRAMUSCULAR | Status: AC
  Administered 2020-11-23: 80 mg via INTRAMUSCULAR

## 2020-11-23 MED ORDER — DOXYCYCLINE HYCLATE 100 MG PO TABS: 100.0000 mg | ORAL_TABLET | Freq: Two times a day (BID) | ORAL | 0 refills | Status: DC

## 2020-11-23 MED ORDER — FLUCONAZOLE 150 MG PO TABS: 150.0000 mg | ORAL_TABLET | Freq: Once | ORAL | 0 refills | Status: AC

## 2020-11-23 NOTE — Progress Notes (Signed)
   Subjective:    Candice [name] ID: Candice Candice [name], female    DOB: [birth year], 65 y.o.   MRN: [id number]  HPI 65 year old Female asked to be seen regarding persistent fluid in right ear.  Has popping in right ear.  Covid test was obtained.  This proved to be negative.  Denies fever chills or dysgeusia.  Symptoms have been present for a few days and they are annoying.  Denies fever or chills.  No cough.  Candice [name] does have type 2 diabetes mellitus.  Review of Systems see above     Objective:   Physical Exam Candice [name] is afebrile.  TMs are full bilaterally.  They are slightly dull but not red.  Neck is supple.  Chest clear.       Assessment & Plan:  Acute bilateral serous otitis media  Plan: Doxycycline 100 mg twice daily for 10 days.  COVID-19 test pending.  Depo-Medrol 80 mg IM for ear congestion.  Addendum: COVID-19 test is negative

## 2020-11-24 LAB — SARS-COV-2 RNA,(COVID-19) QUALITATIVE NAAT: SARS CoV2 RNA: NOT DETECTED

## 2020-12-05 ENCOUNTER — Ambulatory Visit: Payer: 59 | Admitting: Adult Health

## 2020-12-07 ENCOUNTER — Telehealth: Payer: Self-pay | Admitting: Internal Medicine

## 2020-12-07 ENCOUNTER — Encounter: Payer: Self-pay | Admitting: Internal Medicine

## 2020-12-07 DIAGNOSIS — H6690 Otitis media, unspecified, unspecified ear: Secondary | ICD-10-CM

## 2020-12-07 MED ORDER — DOXYCYCLINE HYCLATE 100 MG PO TABS: 100.0000 mg | ORAL_TABLET | Freq: Two times a day (BID) | ORAL | 0 refills | Status: DC

## 2020-12-07 NOTE — Telephone Encounter (Signed)
Still has ear congestion bilaterally. Refill Doxycycline x 10 days. If no improvement, refer to ENT.

## 2020-12-10 ENCOUNTER — Encounter: Payer: Self-pay | Admitting: Internal Medicine

## 2020-12-10 NOTE — Patient Instructions (Signed)
Depo-Medrol 80 mg IM.  Doxycycline 100 mg twice daily for 10 days.  COVID-19 test proved to be negative.

## 2020-12-28 ENCOUNTER — Other Ambulatory Visit: Payer: Self-pay | Admitting: Adult Health

## 2020-12-28 DIAGNOSIS — E1169 Type 2 diabetes mellitus with other specified complication: Secondary | ICD-10-CM

## 2021-01-07 NOTE — Progress Notes (Signed)
FOLLOW UP  Assessment and Plan:   Cholesterol Has been at goal with rosuvastatin 40 mg daily, reports improved compliance with pill boxy Continue low cholesterol diet and exercise.  Defer lipid panel today per patient preference   Diabetes with diabetic chronic kidney disease Continue medication: jardiance, trulicity 3 mg weekly, glipizide 5 mg TID with meals, increase to 6.5-10 mg for largest meals, mechanism discussed Intolerance of metformin No longer following with Dr. Elvera Lennox RESTART CHECKING FASTING GLUCOSE ,goal set Continue diet and exercise.  Perform daily foot/skin check, notify office of any concerning changes.  Check A1C  Obesity with co morbidities Long discussion about weight loss, diet, and exercise Recommended diet heavy in fruits and veggies and low in animal meats, cheeses, and dairy products, appropriate calorie intake Discussed ideal weight for height Patient will work on consistency with diet, increase exercise, reduce diet soda, increase water Will follow up in 3 months  Vitamin D Def Very low at last visit; admits still not supplementing regularly, strategies discussed Defer Vit D level, continue to counsel   Anxiety Doing well with Celexa 20 mg  Stress management techniques discussed, increase water, good sleep hygiene discussed, increase exercise, and increase veggies.   Former smoker Doing well with cessation since Jan 2019;  Abnormal LLL lung sounds and cough; CXR ordered  Continue diet and meds as discussed. Further disposition pending results of labs. Discussed med's effects and SE's.   Over 30 minutes of exam, counseling, chart review, and critical decision making was performed.   Future Appointments  Date Time Provider Department Center  03/13/2021 10:30 AM Judd Gaudier, NP [facility] None  08/29/2021 10:00 AM Judd Gaudier, NP [facility] None    ----------------------------------------------------------------------------------------------------------------------  HPI 65 y.o. female  presents for 3 month follow up on cholesterol, T2 diabetes with CKD, obesity and vitamin D deficiency.   She reported increased stress related to her daughter going through a divorce/custody battle, also pregnant due in May, taking celexa 20 mg and doing well.    BMI is Body mass index is 31.43 kg/m., she has been working on diet and exercise but admits has been in a funk.  She was walking 20-30 min, plans to restart with the weather warming up. She also working on diet, doing roasted veggies most nights of the week  Thinking about noom as husband is doing well with this  She drinks 1 coffee in the AM, reports 3 cans of diet soda daily, trying to reduce and increase water intake Wt Readings from Last 3 Encounters:  01/09/21 176 lb (79.8 kg)  08/29/20 176 lb 3.2 oz (79.9 kg)  07/04/20 178 lb (80.7 kg)   Today their BP is BP: 124/70 Declines ACEi/ARB for now, low BPs at home.   She does workout. She denies chest pain, shortness of breath, dizziness.   She is on cholesterol medication (rosuvastatin 40 mg daily, reports doing better remembering to take daily using pill box) and denies myalgias. Her cholesterol is not at goal. The cholesterol last visit was:   Lab Results  Component Value Date   CHOL 182 08/29/2020   HDL 42 (L) 08/29/2020   LDLCALC 103 (H) 08/29/2020   TRIG 260 (H) 08/29/2020   CHOLHDL 4.3 08/29/2020    She has not been working on diet and exercise for T2 diabetes ([name] 25 mg, trulicity 3 mg weekly, glipizide 10 mg AM, 5 mg PM, hx of metformin intolerance), no longer following , and denies hypoglycemia , increased appetite, nausea, paresthesia of  the feet, polydipsia, polyuria, visual disturbances and vomiting.  She admits hasn't been checking AM sugars regularly,  Last A1C in the office was:  Lab Results  Component Value  Date   HGBA1C 8.1 (H) 08/29/2020   She has CKD I/II associated with T2DM monitored at this office; pt declined ACEi/ARB, normal BPs. Last GFR:   Lab Results  Component Value Date   GFRNONAA 94 08/29/2020   GFRNONAA 90 06/27/2020   GFRNONAA 75 04/11/2020   Patient is on Vitamin D supplement, taking 4000 IU but admits inconsistently, hasn't been taking in recent weeks, plans to restart with liquid formulation, was very low at the last check:    Lab Results  Component Value Date   VD25OH 12 (L) 08/29/2020     Admits hasn't been supplementing, plans to start Lab Results  Component Value Date   VITAMINB12 312 08/29/2020      Current Medications:  Current Outpatient Medications on File Prior to Visit  Medication Sig  . aspirin EC 81 MG tablet Take 81 mg by mouth daily.  . Cholecalciferol (VITAMIN D3) 2000 units TABS Take 4,000 Units by mouth.  . citalopram (CELEXA) 40 MG tablet TAKE 1 TABLET EACH DAY.  Marland Kitchen Cyanocobalamin (B-12) 1000 MCG SUBL Place 1 tablet under the tongue daily.  Marland Kitchen glipiZIDE (GLUCOTROL) 5 MG tablet Take 10 mg before breakfast and 5-10 mg before midnight  . glucose blood (ONETOUCH VERIO) test strip Use 2x a day  . [name] 25 MG TABS tablet TAKE 1 TABLET ONCE DAILY.  . meclizine (ANTIVERT) 25 MG tablet 1/2-1 pill up to 3 times daily for motion sickness/dizziness  . montelukast (SINGULAIR) 10 MG tablet TAKE ONE TABLET AT BEDTIME.  Letta Pate Delica Lancets 33G MISC Check 2x a day  . OVER THE COUNTER MEDICATION Takes an OTC Allertec  . rosuvastatin (CRESTOR) 40 MG tablet Take 1 tablet Daily for Cholesterol  . [name] 3 MG/0.5ML SOPN INJECT 3MG  INTO THE SKIN ONCE A WEEK AS DIRECTED.  doxycycline (VIBRA-TABS) 100 MG tablet Take 1 tablet (100 mg total) by mouth 2 (two) times daily.   No current facility-administered medications on file prior to visit.     Allergies:  Allergies  Allergen Reactions  . Levaquin [Levofloxacin] Hives and Other (See Comments)     Tingling sensation to mouth, near LOC, blurred vision,    . Iodine     Unknown reaction   . Levofloxacin In D5w Hives    Hives  . Metformin And Related Diarrhea  . Shellfish Allergy     Unknown reaction   . Vitamin D Analogs Diarrhea    High dose Vitamin D caused diarrhea  . Amoxicillin Rash  . Eggs Or Egg-Derived Products     Low, by skin test only, local itching when injected   . Kombiglyze [Saxagliptin-Metformin Er] Other (See Comments)    Bloating  . Latex Itching and Rash  . Penicillins Rash     Medical History:  Past Medical History:  Diagnosis Date  . Anxiety   . Diabetes mellitus without complication (HCC)   . Hx of colonic polyps    Fhx Colon cancer  . Hyperlipidemia   . Hypocholesteremia   . Tobacco abuse    Family history- Reviewed and unchanged Social history- Reviewed and unchanged   Review of Systems:  Review of Systems  Constitutional: Negative for malaise/fatigue and weight loss.  HENT: Negative for hearing loss, sore throat and tinnitus.   Eyes: Negative for blurred vision and  double vision.  Respiratory: Negative for cough, sputum production, shortness of breath and wheezing.   Cardiovascular: Negative for chest pain, palpitations, orthopnea, claudication and leg swelling.  Gastrointestinal: Negative for abdominal pain, blood in stool, constipation, diarrhea, heartburn, melena, nausea and vomiting.  Genitourinary: Negative.   Musculoskeletal: Negative for joint pain, myalgias and neck pain.  Skin: Negative for rash.  Neurological: Negative for dizziness, tingling, sensory change, focal weakness, weakness and headaches.  Endo/Heme/Allergies: Negative for polydipsia.  Psychiatric/Behavioral: Negative for depression and substance abuse. The patient is not nervous/anxious and does not have insomnia.   All other systems reviewed and are negative.   Physical Exam: BP 124/70   Pulse 83   Temp (!) 96.2 F (35.7 C)   Ht 5' 2.75" (1.594 m)   Wt 176  lb (79.8 kg)   SpO2 95%   BMI 31.43 kg/m  Wt Readings from Last 3 Encounters:  01/09/21 176 lb (79.8 kg)  08/29/20 176 lb 3.2 oz (79.9 kg)  07/04/20 178 lb (80.7 kg)   General Appearance: Well nourished, in no apparent distress. Eyes: PERRLA, EOMs, conjunctiva no swelling or erythema Sinuses: No Frontal/maxillary tenderness ENT/Mouth: Ext aud canals clear, TMs without erythema, bulging. No erythema, swelling, or exudate on post pharynx.  Tonsils not swollen or erythematous. Hearing normal.  Neck: Supple, thyroid normal.  Respiratory: Respiratory effort normal, BS equal bilaterally without rales, rhonchi, wheezing or stridor.  Cardio: RRR with no MRGs. Brisk peripheral pulses without edema.  Abdomen: Soft, + BS.  Non tender, no guarding, rebound, hernias, masses. Lymphatics: Non tender without lymphadenopathy.  Musculoskeletal: Full active ROM intact, upper extremity strength somewhat limited by pain with internal rotation of shoulder, with triceps flexion and with internal shoulder rotation, reported palpable lump is not present though with tenderness over lateral triceps. Normal gait.  Skin: Warm, dry without rashes, lesions, ecchymosis.  Neuro: Cranial nerves intact. No cerebellar symptoms. Sensation intact Psych: Awake and oriented X 3, normal affect, Insight and Judgment appropriate.    Dan Maker, NP 12:01 PM [facility]

## 2021-01-09 ENCOUNTER — Ambulatory Visit (INDEPENDENT_AMBULATORY_CARE_PROVIDER_SITE_OTHER): Payer: 59 | Admitting: Adult Health

## 2021-01-09 ENCOUNTER — Other Ambulatory Visit: Payer: Self-pay

## 2021-01-09 ENCOUNTER — Encounter: Payer: Self-pay | Admitting: Adult Health

## 2021-01-09 VITALS — BP 124/70 | HR 83 | Temp 96.2°F | Ht 62.75 in | Wt 176.0 lb

## 2021-01-09 DIAGNOSIS — F411 Generalized anxiety disorder: Secondary | ICD-10-CM | POA: Diagnosis not present

## 2021-01-09 DIAGNOSIS — E538 Deficiency of other specified B group vitamins: Secondary | ICD-10-CM

## 2021-01-09 DIAGNOSIS — E1169 Type 2 diabetes mellitus with other specified complication: Secondary | ICD-10-CM

## 2021-01-09 DIAGNOSIS — R059 Cough, unspecified: Secondary | ICD-10-CM

## 2021-01-09 DIAGNOSIS — Z79899 Other long term (current) drug therapy: Secondary | ICD-10-CM | POA: Diagnosis not present

## 2021-01-09 DIAGNOSIS — E559 Vitamin D deficiency, unspecified: Secondary | ICD-10-CM

## 2021-01-09 DIAGNOSIS — E119 Type 2 diabetes mellitus without complications: Secondary | ICD-10-CM

## 2021-01-09 DIAGNOSIS — N182 Chronic kidney disease, stage 2 (mild): Secondary | ICD-10-CM

## 2021-01-09 DIAGNOSIS — E669 Obesity, unspecified: Secondary | ICD-10-CM

## 2021-01-09 DIAGNOSIS — E785 Hyperlipidemia, unspecified: Secondary | ICD-10-CM

## 2021-01-09 DIAGNOSIS — E1122 Type 2 diabetes mellitus with diabetic chronic kidney disease: Secondary | ICD-10-CM

## 2021-01-09 DIAGNOSIS — R0989 Other specified symptoms and signs involving the circulatory and respiratory systems: Secondary | ICD-10-CM

## 2021-01-09 MED ORDER — MECLIZINE HCL 25 MG PO TABS
ORAL_TABLET | ORAL | 0 refills | Status: DC
Start: 2021-01-09 — End: 2021-10-23

## 2021-01-09 MED ORDER — GLIPIZIDE 5 MG PO TABS: ORAL_TABLET | ORAL | 3 refills | Status: DC

## 2021-01-09 NOTE — Patient Instructions (Addendum)
Goals    . Exercise 150 min/wk Moderate Activity    . Fasting Blood Glucose <150    . HEMOGLOBIN A1C < 7.0    . LDL CALC < 70    . Weight (lb) < 160 lb (72.6 kg)        Look into next 56 days diet   Take 5 mg glipizide with each meal  Increase largest meal to 7.5 -10 mg if needed  Ideally want to see fasting <130, but start with <150   When in doubt, more fiber, check labels   High-Fiber Eating Plan Fiber, also called dietary fiber, is a type of carbohydrate. It is found foods such as fruits, vegetables, whole grains, and beans. A high-fiber diet can have many health benefits. Your health care provider may recommend a high-fiber diet to help:  Prevent constipation. Fiber can make your bowel movements more regular.  Lower your cholesterol.  Relieve the following conditions: ? Inflammation of veins in the anus (hemorrhoids). ? Inflammation of specific areas of the digestive tract (uncomplicated diverticulosis). ? A problem of the large intestine, also called the colon, that sometimes causes pain and diarrhea (irritable bowel syndrome, or IBS).  Prevent overeating as part of a weight-loss plan.  Prevent heart disease, type 2 diabetes, and certain cancers. What are tips for following this plan? Reading food labels  Check the nutrition facts label on food products for the amount of dietary fiber. Choose foods that have 5 grams of fiber or more per serving.  The goals for recommended daily fiber intake include: ? Men (age 32 or younger): 34-38 g. ? Men (over age 69): 28-34 g. ? Women (age 23 or younger): 25-28 g. ? Women (over age 73): 22-25 g. Your daily fiber goal is _____________ g.   Shopping  Choose whole fruits and vegetables instead of processed forms, such as apple juice or applesauce.  Choose a wide variety of high-fiber foods such as avocados, lentils, oats, and kidney beans.  Read the nutrition facts label of the foods you choose. Be aware of foods with  added fiber. These foods often have high sugar and sodium amounts per serving. Cooking  Use whole-grain flour for baking and cooking.  Cook with brown rice instead of white rice. Meal planning  Start the day with a breakfast that is high in fiber, such as a cereal that contains 5 g of fiber or more per serving.  Eat breads and cereals that are made with whole-grain flour instead of refined flour or white flour.  Eat brown rice, bulgur wheat, or millet instead of white rice.  Use beans in place of meat in soups, salads, and pasta dishes.  Be sure that half of the grains you eat each day are whole grains. General information  You can get the recommended daily intake of dietary fiber by: ? Eating a variety of fruits, vegetables, grains, nuts, and beans. ? Taking a fiber supplement if you are not able to take in enough fiber in your diet. It is better to get fiber through food than from a supplement.  Gradually increase how much fiber you consume. If you increase your intake of dietary fiber too quickly, you may have bloating, cramping, or gas.  Drink plenty of water to help you digest fiber.  Choose high-fiber snacks, such as berries, raw vegetables, nuts, and popcorn. What foods should I eat? Fruits Berries. Pears. Apples. Oranges. Avocado. Prunes and raisins. Dried figs. Vegetables Sweet potatoes. Spinach. Kale. Artichokes. Cabbage.  Broccoli. Cauliflower. Green peas. Carrots. Squash. Grains Whole-grain breads. Multigrain cereal. Oats and oatmeal. Brown rice. Barley. Bulgur wheat. Millet. Quinoa. Bran muffins. Popcorn. Rye wafer crackers. Meats and other proteins Navy beans, kidney beans, and pinto beans. Soybeans. Split peas. Lentils. Nuts and seeds. Dairy Fiber-fortified yogurt. Beverages Fiber-fortified soy milk. Fiber-fortified orange juice. Other foods Fiber bars. The items listed above may not be a complete list of recommended foods and beverages. Contact a dietitian  for more information. What foods should I avoid? Fruits Fruit juice. Cooked, strained fruit. Vegetables Fried potatoes. Canned vegetables. Well-cooked vegetables. Grains White bread. Pasta made with refined flour. White rice. Meats and other proteins Fatty cuts of meat. Fried chicken or fried fish. Dairy Milk. Yogurt. Cream cheese. Sour cream. Fats and oils Butters. Beverages Soft drinks. Other foods Cakes and pastries. The items listed above may not be a complete list of foods and beverages to avoid. Talk with your dietitian about what choices are best for you. Summary  Fiber is a type of carbohydrate. It is found in foods such as fruits, vegetables, whole grains, and beans.  A high-fiber diet has many benefits. It can help to prevent constipation, lower blood cholesterol, aid weight loss, and reduce your risk of heart disease, diabetes, and certain cancers.  Increase your intake of fiber gradually. Increasing fiber too quickly may cause cramping, bloating, and gas. Drink plenty of water while you increase the amount of fiber you consume.  The best sources of fiber include whole fruits and vegetables, whole grains, nuts, seeds, and beans. This information is not intended to replace advice given to you by your health care provider. Make sure you discuss any questions you have with your health care provider. Document Revised: 02/02/2020 Document Reviewed: 02/02/2020 Elsevier Patient Education  2021 [employer].

## 2021-01-10 LAB — COMPLETE METABOLIC PANEL WITH GFR
AG Ratio: 2.2 (calc) (ref 1.0–2.5)
ALT: 23 U/L (ref 6–29)
AST: 15 U/L (ref 10–35)
Albumin: 5.2 g/dL — ABNORMAL HIGH (ref 3.6–5.1)
Alkaline phosphatase (APISO): 102 U/L (ref 37–153)
BUN: 17 mg/dL (ref 7–25)
CO2: 27 mmol/L (ref 20–32)
Calcium: 10.4 mg/dL (ref 8.6–10.4)
Chloride: 105 mmol/L (ref 98–110)
Creat: 0.78 mg/dL (ref 0.50–0.99)
GFR, Est African American: 94 mL/min/{1.73_m2} (ref >=60)
GFR, Est Non African American: 81 mL/min/{1.73_m2} (ref >=60)
Globulin: 2.4 g/dL (calc) (ref 1.9–3.7)
Glucose, Bld: 214 mg/dL — ABNORMAL HIGH (ref 65–99)
Potassium: 4.7 mmol/L (ref 3.5–5.3)
Sodium: 141 mmol/L (ref 135–146)
Total Bilirubin: 0.6 mg/dL (ref 0.2–1.2)
Total Protein: 7.6 g/dL (ref 6.1–8.1)

## 2021-01-10 LAB — HEMOGLOBIN A1C
Hgb A1c MFr Bld: 8 % of total Hgb — ABNORMAL HIGH (ref <5.7)
Mean Plasma Glucose: 183 mg/dL
eAG (mmol/L): 10.1 mmol/L

## 2021-01-10 LAB — MAGNESIUM: Magnesium: 2.3 mg/dL (ref 1.5–2.5)

## 2021-01-10 LAB — CBC WITH DIFFERENTIAL/PLATELET
Absolute Monocytes: 525 cells/uL (ref 200–950)
Basophils Absolute: 50 cells/uL (ref 0–200)
Basophils Relative: 0.7 %
Eosinophils Absolute: 92 cells/uL (ref 15–500)
Eosinophils Relative: 1.3 %
HCT: 50.2 % — ABNORMAL HIGH (ref 35.0–45.0)
Hemoglobin: 16.4 g/dL — ABNORMAL HIGH (ref 11.7–15.5)
Lymphs Abs: 1740 cells/uL (ref 850–3900)
MCH: 30.3 pg (ref 27.0–33.0)
MCHC: 32.7 g/dL (ref 32.0–36.0)
MCV: 92.8 fL (ref 80.0–100.0)
MPV: 10.7 fL (ref 7.5–12.5)
Monocytes Relative: 7.4 %
Neutro Abs: 4693 cells/uL (ref 1500–7800)
Neutrophils Relative %: 66.1 %
Platelets: 239 10*3/uL (ref 140–400)
RBC: 5.41 10*6/uL — ABNORMAL HIGH (ref 3.80–5.10)
RDW: 12.3 % (ref 11.0–15.0)
Total Lymphocyte: 24.5 %
WBC: 7.1 10*3/uL (ref 3.8–10.8)

## 2021-01-10 LAB — LIPID PANEL
Cholesterol: 152 mg/dL (ref <200)
HDL: 44 mg/dL — ABNORMAL LOW (ref >=50)
LDL Cholesterol (Calc): 75 mg/dL (calc)
Non-HDL Cholesterol (Calc): 108 mg/dL (calc) (ref <130)
Total CHOL/HDL Ratio: 3.5 (calc) (ref <5.0)
Triglycerides: 237 mg/dL — ABNORMAL HIGH (ref <150)

## 2021-01-10 LAB — TSH: TSH: 0.94 mIU/L (ref 0.40–4.50)

## 2021-03-01 ENCOUNTER — Other Ambulatory Visit: Payer: Self-pay | Admitting: Adult Health

## 2021-03-13 ENCOUNTER — Ambulatory Visit: Payer: 59 | Admitting: Adult Health

## 2021-04-16 NOTE — Progress Notes (Signed)
FOLLOW UP  Assessment and Plan:   Cholesterol Has been at LDL <70 goal with rosuvastatin 40 mg daily, reports improved compliance with pill box Continue low cholesterol diet and exercise.  Defer lipid panel today per patient preference   Diabetes with diabetic chronic kidney disease Continue medication: jardiance, trulicity 3 mg weekly, glipizide 5-10 mg TID with meals, increase to 7.5-10 mg for largest meals, mechanism discussed  Intolerance of metformin No longer following with Dr. Elvera Lennox RESTART CHECKING FASTING GLUCOSE, goal set Start keeping glucose log and food log, sheets given, follow up in 4-6 weeks for diabetes education visit Continue diet and exercise.  Perform daily foot/skin check, notify office of any concerning changes.  Check A1C  Obesity with co morbidities Long discussion about weight loss, diet, and exercise Recommended diet heavy in fruits and veggies and low in animal meats, cheeses, and dairy products, appropriate calorie intake Discussed ideal weight for height Patient will work on consistency with diet, increase exercise, reduce soda, increase water Will follow up in 3 months  Vitamin D Def Very low at last visit; admits still not supplementing regularly, strategies discussed Defer Vit D level, continue to counsel taken in PM, put in pill box  Anxiety Doing well off of meds Stress management techniques discussed, increase water, good sleep hygiene discussed, increase exercise, and increase veggies.   Former smoker Doing well with cessation since Jan 2019;  Get CXR PRN  Bilateral hip pain Some SI infolvement, mostly appears muscular sx Denies correlation with statin but will check CK to r/o No injury, discussed imaging but declines for now Will try NSAID - meloxicam daily x 2-4 weeks Increase gentle walking, stretches given  Continue diet and meds as discussed. Further disposition pending results of labs. Discussed med's effects and SE's.    Over 30 minutes of exam, counseling, chart review, and critical decision making was performed.   Future Appointments  Date Time Provider Department Center  08/29/2021 10:00 AM Judd Gaudier, NP [facility] None   ----------------------------------------------------------------------------------------------------------------------  HPI 65 y.o. female  presents for 3 month follow up on cholesterol, T2 diabetes with CKD, obesity and vitamin D deficiency.   She reported increased stress related to her daughter going through a divorce/custody battle, also had difficult birth in 02/2021. Has been stressful but patient reports is managing fairly well. She reports tapered off of celexa and still managing. Declines other med at this time.   She reports 3 weeks ago had sudden fatigue, weakness, leg pain -  Denies CP, dyspnea, edema. She reports persistent bilateral leg pain, worse at night. She notes lower back pain, L >R bilaterally, tender through muscles lateral thighs, has had some sense of difficulty walking. She reports has taken advil which will "knock the edge off". Denies rash, tick bite, numbness/tingling, weakness, saddle anesthesia, injury.   BMI is Body mass index is 31.43 kg/m., she has been working on diet and exercise but admits has been in a funk, reports had gained weight but lost back to baseline.  She reports reduced soda - 3 > 1, doing lots of water instead  Doing veggies but family full of picky eaters, hard to balance Wt Readings from Last 3 Encounters:  04/17/21 176 lb (79.8 kg)  01/09/21 176 lb (79.8 kg)  08/29/20 176 lb 3.2 oz (79.9 kg)   Today their BP is BP: 122/70 Declines ACEi/ARB for now, low BPs at home.   She does workout. She denies chest pain, shortness of breath, dizziness.   She  is on cholesterol medication (rosuvastatin 40 mg daily, reports doing better remembering to take daily using pill box) and denies myalgias. Her cholesterol is not at goal. The  cholesterol last visit was:   Lab Results  Component Value Date   CHOL 152 01/09/2021   HDL 44 (L) 01/09/2021   LDLCALC 75 01/09/2021   TRIG 237 (H) 01/09/2021   CHOLHDL 3.5 01/09/2021    She has not been working on diet and exercise for T2 diabetes ([name] 25 mg, trulicity 3 mg weekly, glipizide 10 mg AM, 5 mg with lunch intermittently and 5 mg with dinne, hx of metformin intolerance). She denies hypoglycemia , increased appetite, nausea, paresthesia of the feet, polydipsia, polyuria, visual disturbances and vomiting.  She reports fasting 170, admits needs to do better with diet and getting back to exercise.  Last A1C in the office was:  Lab Results  Component Value Date   HGBA1C 8.0 (H) 01/09/2021   She has CKD I/II associated with T2DM monitored at this office; pt declined ACEi/ARB, normal BPs. Last GFR:   Lab Results  Component Value Date   GFRNONAA 81 01/09/2021   GFRNONAA 94 08/29/2020   GFRNONAA 90 06/27/2020   Patient admits hasn't been taking Vitamin D supplement.  Lab Results  Component Value Date   VD25OH 12 (L) 08/29/2020     Admits hasn't been supplementing, plans to start Lab Results  Component Value Date   VITAMINB12 312 08/29/2020     Current Medications:  Current Outpatient Medications on File Prior to Visit  Medication Sig   aspirin EC 81 MG tablet Take 81 mg by mouth daily.   glipiZIDE (GLUCOTROL) 5 MG tablet Take 5 mg with breakfast and lunch and 5-10 mg with dinner.   glucose blood (ONETOUCH VERIO) test strip Use 2x a day   [name] 25 MG TABS tablet TAKE 1 TABLET ONCE DAILY.   meclizine (ANTIVERT) 25 MG tablet 1/2-1 pill up to 3 times daily for motion sickness/dizziness   montelukast (SINGULAIR) 10 MG tablet TAKE ONE TABLET AT BEDTIME.   OneTouch Delica Lancets 33G MISC Check 2x a day   OVER THE COUNTER MEDICATION Takes an OTC Allertec   rosuvastatin (CRESTOR) 40 MG tablet Take 1 tablet Daily for Cholesterol   [name] 3 MG/0.5ML SOPN INJECT  3MG  INTO THE SKIN ONCE A WEEK AS DIRECTED.   Cholecalciferol (VITAMIN D3) 2000 units TABS Take 4,000 Units by mouth. (Patient not taking: Reported on 04/17/2021)   Cyanocobalamin (B-12) 1000 MCG SUBL Place 1 tablet under the tongue daily. (Patient not taking: Reported on 04/17/2021)   No current facility-administered medications on file prior to visit.     Allergies:  Allergies  Allergen Reactions   Levaquin [Levofloxacin] Hives and Other (See Comments)    Tingling sensation to mouth, near LOC, blurred vision,     Iodine     Unknown reaction    Levofloxacin In D5w Hives    Hives   Metformin And Related Diarrhea   Shellfish Allergy     Unknown reaction    Vitamin D Analogs Diarrhea    High dose Vitamin D caused diarrhea   Amoxicillin Rash   Eggs Or Egg-Derived Products     Low, by skin test only, local itching when injected    Kombiglyze [Saxagliptin-Metformin Er] Other (See Comments)    Bloating   Latex Itching and Rash   Penicillins Rash     Medical History:  Past Medical History:  Diagnosis Date   Anxiety  Diabetes mellitus without complication (HCC)    Hx of colonic polyps    Fhx Colon cancer   Hyperlipidemia    Hypocholesteremia    Tobacco abuse    Family history- Reviewed and unchanged Social history- Reviewed and unchanged   Review of Systems:  Review of Systems  Constitutional:  Negative for malaise/fatigue and weight loss.  HENT:  Negative for hearing loss, sore throat and tinnitus.   Eyes:  Negative for blurred vision and double vision.  Respiratory:  Negative for cough, sputum production, shortness of breath and wheezing.   Cardiovascular:  Negative for chest pain, palpitations, orthopnea, claudication and leg swelling.  Gastrointestinal:  Negative for abdominal pain, blood in stool, constipation, diarrhea, heartburn, melena, nausea and vomiting.  Genitourinary: Negative.   Musculoskeletal:  Positive for back pain (lumbar/sacral) and myalgias (hips and  thighs, tender without weakness). Negative for falls, joint pain and neck pain.  Skin:  Negative for rash.  Neurological:  Negative for dizziness, tingling, sensory change, focal weakness, weakness and headaches.  Endo/Heme/Allergies:  Negative for polydipsia.  Psychiatric/Behavioral:  Negative for depression and substance abuse. The patient is not nervous/anxious and does not have insomnia.   All other systems reviewed and are negative.  Physical Exam: BP 122/70   Pulse 91   Temp (!) 97.3 F (36.3 C)   Wt 176 lb (79.8 kg)   SpO2 96%   BMI 31.43 kg/m  Wt Readings from Last 3 Encounters:  04/17/21 176 lb (79.8 kg)  01/09/21 176 lb (79.8 kg)  08/29/20 176 lb 3.2 oz (79.9 kg)   General Appearance: Well nourished, in no apparent distress. Eyes: PERRLA, EOMs, conjunctiva no swelling or erythema Sinuses: No Frontal/maxillary tenderness ENT/Mouth: Ext aud canals clear, TMs without erythema, bulging. No erythema, swelling, or exudate on post pharynx.  Tonsils not swollen or erythematous. Hearing normal.  Neck: Supple, thyroid normal.  Respiratory: Respiratory effort normal, BS equal bilaterally without rales, rhonchi, wheezing or stridor.  Cardio: RRR with no MRGs. Brisk peripheral pulses without edema.  Abdomen: Soft, + BS.  Non tender, no guarding, rebound, hernias, masses. Lymphatics: Non tender without lymphadenopathy.  Musculoskeletal: Full active ROM intact, slow steady gait. Full ROM lumbar, some SI tenderness bil, tenderness throughout gluteal and lateral thigh muscles bilaterally without distinct points of tenderness, no trochanteric tenderness, strength 5/5 throughout. Normal gait.  Skin: Warm, dry without rashes, lesions, ecchymosis.  Neuro: Cranial nerves intact. No cerebellar symptoms. Sensation intact Psych: Awake and oriented X 3, normal affect, Insight and Judgment appropriate.    Dan Maker, NP 11:22 AM [facility]

## 2021-04-17 ENCOUNTER — Ambulatory Visit: Payer: 59 | Admitting: Adult Health

## 2021-04-17 ENCOUNTER — Other Ambulatory Visit: Payer: Self-pay

## 2021-04-17 ENCOUNTER — Encounter: Payer: Self-pay | Admitting: Adult Health

## 2021-04-17 VITALS — BP 122/70 | HR 91 | Temp 97.3°F | Wt 176.0 lb

## 2021-04-17 DIAGNOSIS — E785 Hyperlipidemia, unspecified: Secondary | ICD-10-CM

## 2021-04-17 DIAGNOSIS — F411 Generalized anxiety disorder: Secondary | ICD-10-CM | POA: Diagnosis not present

## 2021-04-17 DIAGNOSIS — N182 Chronic kidney disease, stage 2 (mild): Secondary | ICD-10-CM

## 2021-04-17 DIAGNOSIS — E559 Vitamin D deficiency, unspecified: Secondary | ICD-10-CM

## 2021-04-17 DIAGNOSIS — M25552 Pain in left hip: Secondary | ICD-10-CM

## 2021-04-17 DIAGNOSIS — M25551 Pain in right hip: Secondary | ICD-10-CM

## 2021-04-17 DIAGNOSIS — E1122 Type 2 diabetes mellitus with diabetic chronic kidney disease: Secondary | ICD-10-CM | POA: Diagnosis not present

## 2021-04-17 DIAGNOSIS — E669 Obesity, unspecified: Secondary | ICD-10-CM

## 2021-04-17 DIAGNOSIS — Z79899 Other long term (current) drug therapy: Secondary | ICD-10-CM

## 2021-04-17 DIAGNOSIS — E1169 Type 2 diabetes mellitus with other specified complication: Secondary | ICD-10-CM

## 2021-04-17 DIAGNOSIS — M791 Myalgia, unspecified site: Secondary | ICD-10-CM

## 2021-04-17 MED ORDER — MELOXICAM 15 MG PO TABS: ORAL_TABLET | ORAL | 1 refills | Status: DC

## 2021-04-17 NOTE — Patient Instructions (Signed)

## 2021-04-18 LAB — HEMOGLOBIN A1C
Hgb A1c MFr Bld: 8.5 % of total Hgb — ABNORMAL HIGH (ref <5.7)
Mean Plasma Glucose: 197 mg/dL
eAG (mmol/L): 10.9 mmol/L

## 2021-04-18 LAB — LIPID PANEL
Cholesterol: 134 mg/dL (ref <200)
HDL: 39 mg/dL — ABNORMAL LOW (ref >=50)
LDL Cholesterol (Calc): 67 mg/dL (calc)
Non-HDL Cholesterol (Calc): 95 mg/dL (calc) (ref <130)
Total CHOL/HDL Ratio: 3.4 (calc) (ref <5.0)
Triglycerides: 223 mg/dL — ABNORMAL HIGH (ref <150)

## 2021-04-18 LAB — CBC WITH DIFFERENTIAL/PLATELET
Absolute Monocytes: 590 cells/uL (ref 200–950)
Basophils Absolute: 58 cells/uL (ref 0–200)
Basophils Relative: 0.8 %
Eosinophils Absolute: 158 cells/uL (ref 15–500)
Eosinophils Relative: 2.2 %
HCT: 48.5 % — ABNORMAL HIGH (ref 35.0–45.0)
Hemoglobin: 16.3 g/dL — ABNORMAL HIGH (ref 11.7–15.5)
Lymphs Abs: 1714 cells/uL (ref 850–3900)
MCH: 30.9 pg (ref 27.0–33.0)
MCHC: 33.6 g/dL (ref 32.0–36.0)
MCV: 91.9 fL (ref 80.0–100.0)
MPV: 10.7 fL (ref 7.5–12.5)
Monocytes Relative: 8.2 %
Neutro Abs: 4680 cells/uL (ref 1500–7800)
Neutrophils Relative %: 65 %
Platelets: 231 10*3/uL (ref 140–400)
RBC: 5.28 10*6/uL — ABNORMAL HIGH (ref 3.80–5.10)
RDW: 12.2 % (ref 11.0–15.0)
Total Lymphocyte: 23.8 %
WBC: 7.2 10*3/uL (ref 3.8–10.8)

## 2021-04-18 LAB — COMPLETE METABOLIC PANEL WITH GFR
AG Ratio: 2 (calc) (ref 1.0–2.5)
ALT: 20 U/L (ref 6–29)
AST: 17 U/L (ref 10–35)
Albumin: 5 g/dL (ref 3.6–5.1)
Alkaline phosphatase (APISO): 109 U/L (ref 37–153)
BUN: 15 mg/dL (ref 7–25)
CO2: 25 mmol/L (ref 20–32)
Calcium: 9.8 mg/dL (ref 8.6–10.4)
Chloride: 106 mmol/L (ref 98–110)
Creat: 0.71 mg/dL (ref 0.50–0.99)
GFR, Est African American: 106 mL/min/{1.73_m2} (ref >=60)
GFR, Est Non African American: 91 mL/min/{1.73_m2} (ref >=60)
Globulin: 2.5 g/dL (calc) (ref 1.9–3.7)
Glucose, Bld: 198 mg/dL — ABNORMAL HIGH (ref 65–99)
Potassium: 4.9 mmol/L (ref 3.5–5.3)
Sodium: 142 mmol/L (ref 135–146)
Total Bilirubin: 0.7 mg/dL (ref 0.2–1.2)
Total Protein: 7.5 g/dL (ref 6.1–8.1)

## 2021-04-18 LAB — MAGNESIUM: Magnesium: 2.4 mg/dL (ref 1.5–2.5)

## 2021-04-18 LAB — CK: Total CK: 55 U/L (ref 29–143)

## 2021-04-18 LAB — TSH: TSH: 1.03 mIU/L (ref 0.40–4.50)

## 2021-04-22 ENCOUNTER — Other Ambulatory Visit: Payer: Self-pay

## 2021-04-22 ENCOUNTER — Ambulatory Visit (INDEPENDENT_AMBULATORY_CARE_PROVIDER_SITE_OTHER): Payer: 59

## 2021-04-22 VITALS — BP 120/74 | HR 77 | Temp 96.4°F | Wt 176.0 lb

## 2021-04-22 DIAGNOSIS — R319 Hematuria, unspecified: Secondary | ICD-10-CM | POA: Diagnosis not present

## 2021-04-22 DIAGNOSIS — N390 Urinary tract infection, site not specified: Secondary | ICD-10-CM | POA: Diagnosis not present

## 2021-04-22 NOTE — Progress Notes (Signed)
Patient presents to the office for nurse visit complaining of abdominal pain urine frequency, dysuria with hematuria,  x 1 day. Not currently taking anything for the discomfort. Vitals taken and recorded.

## 2021-04-23 ENCOUNTER — Other Ambulatory Visit: Payer: Self-pay | Admitting: Adult Health

## 2021-04-23 DIAGNOSIS — R319 Hematuria, unspecified: Secondary | ICD-10-CM

## 2021-04-23 DIAGNOSIS — N390 Urinary tract infection, site not specified: Secondary | ICD-10-CM

## 2021-04-23 MED ORDER — SULFAMETHOXAZOLE-TRIMETHOPRIM 800-160 MG PO TABS: 1.0000 | ORAL_TABLET | Freq: Two times a day (BID) | ORAL | 0 refills | Status: DC

## 2021-04-24 ENCOUNTER — Other Ambulatory Visit: Payer: Self-pay | Admitting: Adult Health

## 2021-04-24 LAB — URINALYSIS, ROUTINE W REFLEX MICROSCOPIC
Bilirubin Urine: NEGATIVE
Ketones, ur: NEGATIVE
Nitrite: NEGATIVE
Specific Gravity, Urine: 1.032 (ref 1.001–1.035)
Squamous Epithelial / HPF: NONE SEEN /HPF (ref <=5)
WBC, UA: >=60 /HPF — AB (ref 0–5)
pH: 5.5 (ref 5.0–8.0)

## 2021-04-24 LAB — URINE CULTURE
MICRO NUMBER:: 12104297
SPECIMEN QUALITY:: ADEQUATE

## 2021-04-24 LAB — MICROSCOPIC MESSAGE

## 2021-04-24 MED ORDER — NITROFURANTOIN MONOHYD MACRO 100 MG PO CAPS: 100.0000 mg | ORAL_CAPSULE | Freq: Two times a day (BID) | ORAL | 0 refills | Status: DC

## 2021-05-14 ENCOUNTER — Other Ambulatory Visit: Payer: Self-pay | Admitting: Adult Health

## 2021-05-14 DIAGNOSIS — E1169 Type 2 diabetes mellitus with other specified complication: Secondary | ICD-10-CM

## 2021-05-29 ENCOUNTER — Ambulatory Visit: Payer: 59 | Admitting: Adult Health

## 2021-07-05 ENCOUNTER — Other Ambulatory Visit: Payer: Self-pay

## 2021-07-05 ENCOUNTER — Telehealth: Payer: Self-pay | Admitting: Internal Medicine

## 2021-07-05 ENCOUNTER — Ambulatory Visit: Payer: 59 | Admitting: Internal Medicine

## 2021-07-05 VITALS — BP 132/78 | HR 72 | Temp 97.9°F | Resp 16 | Ht 62.75 in | Wt 176.5 lb

## 2021-07-05 DIAGNOSIS — J22 Unspecified acute lower respiratory infection: Secondary | ICD-10-CM

## 2021-07-05 DIAGNOSIS — K5792 Diverticulitis of intestine, part unspecified, without perforation or abscess without bleeding: Secondary | ICD-10-CM

## 2021-07-05 MED ORDER — DOXYCYCLINE HYCLATE 100 MG PO TABS: 100.0000 mg | ORAL_TABLET | Freq: Two times a day (BID) | ORAL | 0 refills | Status: DC

## 2021-07-05 MED ORDER — METHYLPREDNISOLONE 4 MG PO TBPK: ORAL_TABLET | ORAL | 0 refills | Status: DC

## 2021-07-05 MED ORDER — FLUCONAZOLE 150 MG PO TABS: 150.0000 mg | ORAL_TABLET | Freq: Once | ORAL | 0 refills | Status: AC

## 2021-07-05 MED ORDER — ALBUTEROL SULFATE HFA 108 (90 BASE) MCG/ACT IN AERS: 2.0000 | INHALATION_SPRAY | Freq: Four times a day (QID) | RESPIRATORY_TRACT | 0 refills | Status: DC | PRN

## 2021-07-05 NOTE — Telephone Encounter (Signed)
Candice [name] [phone]  [name] came to work this morning with chest tightness, wheezing, coughing, no fever, feels fine.  Scheduled for office visit

## 2021-07-05 NOTE — Progress Notes (Signed)
   Subjective:    [name] ID: Candice [name], female    DOB: 02/03/1956, 65 y.o.   MRN: [id number]  HPI 65 year old onset 2 days ago of cough, chest tightness and wheezing.  Has not taken a home COVID test.  No one else in her home is ill.  No fever or shaking chills.  No dysgeusia.  Has malaise and fatigue.   She does have a history of reactive airways disease.   Review of Systems cough is non-productive. Hx DM Type 2. Last Hgb AIC 8.5% Mild difficulty with respiration.  No frank wheezing.     Objective:   Physical Exam Blood pressure 132/78 pulse 72 respiratory rate 16 temperature 97.9 pulse oximetry 97% weight 176 pounds 8 ounces BMI 31.52  Skin: Warm and dry.  Nodes none.  TMs clear.  Pharynx slightly injected.  Neck supple.  Chest clear.       Assessment & Plan:  Acute upper respiratory infection  History of reactive airways  Diabetes mellitus  Plan: [name] will take Medrol Dosepak 4 mg in tapering course as directed starting with 6 tablets day 1 and decreasing by 1 tablet daily.  She will take doxycycline 100 mg twice daily for 10 days.  Rest and drink fluids.  Call if symptoms worsen.  Monitor pulse oximetry at home.  Monitor Accu-Cheks.  Refill albuterol inhaler.  May take Diflucan if develops Candida vaginitis while on antibiotic therapy.

## 2021-07-05 NOTE — Telephone Encounter (Signed)
scheduled

## 2021-07-06 NOTE — Patient Instructions (Addendum)
Take Medrol Dosepak 4 mg in tapering course starting with 6 tablets day 1 and decreasing by 1 tab daily.  Take doxycycline 100 mg twice daily for 10 days.  Rest and drink fluids.  Monitor Accu-Cheks and pulse oximetry.  Call if symptoms worsen.  Refill albuterol inhaler.  May take Diflucan if develops Candida vaginitis while antibiotic therapy.

## 2021-07-10 ENCOUNTER — Other Ambulatory Visit: Payer: Self-pay | Admitting: Adult Health

## 2021-08-18 ENCOUNTER — Other Ambulatory Visit: Payer: Self-pay | Admitting: Internal Medicine

## 2021-08-29 ENCOUNTER — Encounter: Payer: 59 | Admitting: Adult Health

## 2021-10-22 NOTE — Progress Notes (Signed)
Complete Physical  Assessment and Plan:  Candice [name] was seen today for annual exam.  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 Cruzita Lederer;  Education: Reviewed ABCs of diabetes management (respective goals in parentheses):  A1C (<7), blood pressure (<130/80), and cholesterol (LDL <70) Eye Exam yearly and Dental Exam every 6 months - reminded patient to schedule, diabetes eye exam report reqested Dietary recommendations Physical Activity recommendations -     HM DIABETES FOOT EXAM -     A1C  Hyperlipidemia associated with type 2 diabetes mellitus ([place]) Continue medications: rosuvastatin 40 mg daily  Has been out of medication; reordered LDL goal <70 Continue low cholesterol diet and exercise.  Check lipid panel.  -     Lipid panel -     TSH  Vitamin D deficiency Try drops for supplement discussed due to historical intolerance with caps  At minimum need 30+  -     VITAMIN D 25 Hydroxy (Vit-D Deficiency, Fractures)  Medication management -     CBC with Differential/Platelet -     COMPLETE METABOLIC PANEL WITH GFR -     Magnesium -     Urinalysis, Routine w reflex microscopic  Obesity (BMI 30.0-34.9) Long discussion about weight loss, diet, and exercise Recommended  diet heavy in fruits and veggies and low in animal meats, cheeses, and dairy products, appropriate calorie intake Discussed appropriate weight for height Follow up at next visit  Polycythemia Had recent normal iron/ferritin and smear review -     CBC with Differential/Platelet -     EPO  Anxiety Well controlled with current medication; she did not tolerate taper Stress management techniques discussed, increase water, good sleep hygiene discussed, increase exercise, and increase veggies.   Labile hypertension Typically well controlled; she declines medication, ACE/ARB despite discussion of benefits with T2DM and renal protection Monitor blood pressure at home; call if consistently over 130/80 Continue DASH diet.   Reminder to go to the ER if any CP, SOB, nausea, dizziness, severe HA, changes vision/speech, left arm numbness and tingling and jaw pain. -     CBC with Differential/Platelet -     COMPLETE METABOLIC PANEL WITH GFR -     Magnesium -     TSH -     Microalbumin / creatinine urine ratio -     Urinalysis, Routine w reflex microscopic -     EKG 12-Lead  B12 def - check today and stat SL supplement per recommendation  Family history of abdominal aortic aneurysm (AAA) Monitor; control BP, routine screening, had WNL AAA [place] on 08/2019  Former smoker (20 pack year hx, quit 2019) -lung cancer screening with low dose CT discussed as recommended by guidelines based on age, number of pack year history.  Discussed risks of screening including but not  limited to false positives on xray, further testing or consultation with specialist, and possible false negative CT as well. Understanding expressed and wishes to proceed with CT testing. Order placed.   Need for immunization against influenza  -     FLU VACCINE MDCK QUAD W/Preservative    Orders Placed This Encounter  Procedures   CT CHEST LUNG CA SCREEN LOW DOSE W/O CM   MM Digital Screening   CBC with Differential/Platelet    COMPLETE METABOLIC PANEL WITH GFR   Magnesium   Lipid panel   TSH   Hemoglobin A1c   VITAMIN D 25 Hydroxy (Vit-D Deficiency, Fractures)   Vitamin B12   Microalbumin / creatinine urine ratio   Urinalysis, Routine w reflex microscopic   EKG 12-Lead   HM DIABETES FOOT EXAM     Discussed med's effects and SE's. Screening labs and tests as requested with regular follow-up as recommended. Over 40 minutes of exam, counseling, chart review, and complex, high level critical decision making was performed this visit.   Future Appointments  Date Time Provider [place]  10/23/2022 10:00 AM Liane Comber, NP [facility] None     HPI  66 y.o. female  presents for a complete physical and follow up for has Former smoker (30 pack year, quit 2019); Diabetes mellitus ([place]); Hyperlipidemia associated with type 2 diabetes mellitus ([place]); Generalized anxiety disorder; Vitamin D deficiency; Medication management; Obesity (BMI 30.0-34.9); CKD stage 2 due to type 2 diabetes mellitus ([place]); Polycythemia; and Vitamin B 12 deficiency on their problem list.   She is married, 1 daughter, 1 grandson and new granddaughter. 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 She keeps grandchildren in the afternoon.   She follows with GYN, PA Tod Persia at [facility], due for PAP/mammogram.   She is on OTC antihistamine and Singulair for seasonable allergies.   She is off of celexa, reports has been doing failry off of med, wants to star off for now.   She is a former smoker, 20 pack year hx, quit 2019, discussed low dose CT.   BMI is Body mass index is 30.93 kg/m., she has been working on diet and exercise, trying to walk more but admits has been limited. Has an exercise bike that she plans to start this year.  She admits needs to do better with dietary choices. She is making small goals.  She is trying to improve water intake, not sure how much.  Wt Readings from Last 3 Encounters:   10/23/21 174 lb 9.6 oz (79.2 kg)  07/05/21 176 lb 8 oz (80.1 kg)  04/22/21 176 lb (79.8 kg)   Her blood pressure has been controlled at home, today their BP is BP: 112/66 She does not workout. She denies chest pain, shortness of breath, dizziness.   She is on cholesterol medication (rosuvastatin 40 mg daily)  and denies myalgias. Her cholesterol is not at goal. The cholesterol last visit was:   Lab Results  Component Value Date   CHOL 134 04/17/2021   HDL 39 (L) 04/17/2021   LDLCALC 67 04/17/2021   TRIG 223 (H) 04/17/2021   CHOLHDL 3.4 04/17/2021   She has been working on diet and exercise for T2DM ([name] 25 mg, trulicity 3 mg weekly, glipizide 10 mg AM, 5 mg with lunch and 1 tab with dinner, hx of metformin intolerance), was following with Dr. Cruzita Lederer as well, last seen 11/2019, patient pref not to go back, she is on bASA, she is not on  ACE/ARB (low BPs, patient adamantly declines) and denies hypoglycemia , increased appetite, nausea, paresthesia of the feet, polydipsia and polyuria.  She does check fasting glucose, ranging 150-180s.  Last A1C in the office was:  Lab Results  Component Value Date   HGBA1C 8.5 (H) 04/17/2021   Last GFR: Lab Results  Component Value Date   GFRNONAA 91 04/17/2021   Patient is not on Vitamin D supplement, forgets to take, has been recommended 4000 IU.  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 No immature cells, adequate platelets, Erythrocytosis with unremarkable RBC morphology.  CBC Latest Ref Rng & Units 04/17/2021 01/09/2021 08/29/2020  WBC 3.8 - 10.8 Thousand/uL 7.2 7.1 6.3  Hemoglobin 11.7 - 15.5 g/dL 16.3(H) 16.4(H) 16.3(H)  Hematocrit 35.0 - 45.0 % 48.5(H) 50.2(H) 47.9(H)  Platelets 140 -  400 Thousand/uL 231 239 231   Lab Results  Component Value Date   IRON 126 06/27/2020   TIBC 352 08/15/2019   FERRITIN 179 06/27/2020   She admits hasn't been taking supplement, will start sublingual Lab Results  Component Value Date   VITAMINB12 312 08/29/2020      Current Medications:  Current Outpatient Medications on File Prior to Visit  Medication Sig Dispense Refill   aspirin EC 81 MG tablet Take 81 mg by mouth daily.     Cholecalciferol (VITAMIN D3) 2000 units TABS Take 4,000 Units by mouth.     glipiZIDE (GLUCOTROL) 5 MG tablet TAKE 2 TABLETS BEFORE BREAKFAST AND 1-2 TABLETS BEFORE MIDNIGHT. 360 tablet 3   glucose blood (ONETOUCH VERIO) test strip CHECK BLOOD SUGAR TWICE DAILY. 180 strip 3   [name] 25 MG TABS tablet TAKE 1 TABLET ONCE DAILY. 90 tablet 3   montelukast (SINGULAIR) 10 MG tablet TAKE ONE TABLET AT BEDTIME. 90 tablet 3   OneTouch Delica Lancets 99991111 MISC Check 2x a day 200 each 3   OVER THE COUNTER MEDICATION Takes an OTC Allertec     rosuvastatin (CRESTOR) 40 MG tablet TAKE 1 TABLET ONCE A DAY TO CONTROL CHOLESTEROL. 90 tablet 3   [name] 3 0000000 SOPN INJECT 3MG  INTO THE SKIN ONCE A WEEK AS DIRECTED. 6 mL 1   Cyanocobalamin (B-12) 1000 MCG SUBL Place 1 tablet under the tongue daily. (Patient not taking: Reported on 04/17/2021) 30 tablet 0   No current facility-administered medications on file prior to visit.   Allergies:  Allergies  Allergen Reactions   Levaquin [Levofloxacin] Hives and Other (See Comments)    Tingling sensation to mouth, near LOC, blurred vision,     Iodine     Unknown reaction    Levofloxacin In D5w Hives    Hives   Metformin And Related Diarrhea   Shellfish Allergy     Unknown reaction    Vitamin D Analogs Diarrhea    High dose Vitamin D caused diarrhea   Amoxicillin Rash   Eggs Or Egg-Derived Products     Low, by skin test only, local itching when injected    Kombiglyze [Saxagliptin-Metformin Er] Other (See Comments)     Bloating   Latex Itching and Rash   Penicillins Rash   Medical History:  She has Former smoker (30 pack year, quit 2019); Diabetes mellitus ([place]); Hyperlipidemia associated with type 2 diabetes mellitus ([place]); Generalized anxiety  disorder; Vitamin D deficiency; Medication management; Obesity (BMI 30.0-34.9); CKD stage 2 due to type 2 diabetes mellitus ([place]); Polycythemia; and Vitamin B 12 deficiency on their problem list. Health Maintenance:   Immunization History  Administered Date(s) Administered   Influenza Inj Mdck Quad With Preservative 08/15/2019, 08/29/2020   Tdap 02/14/2013    Tetanus: 2014 Pneumovax: ? 2014, reports has had, repeat age 6 Prevnar 40:  Flu vaccine: 2021 TODAY Shingrix: declines for now, check with insurance  Covid 19: did get J&J, will send info  LMP: No LMP recorded. Patient is postmenopausal. Pap: 07/2020, gets annually at GYN MGM: 08/01/2020 at GYN, overdue, will set up at breast center pending new office DEXA: n/a  Colonoscopy: 12/2010, due 2022, Dr. Earlean Shawl EGD: n/a  Last Dental Exam: Dr. Mina Marble at [facility], last 2018, overdue to schedule, reminded Last Eye Exam: Dr. Posey Pronto, [facility], last 09/2020, will call to schedule, report requested  Patient Care Team: Unk Pinto, MD as PCP - General (Internal Medicine) Earnstine Regal, PA-C as Physician Assistant (Obstetrics and Gynecology)  Surgical History:  She has a past surgical history that includes Cesarean section; Tonsillectomy; Appendectomy; and Cholecystectomy. Family History:  Herfamily history includes AAA (abdominal aortic aneurysm) in her mother; Brain cancer (age of onset: 49) in her maternal grandmother; Cancer in her sister; Diabetes in her sister; Heart attack in her maternal grandfather; Heart attack (age of onset: 54) in her brother; Heart disease in her sister; Hyperlipidemia in her mother. Social History:  She reports that she quit smoking about 4 years ago. Her smoking use  included cigarettes. She started smoking about 44 years ago. She has a 20.00 pack-year smoking history. She has never used smokeless tobacco. She reports that she does not drink alcohol and does not use drugs.  Review of Systems: Review of Systems  Constitutional:  Negative for malaise/fatigue and weight loss.  HENT:  Negative for hearing loss and tinnitus.   Eyes:  Negative for blurred vision and double vision.  Respiratory:  Negative for cough, sputum production, shortness of breath and wheezing.   Cardiovascular:  Negative for chest pain, palpitations, orthopnea, claudication, leg swelling and PND.  Gastrointestinal:  Negative for abdominal pain, blood in stool, constipation, diarrhea, heartburn, melena, nausea and vomiting.  Genitourinary: Negative.   Musculoskeletal:  Negative for falls, joint pain and myalgias.  Skin:  Negative for rash.  Neurological:  Negative for dizziness, tingling, sensory change, weakness and headaches.  Endo/Heme/Allergies:  Negative for polydipsia.  Psychiatric/Behavioral: Negative.  Negative for depression, memory loss, substance abuse and suicidal ideas. The patient is not nervous/anxious and does not have insomnia.   All other systems reviewed and are negative.  Physical Exam: Estimated body mass index is 30.93 kg/m as calculated from the following:   Height as of this encounter: 5\' 3"  (1.6 m).   Weight as of this encounter: 174 lb 9.6 oz (79.2 kg). BP 112/66    Pulse 84    Temp 97.7 F (36.5 C)    Ht 5\' 3"  (1.6 m)    Wt 174 lb 9.6 oz (79.2 kg)    SpO2 98%    BMI 30.93 kg/m  General Appearance: Well nourished, in no apparent distress.  Eyes: PERRLA, EOMs, conjunctiva no swelling or erythema, fundal exam deferred to ophth  Sinuses: No Frontal/maxillary tenderness  ENT/Mouth: Ext aud canals clear, normal light reflex with TMs without erythema, bulging. Good dentition. No erythema, swelling, or exudate on post pharynx. Tonsils not swollen or erythematous.  Hearing  normal.  Neck: Supple, thyroid normal. No bruits  Respiratory: Respiratory effort normal, BS equal bilaterally without rales, rhonchi, wheezing or stridor.  Cardio: RRR without murmurs, rubs or gallops. Brisk peripheral pulses without edema.  Chest: symmetric, with normal excursions and percussion.  Breasts: Defer to GYN, getting mammogram  Abdomen: Soft, nontender, no guarding, rebound, hernias, masses, or organomegaly.  Lymphatics: Non tender without lymphadenopathy.  Genitourinary: Defer to GYN Musculoskeletal: Full ROM all peripheral extremities,5/5 strength, and normal gait.  Skin: Warm, dry without rashes, lesions, ecchymosis. Neuro: Cranial nerves intact, reflexes equal bilaterally. Normal muscle tone, no cerebellar symptoms. Sensation intact.  Psych: Awake and oriented X 3, normal affect, Insight and Judgment appropriate.   EKG: WNL no ST changes.  AAA [place]: WNL 08/15/2019  Izora Ribas, NP 11:00 AM [facility]

## 2021-10-23 ENCOUNTER — Ambulatory Visit (INDEPENDENT_AMBULATORY_CARE_PROVIDER_SITE_OTHER): Payer: 59 | Admitting: Adult Health

## 2021-10-23 ENCOUNTER — Other Ambulatory Visit: Payer: Self-pay

## 2021-10-23 ENCOUNTER — Encounter: Payer: Self-pay | Admitting: Adult Health

## 2021-10-23 VITALS — BP 112/66 | HR 84 | Temp 97.7°F | Ht 63.0 in | Wt 174.6 lb

## 2021-10-23 DIAGNOSIS — Z1389 Encounter for screening for other disorder: Secondary | ICD-10-CM

## 2021-10-23 DIAGNOSIS — E785 Hyperlipidemia, unspecified: Secondary | ICD-10-CM

## 2021-10-23 DIAGNOSIS — E669 Obesity, unspecified: Secondary | ICD-10-CM

## 2021-10-23 DIAGNOSIS — Z87891 Personal history of nicotine dependence: Secondary | ICD-10-CM | POA: Diagnosis not present

## 2021-10-23 DIAGNOSIS — Z136 Encounter for screening for cardiovascular disorders: Secondary | ICD-10-CM

## 2021-10-23 DIAGNOSIS — I10 Essential (primary) hypertension: Secondary | ICD-10-CM

## 2021-10-23 DIAGNOSIS — E559 Vitamin D deficiency, unspecified: Secondary | ICD-10-CM

## 2021-10-23 DIAGNOSIS — Z0001 Encounter for general adult medical examination with abnormal findings: Secondary | ICD-10-CM

## 2021-10-23 DIAGNOSIS — Z0000 Encounter for general adult medical examination without abnormal findings: Secondary | ICD-10-CM

## 2021-10-23 DIAGNOSIS — Z1329 Encounter for screening for other suspected endocrine disorder: Secondary | ICD-10-CM

## 2021-10-23 DIAGNOSIS — D751 Secondary polycythemia: Secondary | ICD-10-CM

## 2021-10-23 DIAGNOSIS — E1169 Type 2 diabetes mellitus with other specified complication: Secondary | ICD-10-CM

## 2021-10-23 DIAGNOSIS — E119 Type 2 diabetes mellitus without complications: Secondary | ICD-10-CM

## 2021-10-23 DIAGNOSIS — Z79899 Other long term (current) drug therapy: Secondary | ICD-10-CM

## 2021-10-23 DIAGNOSIS — Z23 Encounter for immunization: Secondary | ICD-10-CM

## 2021-10-23 DIAGNOSIS — Z1231 Encounter for screening mammogram for malignant neoplasm of breast: Secondary | ICD-10-CM

## 2021-10-23 DIAGNOSIS — E538 Deficiency of other specified B group vitamins: Secondary | ICD-10-CM

## 2021-10-23 DIAGNOSIS — Z122 Encounter for screening for malignant neoplasm of respiratory organs: Secondary | ICD-10-CM

## 2021-10-23 DIAGNOSIS — F411 Generalized anxiety disorder: Secondary | ICD-10-CM

## 2021-10-23 DIAGNOSIS — N182 Chronic kidney disease, stage 2 (mild): Secondary | ICD-10-CM

## 2021-10-23 DIAGNOSIS — Z1211 Encounter for screening for malignant neoplasm of colon: Secondary | ICD-10-CM

## 2021-10-23 MED ORDER — MECLIZINE HCL 25 MG PO TABS: ORAL_TABLET | ORAL | 0 refills | Status: DC

## 2021-10-23 NOTE — Patient Instructions (Addendum)
Candice [name] , Thank you for taking time to come for your Annual Wellness Visit. I appreciate your ongoing commitment to your health goals. Please review the following plan we discussed and let me know if I can assist you in the future.   These are the goals we discussed:  Goals      Exercise 150 min/wk Moderate Activity     Fasting Blood Glucose <150     HEMOGLOBIN A1C < 7.0     LDL CALC < 70     Weight (lb) < 160 lb (72.6 kg)        This is a list of the screening recommended for you and due dates:  Health Maintenance  Topic Date Due   COVID-19 Vaccine (1) Never done   Pneumococcal Vaccination (1 - PCV) Never done   Zoster (Shingles) Vaccine (1 of 2) Never done   Eye exam for diabetics  10/11/2020   Colon Cancer Screening  12/18/2020   Flu Shot  05/13/2021   Urine Protein Check  08/29/2021   Hemoglobin A1C  10/18/2021   Mammogram  08/01/2022   Complete foot exam   10/23/2022   Tetanus Vaccine  02/15/2023   Pap Smear  08/02/2023   Hepatitis C Screening: USPSTF Recommendation to screen - Ages 18-79 yo.  Completed   HPV Vaccine  Aged Out   HIV Screening  Discontinued   J& J covid vaccine information - please send   Check with insurance/pharmacy if they cover shingrix vaccine    HOW TO SCHEDULE A MAMMOGRAM  [facility]  7 a.m.-6:30 p.m., Monday 7 a.m.-5 p.m., Tuesday-Friday Schedule an appointment by calling [phone].     Know what a healthy weight is for you (roughly BMI <25) and aim to maintain this  Aim for 7+ servings of fruits and vegetables daily  65-80+ fluid ounces of water or unsweet tea for healthy kidneys  Limit to max 1 drink of alcohol per day; avoid smoking/tobacco  Limit animal fats in diet for cholesterol and heart health - choose grass fed whenever available  Avoid highly processed foods, and foods high in saturated/trans fats  Aim for low stress - take time to unwind and care for your mental  health  Aim for 150 min of moderate intensity exercise weekly for heart health, and weights twice weekly for bone health  Aim for 7-9 hours of sleep daily     High-Fiber Eating Plan Fiber, also called dietary fiber, is a type of carbohydrate. It is found foods such as fruits, vegetables, whole grains, and beans. A high-fiber diet can have many health benefits. Your health care provider may recommend a high-fiber diet to help: Prevent constipation. Fiber can make your bowel movements more regular. Lower your cholesterol. Relieve the following conditions: Inflammation of veins in the anus (hemorrhoids). Inflammation of specific areas of the digestive tract (uncomplicated diverticulosis). A problem of the large intestine, also called the colon, that sometimes causes pain and diarrhea (irritable bowel syndrome, or IBS). Prevent overeating as part of a weight-loss plan. Prevent heart disease, type 2 diabetes, and certain cancers. What are tips for following this plan? Reading food labels  Check the nutrition facts label on food products for the amount of dietary fiber. Choose foods that have 5 grams of fiber or more per serving. The goals for recommended daily fiber intake include: Men (age 52 or younger): 34-38 g. Men (over age 74): 28-34 g. Women (age 49 or younger): 25-28  g. Women (over age 45): 22-25 g. Your daily fiber goal is _____________ g. Shopping Choose whole fruits and vegetables instead of processed forms, such as apple juice or applesauce. Choose a wide variety of high-fiber foods such as avocados, lentils, oats, and kidney beans. Read the nutrition facts label of the foods you choose. Be aware of foods with added fiber. These foods often have high sugar and sodium amounts per serving. Cooking Use whole-grain flour for baking and cooking. Cook with brown rice instead of white rice. Meal planning Start the day with a breakfast that is high in fiber, such as a cereal that  contains 5 g of fiber or more per serving. Eat breads and cereals that are made with whole-grain flour instead of refined flour or white flour. Eat brown rice, bulgur wheat, or millet instead of white rice. Use beans in place of meat in soups, salads, and pasta dishes. Be sure that half of the grains you eat each day are whole grains. General information You can get the recommended daily intake of dietary fiber by: Eating a variety of fruits, vegetables, grains, nuts, and beans. Taking a fiber supplement if you are not able to take in enough fiber in your diet. It is better to get fiber through food than from a supplement. Gradually increase how much fiber you consume. If you increase your intake of dietary fiber too quickly, you may have bloating, cramping, or gas. Drink plenty of water to help you digest fiber. Choose high-fiber snacks, such as berries, raw vegetables, nuts, and popcorn. What foods should I eat? Fruits Berries. Pears. Apples. Oranges. Avocado. Prunes and raisins. Dried figs. Vegetables Sweet potatoes. Spinach. Kale. Artichokes. Cabbage. Broccoli. Cauliflower. Green peas. Carrots. Squash. Grains Whole-grain breads. Multigrain cereal. Oats and oatmeal. Brown rice. Barley. Bulgur wheat. Millet. Quinoa. Bran muffins. Popcorn. Rye wafer crackers. Meats and other proteins Navy beans, kidney beans, and pinto beans. Soybeans. Split peas. Lentils. Nuts and seeds. Dairy Fiber-fortified yogurt. Beverages Fiber-fortified soy milk. Fiber-fortified orange juice. Other foods Fiber bars. The items listed above may not be a complete list of recommended foods and beverages. Contact a dietitian for more information. What foods should I avoid? Fruits Fruit juice. Cooked, strained fruit. Vegetables Fried potatoes. Canned vegetables. Well-cooked vegetables. Grains White bread. Pasta made with refined flour. White rice. Meats and other proteins Fatty cuts of meat. Fried chicken or  fried fish. Dairy Milk. Yogurt. Cream cheese. Sour cream. Fats and oils Butters. Beverages Soft drinks. Other foods Cakes and pastries. The items listed above may not be a complete list of foods and beverages to avoid. Talk with your dietitian about what choices are best for you. Summary Fiber is a type of carbohydrate. It is found in foods such as fruits, vegetables, whole grains, and beans. A high-fiber diet has many benefits. It can help to prevent constipation, lower blood cholesterol, aid weight loss, and reduce your risk of heart disease, diabetes, and certain cancers. Increase your intake of fiber gradually. Increasing fiber too quickly may cause cramping, bloating, and gas. Drink plenty of water while you increase the amount of fiber you consume. The best sources of fiber include whole fruits and vegetables, whole grains, nuts, seeds, and beans. This information is not intended to replace advice given to you by your health care provider. Make sure you discuss any questions you have with your health care provider. Document Revised: 02/02/2020 Document Reviewed: 02/02/2020 Elsevier Patient Education  2022 [employer].

## 2021-10-24 ENCOUNTER — Other Ambulatory Visit: Payer: Self-pay | Admitting: Adult Health

## 2021-10-24 LAB — VITAMIN B12: Vitamin B-12: 328 pg/mL (ref 200–1100)

## 2021-10-24 LAB — CBC WITH DIFFERENTIAL/PLATELET
Absolute Monocytes: 448 cells/uL (ref 200–950)
Basophils Absolute: 38 cells/uL (ref 0–200)
Basophils Relative: 0.6 %
Eosinophils Absolute: 122 cells/uL (ref 15–500)
Eosinophils Relative: 1.9 %
HCT: 48.2 % — ABNORMAL HIGH (ref 35.0–45.0)
Hemoglobin: 16.4 g/dL — ABNORMAL HIGH (ref 11.7–15.5)
Lymphs Abs: 2003 cells/uL (ref 850–3900)
MCH: 31.5 pg (ref 27.0–33.0)
MCHC: 34 g/dL (ref 32.0–36.0)
MCV: 92.5 fL (ref 80.0–100.0)
MPV: 10.6 fL (ref 7.5–12.5)
Monocytes Relative: 7 %
Neutro Abs: 3789 cells/uL (ref 1500–7800)
Neutrophils Relative %: 59.2 %
Platelets: 234 10*3/uL (ref 140–400)
RBC: 5.21 10*6/uL — ABNORMAL HIGH (ref 3.80–5.10)
RDW: 12.1 % (ref 11.0–15.0)
Total Lymphocyte: 31.3 %
WBC: 6.4 10*3/uL (ref 3.8–10.8)

## 2021-10-24 LAB — COMPLETE METABOLIC PANEL WITH GFR
AG Ratio: 1.8 (calc) (ref 1.0–2.5)
ALT: 17 U/L (ref 6–29)
AST: 17 U/L (ref 10–35)
Albumin: 4.6 g/dL (ref 3.6–5.1)
Alkaline phosphatase (APISO): 99 U/L (ref 37–153)
BUN: 12 mg/dL (ref 7–25)
CO2: 27 mmol/L (ref 20–32)
Calcium: 10 mg/dL (ref 8.6–10.4)
Chloride: 104 mmol/L (ref 98–110)
Creat: 0.71 mg/dL (ref 0.50–1.05)
Globulin: 2.5 g/dL (calc) (ref 1.9–3.7)
Glucose, Bld: 155 mg/dL — ABNORMAL HIGH (ref 65–99)
Potassium: 4.6 mmol/L (ref 3.5–5.3)
Sodium: 140 mmol/L (ref 135–146)
Total Bilirubin: 0.7 mg/dL (ref 0.2–1.2)
Total Protein: 7.1 g/dL (ref 6.1–8.1)
eGFR: 95 mL/min/{1.73_m2} (ref >=60)

## 2021-10-24 LAB — LIPID PANEL
Cholesterol: 121 mg/dL (ref <200)
HDL: 42 mg/dL — ABNORMAL LOW (ref >=50)
LDL Cholesterol (Calc): 53 mg/dL (calc)
Non-HDL Cholesterol (Calc): 79 mg/dL (calc) (ref <130)
Total CHOL/HDL Ratio: 2.9 (calc) (ref <5.0)
Triglycerides: 184 mg/dL — ABNORMAL HIGH (ref <150)

## 2021-10-24 LAB — HEMOGLOBIN A1C
Hgb A1c MFr Bld: 8.2 % of total Hgb — ABNORMAL HIGH (ref <5.7)
Mean Plasma Glucose: 189 mg/dL
eAG (mmol/L): 10.4 mmol/L

## 2021-10-24 LAB — VITAMIN D 25 HYDROXY (VIT D DEFICIENCY, FRACTURES): Vit D, 25-Hydroxy: 26 ng/mL — ABNORMAL LOW (ref 30–100)

## 2021-10-24 LAB — TSH: TSH: 1.09 mIU/L (ref 0.40–4.50)

## 2021-10-24 LAB — MAGNESIUM: Magnesium: 2.3 mg/dL (ref 1.5–2.5)

## 2021-10-24 MED ORDER — VITAMIN D3 50 MCG (2000 UT) PO TABS: 5000.0000 [IU] | ORAL_TABLET | Freq: Every day | ORAL | Status: AC

## 2021-10-29 ENCOUNTER — Other Ambulatory Visit: Payer: Self-pay

## 2021-10-29 MED ORDER — TRIAMCINOLONE ACETONIDE 0.1 % EX CREA: 1.0000 "application " | TOPICAL_CREAM | Freq: Three times a day (TID) | CUTANEOUS | 0 refills | Status: DC

## 2021-10-29 NOTE — Progress Notes (Signed)
[place]

## 2021-12-06 ENCOUNTER — Other Ambulatory Visit: Payer: Self-pay | Admitting: Adult Health

## 2021-12-06 DIAGNOSIS — E1169 Type 2 diabetes mellitus with other specified complication: Secondary | ICD-10-CM

## 2021-12-09 ENCOUNTER — Other Ambulatory Visit: Payer: Self-pay

## 2021-12-09 ENCOUNTER — Ambulatory Visit: Payer: 59 | Admitting: Internal Medicine

## 2021-12-09 ENCOUNTER — Encounter: Payer: Self-pay | Admitting: Internal Medicine

## 2021-12-09 VITALS — BP 108/68 | HR 81 | Temp 99.0°F | Ht 63.0 in | Wt 174.0 lb

## 2021-12-09 DIAGNOSIS — K5792 Diverticulitis of intestine, part unspecified, without perforation or abscess without bleeding: Secondary | ICD-10-CM

## 2021-12-09 DIAGNOSIS — R319 Hematuria, unspecified: Secondary | ICD-10-CM | POA: Diagnosis not present

## 2021-12-09 DIAGNOSIS — N390 Urinary tract infection, site not specified: Secondary | ICD-10-CM

## 2021-12-09 DIAGNOSIS — R82998 Other abnormal findings in urine: Secondary | ICD-10-CM

## 2021-12-09 DIAGNOSIS — R300 Dysuria: Secondary | ICD-10-CM | POA: Diagnosis not present

## 2021-12-09 LAB — POCT URINALYSIS DIPSTICK
Glucose, UA: POSITIVE — AB
Ketones, UA: NEGATIVE
Nitrite, UA: NEGATIVE
Protein, UA: POSITIVE — AB
Spec Grav, UA: 1.02 (ref 1.010–1.025)
Urobilinogen, UA: 0.2 E.U./dL
pH, UA: 5 (ref 5.0–8.0)

## 2021-12-09 MED ORDER — DOXYCYCLINE HYCLATE 100 MG PO TABS: 100.0000 mg | ORAL_TABLET | Freq: Two times a day (BID) | ORAL | 0 refills | Status: DC

## 2021-12-09 NOTE — Patient Instructions (Addendum)
Rest and drink fluids.  Start doxycycline 100 mg twice daily for 7 days.  Urine culture sent.  Bilirubin noted in urine today.  Consider checking CBC and liver functions.  Needs follow-up in 10 days or sooner if worse.

## 2021-12-09 NOTE — Progress Notes (Signed)
Done

## 2021-12-09 NOTE — Progress Notes (Signed)
° °  Subjective:    Patient ID: Candice [name], female    DOB: [birth year], 66 y.o.   MRN: [id number]  HPI 67 year old Female seen for dysuria and urinary frequency. Symptoms started Saturday. No fever or chills. No nausea or vomiting. Had UTI July 2022 treated with Bactrim DS at her Primary Care office.  No fever, chills, nausea or vomiting.  She has Type 2 Diabetes Mellitus, HTN, Hyperlipidemia,Vitamin D deficiency, B12 deficiency.    Review of Systems finished Amoxicillin for infected tooth fracture on Friday     Objective:   Physical Exam  BP 108/68 pulse 81, T 99 degrees pulse ox 96%  weight 174 pounds. BMI 30.82    No CVA tenderness.Dipstick urine specimen positive for glucose and protein, moderate bilirubin but patient not jaundiced, large occult blood and trace LE. SG 1.020. Color is dark.     Assessment & Plan:  Dysuria- symptoms c/w acute UTI Type 2 diabetes mellitus Bilirubin in urine on dipstick but pt not jaundiced- meds reviewed and do not see any that would elevate bili. Urine is dark color. Consider checking LFTs.  Plan: Doxycycline 100 mg twice daily x 7 days.Bilirubin noted in urine dipstick consider checking liver functions and CBC.Urine culture pending. Rest and drink fluids.

## 2021-12-11 LAB — URINE CULTURE
MICRO NUMBER:: 13060957
SPECIMEN QUALITY:: ADEQUATE

## 2021-12-16 ENCOUNTER — Other Ambulatory Visit: Payer: Self-pay

## 2021-12-16 ENCOUNTER — Ambulatory Visit: Payer: 59 | Admitting: Internal Medicine

## 2021-12-16 ENCOUNTER — Encounter: Payer: Self-pay | Admitting: Internal Medicine

## 2021-12-16 VITALS — BP 112/68 | HR 75 | Temp 98.3°F | Ht 62.0 in | Wt 174.0 lb

## 2021-12-16 DIAGNOSIS — N390 Urinary tract infection, site not specified: Secondary | ICD-10-CM

## 2021-12-16 DIAGNOSIS — E119 Type 2 diabetes mellitus without complications: Secondary | ICD-10-CM

## 2021-12-16 DIAGNOSIS — H6502 Acute serous otitis media, left ear: Secondary | ICD-10-CM

## 2021-12-16 DIAGNOSIS — J029 Acute pharyngitis, unspecified: Secondary | ICD-10-CM | POA: Diagnosis not present

## 2021-12-16 DIAGNOSIS — B9620 Unspecified Escherichia coli [E. coli] as the cause of diseases classified elsewhere: Secondary | ICD-10-CM | POA: Diagnosis not present

## 2021-12-16 LAB — POCT URINALYSIS DIPSTICK
Bilirubin, UA: NEGATIVE
Blood, UA: NEGATIVE
Glucose, UA: POSITIVE — AB
Ketones, UA: 5
Leukocytes, UA: NEGATIVE
Nitrite, UA: NEGATIVE
Protein, UA: NEGATIVE
Spec Grav, UA: 1.015 (ref 1.010–1.025)
Urobilinogen, UA: 0.2 E.U./dL
pH, UA: 5 (ref 5.0–8.0)

## 2021-12-16 MED ORDER — AZITHROMYCIN 250 MG PO TABS: ORAL_TABLET | ORAL | 0 refills | Status: AC

## 2021-12-16 MED ORDER — FLUCONAZOLE 150 MG PO TABS: 150.0000 mg | ORAL_TABLET | Freq: Once | ORAL | 0 refills | Status: AC

## 2021-12-16 NOTE — Progress Notes (Signed)
Zithromax ? ? ?Subjective:  ? ? Patient ID: Candice [name], female    DOB: [birth year], 66 y.o.   MRN: [id number] ? ?HPI 66 year old Female seen for follow up on UTI.  Was seen on February 27 with dysuria and diagnosed with urinary tract infection and treated with doxycycline 100 mg twice daily for 7 days.  Urine culture grew E. coli.  No sensitivity given to doxycycline.  However clinically she is feeling better.  LE is negative.  Nitrite is negative. ? ?New issue today is sore throat.  No fever.  No chills.  No known strep throat exposure. ? ? ? ?Review of Systems Has some discolored sputum.  No urinary frequency.  No dysuria. ? ?   ?Objective:  ? Physical Exam ?Temperature 98.3 degrees pulse 75 blood pressure 112/68 pulse oximetry 97% weight 174 pounds ? ?No CVA tenderness.  Urine dipstick positive for glucose otherwise negative.  Clinically she has improved.  Since she has been on antibiotics recently, she was not checked for strep pharyngitis.  Left TM is full but not red.  Pharynx slightly injected without exudate.  Neck supple.  Chest clear.  No cervical adenopathy. ? ? ? ? ? ?   ?Assessment & Plan:  ?UTI has resolved status posttreatment with doxycycline ? ?Type 2 diabetes mellitus ? ?Acute pharyngitis ? ?Acute left serous otitis media ? ?Plan: Patient will take Zithromax Z-PAK 2 tabs day 1 followed by 1 tab days 2 through 5.  Given Diflucan to take if develops Candida vaginitis while on antibiotic therapy.  Continue to monitor Accu-Cheks. ? ?

## 2021-12-16 NOTE — Patient Instructions (Addendum)
UTI has resolved. Take Zithromax Z-pak for acute pharyngitis and serous otitis media as follows: 2 tabs Day 1 followed by one tab days 2-5.  Rest and drink fluids. ?

## 2022-02-12 ENCOUNTER — Ambulatory Visit: Payer: 59 | Admitting: Adult Health

## 2022-02-12 ENCOUNTER — Encounter: Payer: Self-pay | Admitting: Adult Health

## 2022-02-12 VITALS — BP 124/72 | HR 77 | Temp 97.9°F | Resp 17 | Ht 62.0 in | Wt 175.2 lb

## 2022-02-12 DIAGNOSIS — G4483 Primary cough headache: Secondary | ICD-10-CM

## 2022-02-12 DIAGNOSIS — J069 Acute upper respiratory infection, unspecified: Secondary | ICD-10-CM

## 2022-02-12 LAB — POC COVID19 BINAXNOW: SARS Coronavirus 2 Ag: NEGATIVE

## 2022-02-12 MED ORDER — PREDNISONE 20 MG PO TABS: ORAL_TABLET | ORAL | 0 refills | Status: DC

## 2022-02-12 NOTE — Progress Notes (Signed)
Assessment and Plan: ? ?Addilynne was seen today for cough. ? ?Diagnoses and all orders for this visit: ? ?Viral URI with cough ?Discussed the importance of avoiding unnecessary antibiotic therapy. ?Typical viral course discussed ?Suggested symptomatic OTC remedies. ?Nasal saline spray for congestion. ?Nasal steroids, continue allergy pill, oral steroids offered ?She declines cough syrup or tessalon; states will try delsym ?Follow up as needed if persistent sx past 7-10 days or rebound dx ?-     predniSONE (DELTASONE) 20 MG tablet; 2 tablets daily for 3 days, 1 tablet daily for 4 days. ?-     POC COVID-19 ? ?Further disposition pending results of labs. Discussed med's effects and SE's.   ?Over 20 minutes of exam, counseling, chart review, and critical decision making was performed.  ? ?Future Appointments  ?Date Time Provider Department Center  ?03/12/2022 11:00 AM Judd Gaudier, NP [facility] None  ?10/23/2022 10:00 AM Judd Gaudier, NP [facility] None  ? ? ?------------------------------------------------------------------------------------------------------------------ ? ? ?HPI ?BP 124/72   Pulse 77   Temp 97.9 ?F (36.6 ?C)   Resp 17   Ht 5\' 2"  (1.575 m)   Wt 175 lb 3.2 oz (79.5 kg)   SpO2 96%   BMI 32.04 kg/m?  ?66 y.o.female former smoker, with T2DM presents for evalaution of URI sx. Had negative covid 19 test today here in office.  ? ?Sx began 3 days ago with sore throat, mild/mod, next day developed cough from her throat, quickly settled into chest and productive of thick yellow mucus, now sore throat mainly with cough. Reports clear rhinitis.  ? ?She does have hx of seasonal allergies, started on sudafed and 10 mg ceterizine without notable improvement.  ? ?Past Medical History:  ?Diagnosis Date  ? Anxiety   ? Diabetes mellitus without complication (HCC)   ? Hx of colonic polyps   ? Fhx Colon cancer  ? Hyperlipidemia   ? Hypocholesteremia   ? Tobacco abuse   ?  ? ?Allergies  ?Allergen Reactions  ?  Levaquin [Levofloxacin] Hives and Other (See Comments)  ?  Tingling sensation to mouth, near LOC, blurred vision, ? ?  ? Iodine   ?  Unknown reaction   ? Levofloxacin In D5w Hives  ?  Hives  ? Metformin And Related Diarrhea  ? Shellfish Allergy   ?  Unknown reaction   ? Vitamin D Analogs Diarrhea  ?  High dose Vitamin D caused diarrhea  ? Amoxicillin Rash  ? Eggs Or Egg-Derived Products   ?  Low, by skin test only, local itching when injected   ? Kombiglyze [Saxagliptin-Metformin Er] Other (See Comments)  ?  Bloating  ? Latex Itching and Rash  ? Penicillins Rash  ? ? ?Current Outpatient Medications on File Prior to Visit  ?Medication Sig  ? aspirin EC 81 MG tablet Take 81 mg by mouth daily.  ? Cholecalciferol (VITAMIN D3) 50 MCG (2000 UT) TABS Take 5,000 Units by mouth daily.  ? Cyanocobalamin (B-12) 1000 MCG SUBL Place 1 tablet under the tongue daily.  ? doxycycline (VIBRA-TABS) 100 MG tablet Take 1 tablet (100 mg total) by mouth 2 (two) times daily.  ? glipiZIDE (GLUCOTROL) 5 MG tablet TAKE 2 TABLETS BEFORE BREAKFAST AND 1-2 TABLETS BEFORE MIDNIGHT.  ? glucose blood (ONETOUCH VERIO) test strip CHECK BLOOD SUGAR TWICE DAILY.  ? [name] 25 MG TABS tablet TAKE 1 TABLET ONCE DAILY.  ? meclizine (ANTIVERT) 25 MG tablet 1/2-1 pill up to 3 times daily for motion sickness/dizziness  ? montelukast (SINGULAIR)  10 MG tablet TAKE ONE TABLET AT BEDTIME.  ? OneTouch Delica Lancets 33G MISC Check 2x a day  ? OVER THE COUNTER MEDICATION Takes an OTC Allertec  ? rosuvastatin (CRESTOR) 40 MG tablet TAKE 1 TABLET ONCE A DAY TO CONTROL CHOLESTEROL.  ? [name] 3 MG/0.5ML SOPN INJECT 3MG  INTO THE SKIN ONCE A WEEK AS DIRECTED.  ? triamcinolone cream (KENALOG) 0.1 % Apply 1 application topically 3 (three) times daily. (Patient not taking: Reported on 02/12/2022)  ? ?No current facility-administered medications on file prior to visit.  ? ? ?ROS: all negative except above.  ? ?Physical Exam: ? ?BP 124/72   Pulse 77   Temp 97.9 ?F (36.6  ?C)   Resp 17   Ht 5\' 2"  (1.575 m)   Wt 175 lb 3.2 oz (79.5 kg)   SpO2 96%   BMI 32.04 kg/m?  ? ?General Appearance: Well nourished, in no apparent distress. ?Eyes: PERRLA, EOMs, conjunctiva no swelling or erythema ?Sinuses: No Frontal/maxillary tenderness ?ENT/Mouth: Ext aud canals clear, TMs without erythema,  does have mild bulging on left. No erythema, swelling, or exudate on post pharynx.  Tonsils not swollen or erythematous. Mild soft pallet cobblestoning. Hearing normal.  ?Neck: Supple ?Respiratory: Respiratory effort normal, BS equal bilaterally without rales, rhonchi, wheezing or stridor.  ?Cardio: RRR with no MRGs. Brisk peripheral pulses without edema.  ?Abdomen: Soft, + BS.  Non tender, no guarding, rebound, hernias, masses. ?Lymphatics: Non tender without lymphadenopathy.  ?Musculoskeletal: normal gait.  ?Skin: Warm, dry without rashes, lesions, ecchymosis.  ?Neuro: Cranial nerves intact. Normal muscle tone, no cerebellar symptoms. Sensation intact.  ?Psych: Awake and oriented X 3, normal affect, Insight and Judgment appropriate.  ?  ? ?04/14/2022, NP ?2:17 PM ?[facility] ? ?

## 2022-03-12 ENCOUNTER — Encounter: Payer: Self-pay | Admitting: Adult Health

## 2022-03-12 ENCOUNTER — Ambulatory Visit: Payer: 59 | Admitting: Adult Health

## 2022-03-12 VITALS — BP 110/72 | HR 76 | Temp 97.9°F | Wt 175.0 lb

## 2022-03-12 DIAGNOSIS — N182 Chronic kidney disease, stage 2 (mild): Secondary | ICD-10-CM

## 2022-03-12 DIAGNOSIS — E1169 Type 2 diabetes mellitus with other specified complication: Secondary | ICD-10-CM

## 2022-03-12 DIAGNOSIS — E559 Vitamin D deficiency, unspecified: Secondary | ICD-10-CM

## 2022-03-12 DIAGNOSIS — Z79899 Other long term (current) drug therapy: Secondary | ICD-10-CM | POA: Diagnosis not present

## 2022-03-12 DIAGNOSIS — E1122 Type 2 diabetes mellitus with diabetic chronic kidney disease: Secondary | ICD-10-CM | POA: Diagnosis not present

## 2022-03-12 DIAGNOSIS — Z87891 Personal history of nicotine dependence: Secondary | ICD-10-CM

## 2022-03-12 DIAGNOSIS — E669 Obesity, unspecified: Secondary | ICD-10-CM

## 2022-03-12 DIAGNOSIS — E785 Hyperlipidemia, unspecified: Secondary | ICD-10-CM

## 2022-03-12 MED ORDER — OZEMPIC (0.25 OR 0.5 MG/DOSE) 2 MG/1.5ML ~~LOC~~ SOPN: PEN_INJECTOR | SUBCUTANEOUS | 0 refills | Status: DC

## 2022-03-12 MED ORDER — OZEMPIC (0.25 OR 0.5 MG/DOSE) 2 MG/1.5ML ~~LOC~~ SOPN
PEN_INJECTOR | SUBCUTANEOUS | 0 refills | Status: DC
Start: 2022-03-12 — End: 2022-03-12

## 2022-03-12 NOTE — Progress Notes (Signed)
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 Gherghe Will try switching trulicity to ozempic Follow up for next dose up 1 mg/week if tolerates starting taper as prescribed without SE Reminded risk with glipizide/hypoglycemia - start checking glucose Continue diet and exercise.  Perform daily foot/skin check, notify office of any concerning changes.  Check A1C  Hyperlipidemia associated with T2DM (HCC) Has been at LDL <70 goal with rosuvastatin 40 mg daily, reports improved compliance with pill box  Continue low cholesterol diet and exercise.  Defer lipid panel today per patient preference   Obesity with co morbidities - BMI 32 Long discussion about weight loss, diet, and exercise Recommended diet heavy in fruits and veggies and low in animal meats, cheeses, and dairy products, appropriate calorie intake Discussed ideal weight for height Patient will work on consistency with diet, increase exercise, reduce soda, increase water Will try switching to ozempic Will follow up in 3 months  Vitamin D Def Very low at last visit; reports has been taking regularly since then  Recheck vit D today after discussion/patient preference  Anxiety Doing well off of meds Stress management techniques discussed, increase water, good sleep hygiene discussed, increase exercise, and increase veggies.   Former smoker - 20 pack year, quit 10/2017 Doing well with cessation since Jan 2019;  Low dose CT pending- will have referral coordinator follow up on status  No orders of the defined types were placed in this encounter.    Continue diet and meds as discussed. Further disposition pending results of labs. Discussed med's effects and SE's.   Over 30 minutes of exam, counseling, chart review, and critical decision making  was performed.   Future Appointments  Date Time Provider Department Center  10/23/2022 10:00 AM Corbett, Ashley, NP [facility] None   ----------------------------------------------------------------------------------------------------------------------  HPI 65 y.o. female  presents for 3 month follow up on cholesterol, T2 diabetes with CKD, obesity and vitamin D deficiency.   She is recovering from URI/bronchitis, did finish steroid, mild non-productive cough intermittently, improving.   She reported increased stress related to her daughter going through a divorce/custody battle, also had difficult birth in 02/2021.  Has been stressful but patient reports is managing fairly well. She reports tapered off of celexa and still managing. Declines other med at this time.   She is a former smoker, 20 pack year hx, quit 2019, discussed low dose CT and was ordered at Jan 2023 CPE but states was never contacted.   BMI is Body mass index is 32.01 kg/m., she has been working on diet and exercise, reports was down 10 lb, walking regularly but fell off the wagon and regained on prednisone, plans to restart She reports reduced soda - 3 > 1, doing lots of water instead  Doing veggies but family full of picky eaters, hard to balance Wt Readings from Last 3 Encounters:  03/12/22 175 lb (79.4 kg)  02/12/22 175 lb 3.2 oz (79.5 kg)  12/16/21 174 lb (78.9 kg)   Today their BP is BP: 110/72 Declines ACEi/ARB diabetic renal protection for now, low BPs at home.   She does workout. She denies chest pain, shortness of breath, dizziness.   She is on cholesterol medication (rosuvastatin 40 mg daily, reports doing better remembering to take daily using pill box) and denies myalgias. Her cholesterol is at goal. The cholesterol   last visit was:   Lab Results  Component Value Date   CHOL 121 10/23/2021   HDL 42 (L) 10/23/2021   LDLCALC 53 10/23/2021   TRIG 184 (H) 10/23/2021   CHOLHDL 2.9 10/23/2021    She has  not been working on diet and exercise for T2 diabetes ([name] 25 mg, trulicity 3 mg weekly, glipizide 10 mg AM, 5 mg with lunch intermittently and 5 mg with dinner, hx of metformin intolerance). She denies hypoglycemia , increased appetite, nausea, paresthesia of the feet, polydipsia, polyuria, visual disturbances and vomiting.  She admits hasn't been checking glucose but has "felt good"-  Last A1C in the office was:  Lab Results  Component Value Date   HGBA1C 8.2 (H) 10/23/2021   She has CKD I/II associated with T2DM monitored at this office; pt declined ACEi/ARB, normal BPs. Last GFR:   Lab Results  Component Value Date   EGFR 95 10/23/2021   Lab Results  Component Value Date   MICRALBCREAT 8 08/29/2020   Patient reports has been taking vitamin D more regularly since last visit -   Lab Results  Component Value Date   VD25OH 26 (L) 10/23/2021     Admits hasn't been supplementing, plans to start Lab Results  Component Value Date   VITAMINB12 328 10/23/2021     Current Medications:  Current Outpatient Medications on File Prior to Visit  Medication Sig   aspirin EC 81 MG tablet Take 81 mg by mouth daily.   Cholecalciferol (VITAMIN D3) 50 MCG (2000 UT) TABS Take 5,000 Units by mouth daily.   citalopram (CELEXA) 40 MG tablet Take 20 mg by mouth daily.   glipiZIDE (GLUCOTROL) 5 MG tablet TAKE 2 TABLETS BEFORE BREAKFAST AND 1-2 TABLETS BEFORE MIDNIGHT.   glucose blood (ONETOUCH VERIO) test strip CHECK BLOOD SUGAR TWICE DAILY.   [name] 25 MG TABS tablet TAKE 1 TABLET ONCE DAILY.   meclizine (ANTIVERT) 25 MG tablet 1/2-1 pill up to 3 times daily for motion sickness/dizziness   montelukast (SINGULAIR) 10 MG tablet TAKE ONE TABLET AT BEDTIME.   OVER THE COUNTER MEDICATION Takes an OTC Allertec   rosuvastatin (CRESTOR) 40 MG tablet TAKE 1 TABLET ONCE A DAY TO CONTROL CHOLESTEROL.   [name] 3 MG/0.5ML SOPN INJECT 3MG INTO THE SKIN ONCE A WEEK AS DIRECTED.   Cyanocobalamin (B-12)  1000 MCG SUBL Place 1 tablet under the tongue daily. (Patient not taking: Reported on 03/12/2022)   OneTouch Delica Lancets 33G MISC Check 2x a day (Patient not taking: Reported on 03/12/2022)   triamcinolone cream (KENALOG) 0.1 % Apply 1 application topically 3 (three) times daily. (Patient not taking: Reported on 02/12/2022)   No current facility-administered medications on file prior to visit.     Allergies:  Allergies  Allergen Reactions   Levaquin [Levofloxacin] Hives and Other (See Comments)    Tingling sensation to mouth, near LOC, blurred vision,     Iodine     Unknown reaction    Levofloxacin In D5w Hives    Hives   Metformin And Related Diarrhea   Shellfish Allergy     Unknown reaction    Vitamin D Analogs Diarrhea    High dose Vitamin D caused diarrhea   Amoxicillin Rash   Eggs Or Egg-Derived Products     Low, by skin test only, local itching when injected    Kombiglyze [Saxagliptin-Metformin Er] Other (See Comments)    Bloating   Latex Itching and Rash   Penicillins Rash       Medical History:  Past Medical History:  Diagnosis Date   Anxiety    Diabetes mellitus without complication ([place])    Hx of colonic polyps    Fhx Colon cancer   Hyperlipidemia    Hypocholesteremia    Tobacco abuse    Family history- Reviewed and unchanged Social history- Reviewed and unchanged   Review of Systems:  Review of Systems  Constitutional:  Negative for malaise/fatigue and weight loss.  HENT:  Negative for hearing loss, sore throat and tinnitus.   Eyes:  Negative for blurred vision and double vision.  Respiratory:  Positive for cough (mild dry cough, improving, following URI). Negative for sputum production, shortness of breath and wheezing.   Cardiovascular:  Negative for chest pain, palpitations, orthopnea, claudication and leg swelling.  Gastrointestinal:  Negative for abdominal pain, blood in stool, constipation, diarrhea, heartburn, melena, nausea and vomiting.   Genitourinary: Negative.   Musculoskeletal:  Positive for back pain (lumbar/sacral) and myalgias (hips and thighs, tender without weakness). Negative for falls, joint pain and neck pain.  Skin:  Negative for rash.  Neurological:  Negative for dizziness, tingling, sensory change, focal weakness, weakness and headaches.  Endo/Heme/Allergies:  Negative for polydipsia.  Psychiatric/Behavioral:  Negative for depression and substance abuse. The patient is not nervous/anxious and does not have insomnia.   All other systems reviewed and are negative.  Physical Exam: BP 110/72   Pulse 76   Temp 97.9 F (36.6 C)   Wt 175 lb (79.4 kg)   SpO2 99%   BMI 32.01 kg/m  Wt Readings from Last 3 Encounters:  03/12/22 175 lb (79.4 kg)  02/12/22 175 lb 3.2 oz (79.5 kg)  12/16/21 174 lb (78.9 kg)   General Appearance: Well nourished, in no apparent distress. Eyes: PERRLA, EOMs, conjunctiva no swelling or erythema Sinuses: No Frontal/maxillary tenderness ENT/Mouth: Ext aud canals clear, TMs without erythema, bulging. No erythema, swelling, or exudate on post pharynx.  Tonsils not swollen or erythematous. Hearing normal.  Neck: Supple, thyroid normal.  Respiratory: Respiratory effort normal, BS equal bilaterally without rales, rhonchi, wheezing or stridor.  Cardio: RRR with no MRGs. Brisk peripheral pulses without edema.  Abdomen: Soft, + BS.  Non tender, no guarding, rebound, hernias, masses. Lymphatics: Non tender without lymphadenopathy.  Musculoskeletal: Full active ROM intact, slow steady gait. Full ROM lumbar, some SI tenderness bil, tenderness throughout gluteal and lateral thigh muscles bilaterally without distinct points of tenderness, no trochanteric tenderness, strength 5/5 throughout. Normal gait.  Skin: Warm, dry without rashes, lesions, ecchymosis.  Neuro: Cranial nerves intact. No cerebellar symptoms. Sensation intact Psych: Awake and oriented X 3, normal affect, Insight and Judgment  appropriate.   Candice Ribas, NP 11:34 AM [name]

## 2022-03-13 LAB — CBC WITH DIFFERENTIAL/PLATELET
Absolute Monocytes: 483 cells/uL (ref 200–950)
Basophils Absolute: 28 cells/uL (ref 0–200)
Basophils Relative: 0.4 %
Eosinophils Absolute: 71 cells/uL (ref 15–500)
Eosinophils Relative: 1 %
HCT: 47.9 % — ABNORMAL HIGH (ref 35.0–45.0)
Hemoglobin: 16.2 g/dL — ABNORMAL HIGH (ref 11.7–15.5)
Lymphs Abs: 2038 cells/uL (ref 850–3900)
MCH: 31.6 pg (ref 27.0–33.0)
MCHC: 33.8 g/dL (ref 32.0–36.0)
MCV: 93.4 fL (ref 80.0–100.0)
MPV: 11.1 fL (ref 7.5–12.5)
Monocytes Relative: 6.8 %
Neutro Abs: 4480 cells/uL (ref 1500–7800)
Neutrophils Relative %: 63.1 %
Platelets: 229 10*3/uL (ref 140–400)
RBC: 5.13 10*6/uL — ABNORMAL HIGH (ref 3.80–5.10)
RDW: 12.5 % (ref 11.0–15.0)
Total Lymphocyte: 28.7 %
WBC: 7.1 10*3/uL (ref 3.8–10.8)

## 2022-03-13 LAB — URINALYSIS, ROUTINE W REFLEX MICROSCOPIC
Bilirubin Urine: NEGATIVE
Hgb urine dipstick: NEGATIVE
Ketones, ur: NEGATIVE
Leukocytes,Ua: NEGATIVE
Nitrite: NEGATIVE
Protein, ur: NEGATIVE
Specific Gravity, Urine: 1.037 — ABNORMAL HIGH (ref 1.001–1.035)
pH: 5.5 (ref 5.0–8.0)

## 2022-03-13 LAB — COMPLETE METABOLIC PANEL WITH GFR
AG Ratio: 2 (calc) (ref 1.0–2.5)
ALT: 14 U/L (ref 6–29)
AST: 11 U/L (ref 10–35)
Albumin: 4.8 g/dL (ref 3.6–5.1)
Alkaline phosphatase (APISO): 110 U/L (ref 37–153)
BUN: 17 mg/dL (ref 7–25)
CO2: 27 mmol/L (ref 20–32)
Calcium: 9.9 mg/dL (ref 8.6–10.4)
Chloride: 103 mmol/L (ref 98–110)
Creat: 0.76 mg/dL (ref 0.50–1.05)
Globulin: 2.4 g/dL (calc) (ref 1.9–3.7)
Glucose, Bld: 221 mg/dL — ABNORMAL HIGH (ref 65–99)
Potassium: 4.6 mmol/L (ref 3.5–5.3)
Sodium: 139 mmol/L (ref 135–146)
Total Bilirubin: 0.5 mg/dL (ref 0.2–1.2)
Total Protein: 7.2 g/dL (ref 6.1–8.1)
eGFR: 88 mL/min/{1.73_m2} (ref >=60)

## 2022-03-13 LAB — HEMOGLOBIN A1C
Hgb A1c MFr Bld: 8.2 % of total Hgb — ABNORMAL HIGH (ref <5.7)
Mean Plasma Glucose: 189 mg/dL
eAG (mmol/L): 10.4 mmol/L

## 2022-03-13 LAB — MICROALBUMIN / CREATININE URINE RATIO
Creatinine, Urine: 36 mg/dL (ref 20–275)
Microalb Creat Ratio: 6 mcg/mg creat (ref <30)
Microalb, Ur: 0.2 mg/dL

## 2022-03-13 LAB — LIPID PANEL
Cholesterol: 164 mg/dL (ref <200)
HDL: 43 mg/dL — ABNORMAL LOW (ref >=50)
LDL Cholesterol (Calc): 85 mg/dL (calc)
Non-HDL Cholesterol (Calc): 121 mg/dL (calc) (ref <130)
Total CHOL/HDL Ratio: 3.8 (calc) (ref <5.0)
Triglycerides: 302 mg/dL — ABNORMAL HIGH (ref <150)

## 2022-03-13 LAB — VITAMIN D 25 HYDROXY (VIT D DEFICIENCY, FRACTURES): Vit D, 25-Hydroxy: 34 ng/mL (ref 30–100)

## 2022-03-13 LAB — TSH: TSH: 0.91 mIU/L (ref 0.40–4.50)

## 2022-03-31 ENCOUNTER — Ambulatory Visit: Payer: 59 | Admitting: Internal Medicine

## 2022-03-31 ENCOUNTER — Ambulatory Visit
Admission: RE | Admit: 2022-03-31 | Discharge: 2022-03-31 | Disposition: A | Discharge status: Home / Self Care | Payer: 59 | Source: Ambulatory Visit | Attending: Internal Medicine | Admitting: Internal Medicine

## 2022-03-31 ENCOUNTER — Encounter: Payer: Self-pay | Admitting: Internal Medicine

## 2022-03-31 DIAGNOSIS — J029 Acute pharyngitis, unspecified: Secondary | ICD-10-CM

## 2022-03-31 DIAGNOSIS — E119 Type 2 diabetes mellitus without complications: Secondary | ICD-10-CM

## 2022-03-31 DIAGNOSIS — J22 Unspecified acute lower respiratory infection: Secondary | ICD-10-CM | POA: Diagnosis not present

## 2022-03-31 DIAGNOSIS — Z87891 Personal history of nicotine dependence: Secondary | ICD-10-CM

## 2022-03-31 DIAGNOSIS — R059 Cough, unspecified: Secondary | ICD-10-CM | POA: Diagnosis not present

## 2022-03-31 LAB — POCT RAPID STREP A (OFFICE): Rapid Strep A Screen: NEGATIVE

## 2022-03-31 MED ORDER — AZITHROMYCIN 250 MG PO TABS: ORAL_TABLET | ORAL | 0 refills | Status: AC

## 2022-03-31 MED ORDER — BENZONATATE 100 MG PO CAPS: 100.0000 mg | ORAL_CAPSULE | Freq: Three times a day (TID) | ORAL | 0 refills | Status: DC | PRN

## 2022-03-31 MED ORDER — CEFTRIAXONE SODIUM 1 G IJ SOLR
1.0000 g | Freq: Once | INTRAMUSCULAR | Status: AC
  Administered 2022-03-31: 1 g via INTRAMUSCULAR

## 2022-03-31 NOTE — Patient Instructions (Addendum)
Rocephin 1 g IM.  Zithromax Z-PAK 2 tabs day 1 followed by 1 tab days 2 through 5.  Tessalon Perles 100 mg up to 3 times daily as needed for cough.  Have chest x-ray.  Rest and drink fluids.  Monitor Accu-Cheks.  Please have low-dose CT scanning for lung cancer screening.

## 2022-03-31 NOTE — Progress Notes (Signed)
   Subjective:    Patient ID: Candice [name], female    DOB: 09-01-1956, 66 y.o.   MRN: [id number]  HPI [place] has been coughing since early May.Grandchildren have been ill recently.She has Type 2 DM, HTN, BMI 32, Vitamin D deficiency,anxiety, hx smoking  with 20 pack year hx- quit in 2019. Cough seems worse today- deep and congested. Cough is productive. No fever or chills.   No recent CXR has been contacted by PCP office for low dose CT scanning with hx of smoking. She should do this screening.  Hgb AIC recently staying at 8.2% since January 2023.   Denies hemoptysis, shaking chills, SOB.   Review of Systems has fatigue     Objective:   Physical Exam  Afebrile. RR normal. No audible wheezing Skin: warm and dry. No cervical adenopathy. TMs slightly full left greater than right. Pharynx red without exudate.  Rapid strep screen is negative.  Neck supple. Chest exam remarkable for coarse breath sounds with possible rales in left lateral chest      Assessment & Plan:  Protracted lower respiratory infection Plan: one gram IM Rocephin given in office.  Zithromax Z-PAK 2 tabs day 1 followed by 1 tab days 2 through 5.  Tessalon Perles 100 mg 3 times daily as needed for cough.  Chest x-ray ordered.  Addendum: Chest x-ray shows no evidence of congestive heart failure, pleural effusions or infiltrates.  Mild to moderate multilevel degenerative disc changes of the thoracic spine.  Agree patient should do low-dose chest CT scan when feeling better

## 2022-04-03 ENCOUNTER — Other Ambulatory Visit: Payer: Self-pay | Admitting: Internal Medicine

## 2022-04-16 ENCOUNTER — Other Ambulatory Visit: Payer: Self-pay | Admitting: Adult Health

## 2022-04-16 ENCOUNTER — Encounter: Payer: Self-pay | Admitting: Adult Health

## 2022-06-05 ENCOUNTER — Ambulatory Visit: Payer: 59 | Admitting: Internal Medicine

## 2022-06-05 ENCOUNTER — Encounter: Payer: Self-pay | Admitting: Internal Medicine

## 2022-06-05 VITALS — BP 114/72 | HR 76 | Temp 98.3°F

## 2022-06-05 DIAGNOSIS — E119 Type 2 diabetes mellitus without complications: Secondary | ICD-10-CM | POA: Diagnosis not present

## 2022-06-05 DIAGNOSIS — M545 Low back pain, unspecified: Secondary | ICD-10-CM | POA: Diagnosis not present

## 2022-06-05 DIAGNOSIS — R8290 Unspecified abnormal findings in urine: Secondary | ICD-10-CM

## 2022-06-05 DIAGNOSIS — A491 Streptococcal infection, unspecified site: Secondary | ICD-10-CM | POA: Diagnosis not present

## 2022-06-05 LAB — POCT URINALYSIS DIPSTICK
Glucose, UA: POSITIVE — AB
Spec Grav, UA: 1.015 (ref 1.010–1.025)
pH, UA: 5 (ref 5.0–8.0)

## 2022-06-05 MED ORDER — DOXYCYCLINE HYCLATE 100 MG PO TABS: 100.0000 mg | ORAL_TABLET | Freq: Two times a day (BID) | ORAL | 0 refills | Status: DC

## 2022-06-05 MED ORDER — FLUCONAZOLE 150 MG PO TABS: 150.0000 mg | ORAL_TABLET | Freq: Once | ORAL | 0 refills | Status: AC

## 2022-06-05 NOTE — Progress Notes (Signed)
   Subjective:    Patient ID: Candice [name], female    DOB: 07/03/1956, 66 y.o.   MRN: [id number]  HPI Patient has noticed right sided back pain for several days along with odor in her urine.  Urine dipstick today is negative for LE and nitrite but because of her complaint of strong odor it was sent for culture.  We discussed getting labs but decided against them since she is not febrile.  She does not have dysuria.  No shaking chills but has some malaise and fatigue.  She has history of type 2 diabetes mellitus treated with Ozempic.  In May her hemoglobin A1c was 8.2%.   She did have an E. coli UTI in February 2023.    Review of Systems denies fever or chills.  No vomiting.     Objective:   Physical Exam  No CVA tenderness.  Vital signs reviewed.      Assessment & Plan:   Right low back pain  Malaise and fatigue  Type 2 diabetes mellitus  Complains of strong urine odor but urine dipstick shows glucose but no LE or nitrite.  Culture was sent.  History of E. coli UTI February 2023  Plan: Patient will be treated for presumed UTI at this time pending culture results.  She was given doxycycline 100 mg twice daily for 7 days.  May need additional testing if symptoms persist or not improving.  May take Diflucan tablet 150 mg tablet by mouth if develops Candida vaginitis symptoms while on antibiotics.  Rest and stay well-hydrated.

## 2022-06-05 NOTE — Patient Instructions (Addendum)
Urine culture has been sent.  Please rest and drink plenty of fluids.  Take doxycycline 100 mg twice daily for 7 days pending urine culture results.  May take Diflucan 150 mg tablet if develops Candida vaginitis symptoms while on antibiotics.   Addendum: Doxycycline not the best choice for strep agalactiae. Switch to keflex 500 mg 4 times daily x 7 days #28

## 2022-06-06 LAB — URINE CULTURE
MICRO NUMBER:: 13826856
SPECIMEN QUALITY:: ADEQUATE

## 2022-06-07 MED ORDER — CEPHALEXIN 500 MG PO CAPS: 500.0000 mg | ORAL_CAPSULE | Freq: Four times a day (QID) | ORAL | 0 refills | Status: DC

## 2022-06-07 NOTE — Addendum Note (Signed)
Addended by: Margaree Mackintosh on: 06/07/2022 12:24 PM   Modules accepted: Orders

## 2022-06-11 ENCOUNTER — Ambulatory Visit: Payer: 59 | Admitting: Nurse Practitioner

## 2022-06-17 ENCOUNTER — Other Ambulatory Visit: Payer: Self-pay

## 2022-06-17 DIAGNOSIS — E1169 Type 2 diabetes mellitus with other specified complication: Secondary | ICD-10-CM

## 2022-06-17 MED ORDER — ROSUVASTATIN CALCIUM 40 MG PO TABS: ORAL_TABLET | ORAL | 3 refills | Status: DC

## 2022-06-17 MED ORDER — EMPAGLIFLOZIN 25 MG PO TABS: 25.0000 mg | ORAL_TABLET | Freq: Every day | ORAL | 3 refills | Status: DC

## 2022-06-17 NOTE — Addendum Note (Signed)
Addended by: Dionicio Stall on: 06/17/2022 03:53 PM   Modules accepted: Orders

## 2022-06-18 ENCOUNTER — Ambulatory Visit: Payer: 59 | Admitting: Nurse Practitioner

## 2022-06-18 ENCOUNTER — Encounter: Payer: Self-pay | Admitting: Nurse Practitioner

## 2022-06-18 VITALS — BP 110/60 | HR 74 | Temp 97.3°F | Ht 62.0 in | Wt 175.2 lb

## 2022-06-18 DIAGNOSIS — E669 Obesity, unspecified: Secondary | ICD-10-CM | POA: Diagnosis not present

## 2022-06-18 DIAGNOSIS — E785 Hyperlipidemia, unspecified: Secondary | ICD-10-CM

## 2022-06-18 DIAGNOSIS — E538 Deficiency of other specified B group vitamins: Secondary | ICD-10-CM

## 2022-06-18 DIAGNOSIS — Z87891 Personal history of nicotine dependence: Secondary | ICD-10-CM

## 2022-06-18 DIAGNOSIS — E1122 Type 2 diabetes mellitus with diabetic chronic kidney disease: Secondary | ICD-10-CM

## 2022-06-18 DIAGNOSIS — F411 Generalized anxiety disorder: Secondary | ICD-10-CM

## 2022-06-18 DIAGNOSIS — E1169 Type 2 diabetes mellitus with other specified complication: Secondary | ICD-10-CM

## 2022-06-18 DIAGNOSIS — E66811 Obesity, class 1: Secondary | ICD-10-CM

## 2022-06-18 DIAGNOSIS — N182 Chronic kidney disease, stage 2 (mild): Secondary | ICD-10-CM

## 2022-06-18 DIAGNOSIS — E559 Vitamin D deficiency, unspecified: Secondary | ICD-10-CM | POA: Diagnosis not present

## 2022-06-18 DIAGNOSIS — Z79899 Other long term (current) drug therapy: Secondary | ICD-10-CM

## 2022-06-18 NOTE — Progress Notes (Signed)
FOLLOW UP  Assessment and Plan:   Diabetes with diabetic chronic kidney disease ([place]) Continue Semaglutide. Discussed discontinuation of [name] and/or Glipizide as A1c improves and weight loss continues Education: Reviewed 'ABCs' of diabetes management  Discussed goals to be met and/or maintained include A1C (<7) Blood pressure (<130/80) Cholesterol (LDL <70) Continue Eye Exam yearly  Continue Dental Exam Q6 mo Discussed dietary recommendations Discussed Physical Activity recommendations Foot exam UTD Check A1C   Hyperlipidemia associated with T2DM ([place]) Discussed lifestyle modifications. Recommended diet heavy in fruits and veggies, omega 3's. Decrease consumption of animal meats, cheeses, and dairy products. Remain active and exercise as tolerated. Continue to monitor. Check lipids/TSH   Obesity with co morbidities - BMI 32 Discussed appropriate BMI Goal of losing 1 lb per month. Diet modification. Physical activity. Encouraged/praised to build confidence.   Vitamin D Def Continue supplement. Monitor levels  Anxiety Doing well off of meds Stress management techniques discussed, increase water, good sleep hygiene discussed, increase exercise, and increase veggies.   Former smoker - 20 pack year, quit 10/2017 Doing well with cessation since Jan 2019;  Low dose CT pending- will have referral coordinator follow up on status  Vitamin B12 Deficiency Review  macrocytic anemia. Continue to monitor. Continue supplement.  Orders Placed This Encounter  Procedures   CBC with Differential/Platelet   COMPLETE METABOLIC PANEL WITH GFR   Lipid panel   Hemoglobin A1c   VITAMIN D 25 Hydroxy (Vit-D Deficiency, Fractures)   Vitamin B12     Continue diet and meds as discussed. Further disposition pending results of labs. Discussed med's effects and SE's.    Over 20 minutes of exam, counseling, chart review, and critical decision making was performed.   Future  Appointments  Date Time Provider [place]  10/23/2022 10:00 AM Darrol Jump, NP [facility] None   ----------------------------------------------------------------------------------------------------------------------  HPI 66 y.o. female  presents for 3 month follow up on cholesterol, T2 diabetes with CKD, obesity and vitamin D deficiency.   She is a former smoker, 20 pack year hx, quit 2019.  She is due for low dose CT Scan  BMI is Body mass index is 32.04 kg/m., she has been working on diet and exercise, started ozempic over the last 6 weeks. Wt Readings from Last 3 Encounters:  06/18/22 175 lb 3.2 oz (79.5 kg)  03/12/22 175 lb (79.4 kg)  02/12/22 175 lb 3.2 oz (79.5 kg)   Today their BP is BP: 110/60 Declines ACEi/ARB diabetic renal protection for now, low BPs at home.   She does workout. She denies chest pain, shortness of breath, dizziness.   She is on cholesterol medication (rosuvastatin 40 mg daily, reports doing better remembering to take daily using pill box) and denies myalgias. Her cholesterol is at goal. The cholesterol last visit was:   Lab Results  Component Value Date   CHOL 164 03/12/2022   HDL 43 (L) 03/12/2022   LDLCALC 85 03/12/2022   TRIG 302 (H) 03/12/2022   CHOLHDL 3.8 03/12/2022    She has been working on diet and exercise for T2 diabetes ([name] 25 mg, trulicity 3 mg weekly, glipizide 10 mg AM, 5 mg with lunch intermittently and 5 mg with dinner, hx of metformin intolerance). She denies hypoglycemia , increased appetite, nausea, paresthesia of the feet, polydipsia, polyuria, visual disturbances and vomiting.  Last A1C in the office was:  Lab Results  Component Value Date   HGBA1C 8.2 (H) 03/12/2022   She has CKD I/II associated with T2DM monitored  at this office; pt declined ACEi/ARB, normal BPs. Last GFR:   Lab Results  Component Value Date   EGFR 88 03/12/2022   EGFR 95 10/23/2021   Lab Results  Component Value Date   MICRALBCREAT 6  03/12/2022   Patient reports has been taking vitamin D more regularly since last visit -   Lab Results  Component Value Date   VD25OH 34 03/12/2022     She has started B12 supplement Lab Results  Component Value Date   QZRAQTMA26 333 10/23/2021     Current Medications:  Current Outpatient Medications on File Prior to Visit  Medication Sig   aspirin EC 81 MG tablet Take 81 mg by mouth daily.   Cholecalciferol (VITAMIN D3) 50 MCG (2000 UT) TABS Take 5,000 Units by mouth daily.   citalopram (CELEXA) 40 MG tablet Take 1 tablet by mouth Daily for Mood   doxycycline (VIBRA-TABS) 100 MG tablet Take 1 tablet (100 mg total) by mouth 2 (two) times daily.   empagliflozin ([name]) 25 MG TABS tablet Take 1 tablet (25 mg total) by mouth daily.   glipiZIDE (GLUCOTROL) 5 MG tablet TAKE 2 TABLETS BEFORE BREAKFAST AND 1-2 TABLETS BEFORE MIDNIGHT.   glucose blood (ONETOUCH VERIO) test strip CHECK BLOOD SUGAR TWICE DAILY.   meclizine (ANTIVERT) 25 MG tablet 1/2-1 pill up to 3 times daily for motion sickness/dizziness   montelukast (SINGULAIR) 10 MG tablet TAKE ONE TABLET AT BEDTIME.   OneTouch Delica Lancets 54T MISC Check 2x a day   OVER THE COUNTER MEDICATION Takes an OTC Allertec   OZEMPIC, 0.25 OR 0.5 MG/DOSE, 2 MG/3ML SOPN START BY INJECTING 0.25 MG INTO THE SKIN OF THE STOMACH ONCE WEEKLY. IF DOING WELL INCREASE TO 0.5 MG IN 2-4 WEEKS   rosuvastatin (CRESTOR) 40 MG tablet TAKE 1 TABLET ONCE A DAY TO CONTROL CHOLESTEROL.   benzonatate (TESSALON) 100 MG capsule Take 1 capsule (100 mg total) by mouth 3 (three) times daily as needed for cough. (Patient not taking: Reported on 06/18/2022)   cephALEXin (KEFLEX) 500 MG capsule Take 1 capsule (500 mg total) by mouth 4 (four) times daily.   Cyanocobalamin (B-12) 1000 MCG SUBL Place 1 tablet under the tongue daily. (Patient not taking: Reported on 03/12/2022)   triamcinolone cream (KENALOG) 0.1 % Apply 1 application topically 3 (three) times daily. (Patient  not taking: Reported on 02/12/2022)   No current facility-administered medications on file prior to visit.     Allergies:  Allergies  Allergen Reactions   Levaquin [Levofloxacin] Hives and Other (See Comments)    Tingling sensation to mouth, near LOC, blurred vision,     Iodine     Unknown reaction    Levofloxacin In D5w Hives    Hives   Metformin And Related Diarrhea   Shellfish Allergy     Unknown reaction    Vitamin D Analogs Diarrhea    High dose Vitamin D caused diarrhea   Amoxicillin Rash   Eggs Or Egg-Derived Products     Low, by skin test only, local itching when injected    Kombiglyze [Saxagliptin-Metformin Er] Other (See Comments)    Bloating   Latex Itching and Rash   Penicillins Rash     Medical History:  Past Medical History:  Diagnosis Date   Anxiety    Diabetes mellitus without complication (HCC)    Hx of colonic polyps    Fhx Colon cancer   Hyperlipidemia    Hypocholesteremia    Tobacco abuse  Family history- Reviewed and unchanged Social history- Reviewed and unchanged   Review of Systems:  Review of Systems  Constitutional:  Negative for malaise/fatigue and weight loss.  HENT:  Negative for hearing loss, sore throat and tinnitus.   Eyes:  Negative for blurred vision and double vision.  Respiratory:  Negative for cough, sputum production, shortness of breath and wheezing.   Cardiovascular:  Negative for chest pain, palpitations, orthopnea, claudication and leg swelling.  Gastrointestinal:  Negative for abdominal pain, blood in stool, constipation, diarrhea, heartburn, melena, nausea and vomiting.  Genitourinary: Negative.   Musculoskeletal:  Negative for back pain, falls, joint pain, myalgias and neck pain.  Skin:  Negative for rash.  Neurological:  Negative for dizziness, tingling, sensory change, focal weakness, weakness and headaches.  Endo/Heme/Allergies:  Negative for polydipsia.  Psychiatric/Behavioral:  Negative for depression and  substance abuse. The patient is not nervous/anxious and does not have insomnia.   All other systems reviewed and are negative.   Physical Exam: BP 110/60   Pulse 74   Temp (!) 97.3 F (36.3 C)   Ht 5' 2" (1.575 m)   Wt 175 lb 3.2 oz (79.5 kg)   SpO2 98%   BMI 32.04 kg/m  Wt Readings from Last 3 Encounters:  06/18/22 175 lb 3.2 oz (79.5 kg)  03/12/22 175 lb (79.4 kg)  02/12/22 175 lb 3.2 oz (79.5 kg)   General Appearance: Well nourished, in no apparent distress. Eyes: PERRLA, EOMs, conjunctiva no swelling or erythema Sinuses: No Frontal/maxillary tenderness ENT/Mouth: Ext aud canals clear, TMs without erythema, bulging. No erythema, swelling, or exudate on post pharynx.  Tonsils not swollen or erythematous. Hearing normal.  Neck: Supple, thyroid normal.  Respiratory: Respiratory effort normal, BS equal bilaterally without rales, rhonchi, wheezing or stridor.  Cardio: RRR with no MRGs. Brisk peripheral pulses without edema.  Abdomen: Soft, + BS.  Non tender, no guarding, rebound, hernias, masses. Lymphatics: Non tender without lymphadenopathy.  Musculoskeletal: Full active ROM intact, slow steady gait. Full ROM lumbar, some SI tenderness bil, tenderness throughout gluteal and lateral thigh muscles bilaterally without distinct points of tenderness, no trochanteric tenderness, strength 5/5 throughout. Normal gait.  Skin: Warm, dry without rashes, lesions, ecchymosis.  Neuro: Cranial nerves intact. No cerebellar symptoms. Sensation intact Psych: Awake and oriented X 3, normal affect, Insight and Judgment appropriate.   Darrol Jump, NP 12:32 PM [facility]

## 2022-06-18 NOTE — Patient Instructions (Signed)

## 2022-06-19 LAB — HEMOGLOBIN A1C
Hgb A1c MFr Bld: 8 % of total Hgb — ABNORMAL HIGH (ref <5.7)
Mean Plasma Glucose: 183 mg/dL
eAG (mmol/L): 10.1 mmol/L

## 2022-06-19 LAB — CBC WITH DIFFERENTIAL/PLATELET
Absolute Monocytes: 561 cells/uL (ref 200–950)
Basophils Absolute: 50 cells/uL (ref 0–200)
Basophils Relative: 0.7 %
Eosinophils Absolute: 128 cells/uL (ref 15–500)
Eosinophils Relative: 1.8 %
HCT: 48 % — ABNORMAL HIGH (ref 35.0–45.0)
Hemoglobin: 16.5 g/dL — ABNORMAL HIGH (ref 11.7–15.5)
Lymphs Abs: 2336 cells/uL (ref 850–3900)
MCH: 31.7 pg (ref 27.0–33.0)
MCHC: 34.4 g/dL (ref 32.0–36.0)
MCV: 92.3 fL (ref 80.0–100.0)
MPV: 10.8 fL (ref 7.5–12.5)
Monocytes Relative: 7.9 %
Neutro Abs: 4026 cells/uL (ref 1500–7800)
Neutrophils Relative %: 56.7 %
Platelets: 230 10*3/uL (ref 140–400)
RBC: 5.2 10*6/uL — ABNORMAL HIGH (ref 3.80–5.10)
RDW: 12.2 % (ref 11.0–15.0)
Total Lymphocyte: 32.9 %
WBC: 7.1 10*3/uL (ref 3.8–10.8)

## 2022-06-19 LAB — COMPLETE METABOLIC PANEL WITH GFR
AG Ratio: 2 (calc) (ref 1.0–2.5)
ALT: 14 U/L (ref 6–29)
AST: 13 U/L (ref 10–35)
Albumin: 4.9 g/dL (ref 3.6–5.1)
Alkaline phosphatase (APISO): 95 U/L (ref 37–153)
BUN: 18 mg/dL (ref 7–25)
CO2: 22 mmol/L (ref 20–32)
Calcium: 9.9 mg/dL (ref 8.6–10.4)
Chloride: 105 mmol/L (ref 98–110)
Creat: 0.86 mg/dL (ref 0.50–1.05)
Globulin: 2.4 g/dL (calc) (ref 1.9–3.7)
Glucose, Bld: 124 mg/dL — ABNORMAL HIGH (ref 65–99)
Potassium: 4.7 mmol/L (ref 3.5–5.3)
Sodium: 140 mmol/L (ref 135–146)
Total Bilirubin: 0.5 mg/dL (ref 0.2–1.2)
Total Protein: 7.3 g/dL (ref 6.1–8.1)
eGFR: 76 mL/min/{1.73_m2} (ref >=60)

## 2022-06-19 LAB — LIPID PANEL
Cholesterol: 153 mg/dL (ref <200)
HDL: 43 mg/dL — ABNORMAL LOW (ref >=50)
LDL Cholesterol (Calc): 78 mg/dL (calc)
Non-HDL Cholesterol (Calc): 110 mg/dL (calc) (ref <130)
Total CHOL/HDL Ratio: 3.6 (calc) (ref <5.0)
Triglycerides: 220 mg/dL — ABNORMAL HIGH (ref <150)

## 2022-06-19 LAB — VITAMIN D 25 HYDROXY (VIT D DEFICIENCY, FRACTURES): Vit D, 25-Hydroxy: 36 ng/mL (ref 30–100)

## 2022-06-19 LAB — VITAMIN B12: Vitamin B-12: 279 pg/mL (ref 200–1100)

## 2022-07-14 ENCOUNTER — Other Ambulatory Visit: Payer: Self-pay | Admitting: Internal Medicine

## 2022-07-14 DIAGNOSIS — Z1231 Encounter for screening mammogram for malignant neoplasm of breast: Secondary | ICD-10-CM

## 2022-07-16 ENCOUNTER — Ambulatory Visit
Admission: RE | Admit: 2022-07-16 | Discharge: 2022-07-16 | Disposition: A | Discharge status: Home / Self Care | Payer: 59 | Source: Ambulatory Visit | Attending: Internal Medicine | Admitting: Internal Medicine

## 2022-07-16 DIAGNOSIS — Z1231 Encounter for screening mammogram for malignant neoplasm of breast: Secondary | ICD-10-CM

## 2022-07-18 ENCOUNTER — Other Ambulatory Visit: Payer: Self-pay | Admitting: Nurse Practitioner

## 2022-08-19 ENCOUNTER — Other Ambulatory Visit: Payer: Self-pay | Admitting: Internal Medicine

## 2022-08-28 ENCOUNTER — Other Ambulatory Visit: Payer: Self-pay

## 2022-08-28 DIAGNOSIS — E1169 Type 2 diabetes mellitus with other specified complication: Secondary | ICD-10-CM

## 2022-08-28 MED ORDER — OZEMPIC (0.25 OR 0.5 MG/DOSE) 2 MG/3ML ~~LOC~~ SOPN: PEN_INJECTOR | SUBCUTANEOUS | 3 refills | Status: DC

## 2022-09-08 ENCOUNTER — Ambulatory Visit: Payer: 59 | Admitting: Internal Medicine

## 2022-09-08 ENCOUNTER — Encounter: Payer: Self-pay | Admitting: Internal Medicine

## 2022-09-08 VITALS — BP 114/66 | HR 81 | Temp 98.0°F | Ht 62.0 in | Wt 174.0 lb

## 2022-09-08 DIAGNOSIS — E119 Type 2 diabetes mellitus without complications: Secondary | ICD-10-CM

## 2022-09-08 DIAGNOSIS — M542 Cervicalgia: Secondary | ICD-10-CM

## 2022-09-08 DIAGNOSIS — B349 Viral infection, unspecified: Secondary | ICD-10-CM | POA: Diagnosis not present

## 2022-09-08 DIAGNOSIS — J029 Acute pharyngitis, unspecified: Secondary | ICD-10-CM | POA: Diagnosis not present

## 2022-09-08 LAB — POCT RAPID STREP A (OFFICE): Rapid Strep A Screen: NEGATIVE

## 2022-09-08 LAB — POC COVID19 BINAXNOW: SARS Coronavirus 2 Ag: NEGATIVE

## 2022-09-08 MED ORDER — FLUCONAZOLE 150 MG PO TABS: 150.0000 mg | ORAL_TABLET | Freq: Once | ORAL | 0 refills | Status: AC

## 2022-09-08 MED ORDER — DOXYCYCLINE HYCLATE 100 MG PO TABS: 100.0000 mg | ORAL_TABLET | Freq: Two times a day (BID) | ORAL | 0 refills | Status: DC

## 2022-09-08 NOTE — Patient Instructions (Addendum)
COVID and rapid strep screens are negative.  Take doxycycline 100 mg twice daily for 7 days.  Rest and stay well-hydrated.

## 2022-09-08 NOTE — Progress Notes (Signed)
   Subjective:    [name] ID: Candice [name], female    DOB: [birth year], 66 y.o.   MRN: [id number]  HPI 66 year old female seen today acutely with sore throat and neck pain.  Symptoms had onset this past weekend.  Daughter is also ill.  No recent vaccines.  [name] has a history of diabetes mellitus.  She has hypertriglyceridemia treated with rosuvastatin.  Currently on Ozempic and [name]    Review of Systems see above-no nausea vomiting or visual disturbances     Objective:   Physical Exam Blood pressure 114/66, pulse 81, temperature 98 degrees, pulse oximetry 98% ,weight 174 pounds, height 5 feet 2 inches ,BMI 31.83 Skin: Warm and dry.  No cervical adenopathy.  Chest is clear.  TMs are clear.  Neck is supple.  Pharynx is slightly injected without exudate.  Rapid strep screen and rapid COVID tests are both negative      Assessment & Plan:   Acute pharyngitis-nonstrep  Neck pain-no significant lymphadenopathy-not sure if this is related to pharyngitis or musculoskeletal pain  Plan: [name] will be treated with doxycycline at her request 100 mg twice daily for 7 days and given 1 dose of Diflucan 150 mg tablet to take if she has Candida vaginitis while on antibiotic therapy.  Is to rest and stay well-hydrated.  Monitor Accu-Cheks closely.

## 2022-09-12 ENCOUNTER — Telehealth: Payer: Self-pay | Admitting: Internal Medicine

## 2022-09-12 ENCOUNTER — Encounter: Payer: Self-pay | Admitting: Internal Medicine

## 2022-09-12 MED ORDER — ALBUTEROL SULFATE (2.5 MG/3ML) 0.083% IN NEBU: 2.5000 mg | INHALATION_SOLUTION | Freq: Four times a day (QID) | RESPIRATORY_TRACT | 1 refills | Status: DC | PRN

## 2022-09-12 MED ORDER — NIRMATRELVIR/RITONAVIR (PAXLOVID)TABLET: 3.0000 | ORAL_TABLET | Freq: Two times a day (BID) | ORAL | 0 refills | Status: AC

## 2022-09-12 NOTE — Telephone Encounter (Signed)
Patient has tested positive for Covid 19 and has her husband. She was placed on Doxycycline earlier in the week for a respiratory infection. She would like Paxlovid. Creatinine  is normal.Sending in regular strength Paxlovid and albuterol nebulizer solution. MJB, MD

## 2022-09-15 ENCOUNTER — Encounter: Payer: Self-pay | Admitting: Internal Medicine

## 2022-10-23 ENCOUNTER — Encounter: Payer: 59 | Admitting: Nurse Practitioner

## 2022-10-29 ENCOUNTER — Other Ambulatory Visit: Payer: Self-pay | Admitting: Nurse Practitioner

## 2022-10-29 ENCOUNTER — Encounter: Payer: Self-pay | Admitting: Nurse Practitioner

## 2022-10-29 ENCOUNTER — Ambulatory Visit: Payer: 59 | Admitting: Nurse Practitioner

## 2022-10-29 VITALS — BP 122/60 | HR 78 | Temp 97.3°F | Ht 65.0 in | Wt 175.8 lb

## 2022-10-29 DIAGNOSIS — F411 Generalized anxiety disorder: Secondary | ICD-10-CM

## 2022-10-29 DIAGNOSIS — Z1211 Encounter for screening for malignant neoplasm of colon: Secondary | ICD-10-CM

## 2022-10-29 DIAGNOSIS — I700 Atherosclerosis of aorta: Secondary | ICD-10-CM

## 2022-10-29 DIAGNOSIS — Z79899 Other long term (current) drug therapy: Secondary | ICD-10-CM

## 2022-10-29 DIAGNOSIS — Z1389 Encounter for screening for other disorder: Secondary | ICD-10-CM

## 2022-10-29 DIAGNOSIS — E1169 Type 2 diabetes mellitus with other specified complication: Secondary | ICD-10-CM

## 2022-10-29 DIAGNOSIS — Z0001 Encounter for general adult medical examination with abnormal findings: Secondary | ICD-10-CM

## 2022-10-29 DIAGNOSIS — Z136 Encounter for screening for cardiovascular disorders: Secondary | ICD-10-CM | POA: Diagnosis not present

## 2022-10-29 DIAGNOSIS — Z87891 Personal history of nicotine dependence: Secondary | ICD-10-CM

## 2022-10-29 DIAGNOSIS — E559 Vitamin D deficiency, unspecified: Secondary | ICD-10-CM

## 2022-10-29 DIAGNOSIS — R0989 Other specified symptoms and signs involving the circulatory and respiratory systems: Secondary | ICD-10-CM

## 2022-10-29 DIAGNOSIS — Z122 Encounter for screening for malignant neoplasm of respiratory organs: Secondary | ICD-10-CM

## 2022-10-29 DIAGNOSIS — Z1329 Encounter for screening for other suspected endocrine disorder: Secondary | ICD-10-CM

## 2022-10-29 DIAGNOSIS — D751 Secondary polycythemia: Secondary | ICD-10-CM

## 2022-10-29 DIAGNOSIS — E669 Obesity, unspecified: Secondary | ICD-10-CM

## 2022-10-29 DIAGNOSIS — E538 Deficiency of other specified B group vitamins: Secondary | ICD-10-CM

## 2022-10-29 DIAGNOSIS — Z0000 Encounter for general adult medical examination without abnormal findings: Secondary | ICD-10-CM

## 2022-10-29 DIAGNOSIS — E1122 Type 2 diabetes mellitus with diabetic chronic kidney disease: Secondary | ICD-10-CM

## 2022-10-29 DIAGNOSIS — H65193 Other acute nonsuppurative otitis media, bilateral: Secondary | ICD-10-CM

## 2022-10-29 DIAGNOSIS — Z8249 Family history of ischemic heart disease and other diseases of the circulatory system: Secondary | ICD-10-CM

## 2022-10-29 DIAGNOSIS — I10 Essential (primary) hypertension: Secondary | ICD-10-CM | POA: Diagnosis not present

## 2022-10-29 NOTE — Patient Instructions (Addendum)
Suggested ear wash recipe: 1 teaspoon of white vinegar. 1 teaspoon of rubbing alcohol. Mix the ingredients together in a cup. Use an eyedropper to put most of the mixture in the ear. Tilt the head to be sure the earwash runs all the way down inside the ear. Then tilt the head the other way and let the liquid drain out. Continue to monitor.    Healthy Eating Following a healthy eating pattern may help you to achieve and maintain a healthy body weight, reduce the risk of chronic disease, and live a long and productive life. It is important to follow a healthy eating pattern at an appropriate calorie level for your body. Your nutritional needs should be met primarily through food by choosing a variety of nutrient-rich foods. What are tips for following this plan? Reading food labels Read labels and choose the following: Reduced or low sodium. Juices with 100% fruit juice. Foods with low saturated fats and high polyunsaturated and monounsaturated fats. Foods with whole grains, such as whole wheat, cracked wheat, brown rice, and wild rice. Whole grains that are fortified with folic acid. This is recommended for women who are pregnant or who want to become pregnant. Read labels and avoid the following: Foods with a lot of added sugars. These include foods that contain brown sugar, corn sweetener, corn syrup, dextrose, fructose, glucose, high-fructose corn syrup, honey, invert sugar, lactose, malt syrup, maltose, molasses, raw sugar, sucrose, trehalose, or turbinado sugar. Do not eat more than the following amounts of added sugar per day: 6 teaspoons (25 g) for women. 9 teaspoons (38 g) for men. Foods that contain processed or refined starches and grains. Refined grain products, such as white flour, degermed cornmeal, white bread, and white rice. Shopping Choose nutrient-rich snacks, such as vegetables, whole fruits, and nuts. Avoid high-calorie and high-sugar snacks, such as potato chips, fruit  snacks, and candy. Use oil-based dressings and spreads on foods instead of solid fats such as butter, stick margarine, or cream cheese. Limit pre-made sauces, mixes, and "instant" products such as flavored rice, instant noodles, and ready-made pasta. Try more plant-protein sources, such as tofu, tempeh, black beans, edamame, lentils, nuts, and seeds. Explore eating plans such as the Mediterranean diet or vegetarian diet. Cooking Use oil to saut or stir-fry foods instead of solid fats such as butter, stick margarine, or lard. Try baking, boiling, grilling, or broiling instead of frying. Remove the fatty part of meats before cooking. Steam vegetables in water or broth. Meal planning  At meals, imagine dividing your plate into fourths: One-half of your plate is fruits and vegetables. One-fourth of your plate is whole grains. One-fourth of your plate is protein, especially lean meats, poultry, eggs, tofu, beans, or nuts. Include low-fat dairy as part of your daily diet. Lifestyle Choose healthy options in all settings, including home, work, school, restaurants, or stores. Prepare your food safely: Wash your hands after handling raw meats. Keep food preparation surfaces clean by regularly washing with hot, soapy water. Keep raw meats separate from ready-to-eat foods, such as fruits and vegetables. Cook seafood, meat, poultry, and eggs to the recommended internal temperature. Store foods at safe temperatures. In general: Keep cold foods at 38F (4.4C) or below. Keep hot foods at 138F (60C) or above. Keep your freezer at [facility] (-17.8C) or below. Foods are no longer safe to eat when they have been between the temperatures of 40-138F (4.4-60C) for more than 2 hours. What foods should I eat? Fruits Aim to eat 2 cup-equivalents  of fresh, canned (in natural juice), or frozen fruits each day. Examples of 1 cup-equivalent of fruit include 1 small apple, 8 large strawberries, 1 cup canned  fruit,  cup dried fruit, or 1 cup 100% juice. Vegetables Aim to eat 2-3 cup-equivalents of fresh and frozen vegetables each day, including different varieties and colors. Examples of 1 cup-equivalent of vegetables include 2 medium carrots, 2 cups raw, leafy greens, 1 cup chopped vegetable (raw or cooked), or 1 medium baked potato. Grains Aim to eat 6 ounce-equivalents of whole grains each day. Examples of 1 ounce-equivalent of grains include 1 slice of bread, 1 cup ready-to-eat cereal, 3 cups popcorn, or  cup cooked rice, pasta, or cereal. Meats and other proteins Aim to eat 5-6 ounce-equivalents of protein each day. Examples of 1 ounce-equivalent of protein include 1 egg, 1/2 cup nuts or seeds, or 1 tablespoon (16 g) peanut butter. A cut of meat or fish that is the size of a deck of cards is about 3-4 ounce-equivalents. Of the protein you eat each week, try to have at least 8 ounces come from seafood. This includes salmon, trout, herring, and anchovies. Dairy Aim to eat 3 cup-equivalents of fat-free or low-fat dairy each day. Examples of 1 cup-equivalent of dairy include 1 cup (240 mL) milk, 8 ounces (250 g) yogurt, 1 ounces (44 g) natural cheese, or 1 cup (240 mL) fortified soy milk. Fats and oils Aim for about 5 teaspoons (21 g) per day. Choose monounsaturated fats, such as canola and olive oils, avocados, peanut butter, and most nuts, or polyunsaturated fats, such as sunflower, corn, and soybean oils, walnuts, pine nuts, sesame seeds, sunflower seeds, and flaxseed. Beverages Aim for six 8-oz glasses of water per day. Limit coffee to three to five 8-oz cups per day. Limit caffeinated beverages that have added calories, such as soda and energy drinks. Limit alcohol intake to no more than 1 drink a day for nonpregnant women and 2 drinks a day for men. One drink equals 12 oz of beer (355 mL), 5 oz of wine (148 mL), or 1 oz of hard liquor (44 mL). Seasoning and other foods Avoid adding excess  amounts of salt to your foods. Try flavoring foods with herbs and spices instead of salt. Avoid adding sugar to foods. Try using oil-based dressings, sauces, and spreads instead of solid fats. This information is based on general [place] nutrition guidelines. For more information, visit [employer]. Exact amounts may vary based on your nutrition needs. Summary A healthy eating plan may help you to maintain a healthy weight, reduce the risk of chronic diseases, and stay active throughout your life. Plan your meals. Make sure you eat the right portions of a variety of nutrient-rich foods. Try baking, boiling, grilling, or broiling instead of frying. Choose healthy options in all settings, including home, work, school, restaurants, or stores. This information is not intended to replace advice given to you by your health care provider. Make sure you discuss any questions you have with your health care provider. Document Revised: 03/12/2022 Document Reviewed: 05/28/2021 Elsevier Patient Education  [place].

## 2022-10-29 NOTE — Progress Notes (Addendum)
Complete Physical  Assessment and Plan:  Candice [name] was seen today for annual exam.  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 Stay well hydrated. Avoid high salt foods. Avoid NSAIDS. Keep BP and BG well controlled.   Take medications as prescribed. Remain active and exercise as tolerated daily. Maintain weight.  Continue to monitor. Check CMP/GFR/Microablumin   Type 2 diabetes mellitus with other specified complication, without long-term current use of insulin (HCC) Discussed increase Semaglutide dose during next refill. Education: Reviewed 'ABCs' of diabetes management  Discussed goals to be met and/or maintained include A1C (<7) Blood pressure (<130/80) Cholesterol (LDL <70) Continue Eye Exam yearly  Continue Dental Exam Q6 mo Discussed dietary recommendations Discussed Physical Activity recommendations Check A1C   Hyperlipidemia associated with type 2 diabetes mellitus (HCC) Discussed lifestyle modifications. Recommended diet heavy in fruits and veggies, omega 3's. Decrease consumption of animal meats, cheeses, and dairy products. Remain active and exercise as tolerated. Continue to monitor. Check lipids/TSH  Vitamin D deficiency Continue supplement Monitor levels  Medication management All medications discussed and reviewed in full. All questions and concerns regarding medications addressed.    Obesity (BMI 30.0-34.9) Discussed appropriate BMI Diet modification. Physical activity. Encouraged/praised to build confidence.   Polycythemia Continue to monitor CBC, ferrtitin, TIBC.  Anxiety Continue Celexa Reviewed relaxation techniques.  Sleep hygiene. Recommended Cognitive Behavioral Therapy  (CBT). Recommended mindfulness meditation and exercise.   Encouraged personality growth wand development through coping techniques and problem-solving skills. Limit/Decrease/Monitor drug/alcohol intake.     Labile hypertension Discussed DASH (Dietary Approaches to Stop Hypertension) DASH diet is lower in sodium than a typical American diet. Cut back on foods that are high in saturated fat, cholesterol, and trans fats. Eat more whole-grain foods, fish, poultry, and nuts Remain active and exercise as tolerated daily.  Monitor BP at home-Call if greater than 130/80.  Check CMP/CBC   B12 def Continue supplement Monitor levels  Family history of abdominal aortic aneurysm (AAA) Monitor; control BP, routine screening,  Complete AAA [place]  Former smoker (20 pack year hx, quit 2019) Lung cancer screening with low dose CT discussed as recommended by guidelines based on age, number of pack year history.  Discussed risks of screening including but not limited to false positives on xray, further testing or consultation with specialist, and possible false negative CT as well. Understanding expressed and wishes to proceed with CT testing.  Order placed  Screening for thyroid disorder Check TSH  Screening for hematuria or proteinuria Check Microalbumin/UA  Screening for lung cancer Annual  Lung CT Screening  Screening for colon cancer Cologuard ordered  Middle ear effusion Suggested OTC ear wash Continue to monitor  Orders Placed This Encounter  Procedures   CT CHEST LUNG CA SCREEN LOW DOSE W/O CM    Standing Status:   Future    Standing Expiration Date:   10/30/2023    Order Specific Question:   Preferred Imaging Location?    Answer:   [id number]   Cologuard   CBC with Differential/Platelet   COMPLETE METABOLIC PANEL WITH GFR    Standing Status:   Future    Standing Expiration Date:   10/30/2023   Lipid panel    Standing Status:   Future  Standing Expiration Date:    10/30/2023   TSH    Standing Status:   Future    Standing Expiration Date:   10/30/2023   Hemoglobin A1c    Standing Status:   Future    Standing Expiration Date:   10/30/2023   Insulin, random    Standing Status:   Future    Standing Expiration Date:   10/30/2023   VITAMIN D 25 Hydroxy (Vit-D Deficiency, Fractures)    Standing Status:   Future    Standing Expiration Date:   10/30/2023   Urinalysis, Routine w reflex microscopic    Standing Status:   Future    Standing Expiration Date:   10/30/2023   Microalbumin / creatinine urine ratio    Standing Status:   Future    Standing Expiration Date:   10/30/2023   Vitamin B12    Standing Status:   Future    Standing Expiration Date:   10/30/2023   [place], retroperitnl abd,  ltd   EKG 12-Lead   Patient wishes to RTC to have labs obtained when fasting.  She will call to make an appointment.  Future orders placed   Notify office for further evaluation and treatment, questions or concerns if any reported s/s fail to improve.   The patient was advised to call back or seek an in-person evaluation if any symptoms worsen or if the condition fails to improve as anticipated.   Further disposition pending results of labs. Discussed med's effects and SE's.    I discussed the assessment and treatment plan with the patient. The patient was provided an opportunity to ask questions and all were answered. The patient agreed with the plan and demonstrated an understanding of the instructions.  Discussed med's effects and SE's. Screening labs and tests as requested with regular follow-up as recommended.  I provided 40 minutes of face-to-face time during this encounter including counseling, chart review, and critical decision making was preformed.   Future Appointments  Date Time Provider Department Center  11/02/2023  3:00 PM [name], NP [facility] None     HPI  67 y.o. female  presents for a complete physical and follow up for has Former smoker  (20 pack year, quit 2019); Diabetes mellitus (HCC); Hyperlipidemia associated with type 2 diabetes mellitus (HCC); Generalized anxiety disorder; Vitamin D deficiency; Medication management; Obesity (BMI 30.0-34.9); CKD stage 2 due to type 2 diabetes mellitus (HCC); Polycythemia; and Vitamin B 12 deficiency on their problem list.   Overall she reports feeling well.   She is married, 1 daughter, 1 grandson and new granddaughter. She enjoys spending time with them.  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 She keeps grandchildren in the afternoon.   She follows with GYN, PA Josph Macho at [facility], however, not longer following.  Had Pap in 2021, normal and done.   She is on OTC antihistamine and Singulair for seasonable allergies. Has had lingering fullness in ears, muffled hearing.  Feels like swimmer's ear.   She is off of celexa, reports has been doing failry off of med, wants to star off for now.   She is a former smoker, 20 pack year hx, quit 2019, discussed low dose CT.   BMI is Body mass index is 29.25 kg/m., she has been working on diet and exercise. Wt Readings from Last 3 Encounters:  10/29/22 175 lb 12.8 oz (79.7 kg)  09/08/22 174 lb (78.9 kg)  06/18/22 175 lb 3.2 oz (79.5 kg)  Her blood pressure has been controlled at home, today their BP is BP: 122/60 She does not workout. She denies chest pain, shortness of breath, dizziness.   She is on cholesterol medication (rosuvastatin 40 mg daily)  and denies myalgias. Her cholesterol is not at goal. The cholesterol last visit was:   Lab Results  Component Value Date   CHOL 153 06/18/2022   HDL 43 (L) 06/18/2022   LDLCALC 78 06/18/2022   TRIG 220 (H) 06/18/2022   CHOLHDL 3.6 06/18/2022   She has been working on diet and exercise for T2DM ([name] 25 mg, trulicity 3 mg weekly, glipizide 10 mg AM, 5 mg with lunch and 1 tab with dinner, hx of metformin intolerance), was following with Dr. Elvera Lennox as  well, last seen 11/2019, patient pref not to go back, she is on bASA, she is not on ACE/ARB (low BPs, patient adamantly declines) and denies hypoglycemia , increased appetite, nausea, paresthesia of the feet, polydipsia and polyuria.  She does check fasting glucose, ranging 150-180s. She is now on Semaglutide.  Last A1C in the office was:  Lab Results  Component Value Date   HGBA1C 8.0 (H) 06/18/2022   Last GFR: Lab Results  Component Value Date   GFRNONAA 91 04/17/2021   Patient is not on Vitamin D supplement, forgets to take, has been recommended 4000 IU.  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 No immature cells, adequate platelets, Erythrocytosis with unremarkable RBC morphology.     Latest Ref Rng & Units 06/18/2022   12:52 PM 03/12/2022   11:51 AM 10/23/2021   11:24 AM  CBC  WBC 3.8 - 10.8 Thousand/uL 7.1  7.1  6.4   Hemoglobin 11.7 - 15.5 g/dL 23.5  57.3  22.0   Hematocrit 35.0 - 45.0 % 48.0  47.9  48.2   Platelets 140 - 400 Thousand/uL 230  229  234    Lab Results  Component Value Date   IRON 126 06/27/2020   TIBC 352 08/15/2019   FERRITIN 179 06/27/2020   She admits hasn't been taking supplement, will start sublingual Lab Results  Component Value Date   VITAMINB12 279 06/18/2022      Current Medications:  Current Outpatient Medications on File Prior to Visit  Medication Sig Dispense Refill   albuterol (PROVENTIL) (2.5 MG/3ML) 0.083% nebulizer solution Take 3 mLs (2.5 mg total) by nebulization every 6 (six) hours as needed for wheezing or shortness of breath. 150 mL 1   aspirin EC 81 MG tablet Take 81 mg by mouth daily.     Cholecalciferol (VITAMIN D3) 50 MCG (2000 UT) TABS Take 5,000 Units by mouth daily. 30 tablet     citalopram (CELEXA) 40 MG tablet Take 1 tablet by mouth Daily for Mood 90 tablet 3   Cyanocobalamin (B-12) 1000 MCG SUBL Place 1 tablet under the tongue daily. 30 tablet 0   empagliflozin ([name]) 25 MG TABS tablet Take 1 tablet (25 mg total) by mouth daily. 90 tablet 3   glipiZIDE (GLUCOTROL) 5 MG tablet TAKE 2 TABLETS BEFORE BREAKFAST AND 1-2 TABLETS BEFORE MIDNIGHT. 360 tablet 3   meclizine (ANTIVERT) 25 MG tablet 1/2-1 pill up to 3 times daily for motion sickness/dizziness 30 tablet 0   montelukast (SINGULAIR) 10 MG tablet TAKE ONE  TABLET AT BEDTIME. 90 tablet 3   OVER THE COUNTER MEDICATION Takes an OTC Allertec     rosuvastatin (CRESTOR) 40 MG tablet TAKE 1 TABLET ONCE A DAY TO CONTROL CHOLESTEROL. 90 tablet 3   Semaglutide,0.25 or 0.5MG /DOS, (OZEMPIC, 0.25 OR 0.5 MG/DOSE,) 2 MG/3ML SOPN START BY INJECTING 0.25 MG INTO THE SKIN OF THE STOMACH ONCE WEEKLY. IF DOING WELL INCREASE TO 0.5 MG IN 2-4 WEEKS 3 mL 3   doxycycline (VIBRA-TABS) 100 MG tablet Take 1 tablet (100 mg total) by mouth 2 (two) times daily. 14 tablet 0   No current facility-administered medications on file prior to visit.   Allergies:  Allergies  Allergen Reactions   Levaquin [Levofloxacin] Hives and Other (See Comments)    Tingling sensation to mouth, near LOC, blurred vision,     Iodine     Unknown reaction    Levofloxacin In D5w Hives    Hives   Metformin And Related Diarrhea   Shellfish Allergy     Unknown reaction    Amoxicillin Rash   Eggs Or Egg-Derived Products     Low, by skin test only, local itching when injected    Kombiglyze [Saxagliptin-Metformin Er] Other (See Comments)    Bloating   Latex Itching and Rash   Penicillins Rash   Medical History:  She has Former smoker (20 pack year, quit 2019); Diabetes mellitus ([place]); Hyperlipidemia associated with type 2 diabetes mellitus ([place]); Generalized anxiety disorder; Vitamin D deficiency; Medication management; Obesity (BMI 30.0-34.9); CKD stage 2  due to type 2 diabetes mellitus ([place]); Polycythemia; and Vitamin B 12 deficiency on their problem list. Health Maintenance:   Immunization History  Administered Date(s) Administered   Influenza Inj Mdck Quad With Preservative 08/15/2019, 08/29/2020   Influenza,inj,Quad PF,6+ Mos 10/23/2021   Janssen (J&J) SARS-COV-2 Vaccination 12/17/2020   Tdap 02/14/2013    Tetanus: 2014 Pneumovax: ? 2014, reports has had, repeat age 43 Prevnar 71:  Flu vaccine: 10/2021 Shingrix: declines for now, check with insurance  Covid 19: did get J&J, will send info  LMP: No LMP recorded. Patient is postmenopausal. Pap: 07/2020, gets annually at GYN MGM: 07/2022 at GYN  Colonoscopy: 12/2010, due 2022, Dr. Earlean Shawl Cologuard ordered EGD: n/a  Last Dental Exam: Dr. Mina Marble at [facility], last 2023, overdue to schedule, reminded Last Eye Exam: Dr. Posey Pronto, [facility], last 09/2021, will call to schedule, report requested  Patient Care Team: Unk Pinto, MD as PCP - General (Internal Medicine) Earnstine Regal, PA-C as Physician Assistant (Obstetrics and Gynecology)  Surgical History:  She has a past surgical history that includes Cesarean section; Tonsillectomy; Appendectomy; and Cholecystectomy. Family History:  Herfamily history includes AAA (abdominal aortic aneurysm) in her mother; Brain cancer (age of onset: 75) in her maternal grandmother; Cancer in her sister; Diabetes in her sister; Heart attack in her maternal grandfather; Heart attack (age of onset: 43) in her brother; Heart disease in her sister; Hyperlipidemia in her mother. Social History:  She reports that she quit smoking about 5 years ago. Her smoking use included cigarettes. She started smoking about 45 years ago. She has a 20.00 pack-year smoking history. She has never used smokeless tobacco. She reports that she does not drink alcohol and does not use drugs.  Review of Systems: Review of Systems  Constitutional:  Negative for  malaise/fatigue and weight loss.  HENT:  Negative for hearing loss and tinnitus.   Eyes:  Negative for blurred vision and double vision.  Respiratory:  Negative for cough,  sputum production, shortness of breath and wheezing.   Cardiovascular:  Negative for chest pain, palpitations, orthopnea, claudication, leg swelling and PND.  Gastrointestinal:  Negative for abdominal pain, blood in stool, constipation, diarrhea, heartburn, melena, nausea and vomiting.  Genitourinary: Negative.   Musculoskeletal:  Negative for falls, joint pain and myalgias.  Skin:  Negative for rash.  Neurological:  Negative for dizziness, tingling, sensory change, weakness and headaches.  Endo/Heme/Allergies:  Negative for polydipsia.  Psychiatric/Behavioral: Negative.  Negative for depression, memory loss, substance abuse and suicidal ideas. The patient is not nervous/anxious and does not have insomnia.   All other systems reviewed and are negative.   Physical Exam: Estimated body mass index is 29.25 kg/m as calculated from the following:   Height as of this encounter: 5\' 5"  (1.651 m).   Weight as of this encounter: 175 lb 12.8 oz (79.7 kg). BP 122/60   Pulse 78   Temp (!) 97.3 F (36.3 C)   Ht 5\' 5"  (1.651 m)   Wt 175 lb 12.8 oz (79.7 kg)   SpO2 98%   BMI 29.25 kg/m  General Appearance: Well nourished, in no apparent distress.  Eyes: PERRLA, EOMs, conjunctiva no swelling or erythema, fundal exam deferred to ophth  Sinuses: No Frontal/maxillary tenderness  ENT/Mouth: Ext aud canals clear, normal light reflex with TMs without erythema, bulging. Good dentition. No erythema, swelling, or exudate on post pharynx. Tonsils not swollen or erythematous. Hearing normal.  Neck: Supple, thyroid normal. No bruits  Respiratory: Respiratory effort normal, BS equal bilaterally without rales, rhonchi, wheezing or stridor.  Cardio: RRR without murmurs, rubs or gallops. Brisk peripheral pulses without edema.  Chest: symmetric,  with normal excursions and percussion.  Breasts: Defer to GYN, getting mammogram  Abdomen: Soft, nontender, no guarding, rebound, hernias, masses, or organomegaly.  Lymphatics: Non tender without lymphadenopathy.  Genitourinary: Defer to GYN Musculoskeletal: Full ROM all peripheral extremities,5/5 strength, and normal gait.  Skin: Warm, dry without rashes, lesions, ecchymosis. Neuro: Cranial nerves intact, reflexes equal bilaterally. Normal muscle tone, no cerebellar symptoms. Sensation intact.  Psych: Awake and oriented X 3, normal affect, Insight and Judgment appropriate.   Diabetic foot exam:  Left: Reflexes 2+   Vibratory sensation normal  Proprioception normal  Sharp/dull discrimination normal  Filament test present Right: Reflexes 2+   Vibratory sensation normal  Proprioception normal  Sharp/dull discrimination normal  Filament test present   EKG: WNL no ST changes.  AAA [place]: WNL   Darrol Jump, NP 4:42 PM  [facility]

## 2022-11-12 ENCOUNTER — Other Ambulatory Visit: Payer: Self-pay

## 2022-11-12 ENCOUNTER — Other Ambulatory Visit: Payer: Self-pay | Admitting: Nurse Practitioner

## 2022-11-12 ENCOUNTER — Telehealth: Payer: Self-pay

## 2022-11-12 DIAGNOSIS — D751 Secondary polycythemia: Secondary | ICD-10-CM

## 2022-11-12 DIAGNOSIS — E669 Obesity, unspecified: Secondary | ICD-10-CM

## 2022-11-12 DIAGNOSIS — E1169 Type 2 diabetes mellitus with other specified complication: Secondary | ICD-10-CM

## 2022-11-12 DIAGNOSIS — R0989 Other specified symptoms and signs involving the circulatory and respiratory systems: Secondary | ICD-10-CM

## 2022-11-12 DIAGNOSIS — E559 Vitamin D deficiency, unspecified: Secondary | ICD-10-CM

## 2022-11-12 DIAGNOSIS — E1122 Type 2 diabetes mellitus with diabetic chronic kidney disease: Secondary | ICD-10-CM

## 2022-11-12 DIAGNOSIS — Z79899 Other long term (current) drug therapy: Secondary | ICD-10-CM

## 2022-11-12 DIAGNOSIS — Z1329 Encounter for screening for other suspected endocrine disorder: Secondary | ICD-10-CM

## 2022-11-12 DIAGNOSIS — E538 Deficiency of other specified B group vitamins: Secondary | ICD-10-CM

## 2022-11-12 DIAGNOSIS — Z0001 Encounter for general adult medical examination with abnormal findings: Secondary | ICD-10-CM

## 2022-11-12 DIAGNOSIS — E119 Type 2 diabetes mellitus without complications: Secondary | ICD-10-CM

## 2022-11-12 DIAGNOSIS — Z1389 Encounter for screening for other disorder: Secondary | ICD-10-CM

## 2022-11-12 MED ORDER — SEMAGLUTIDE (1 MG/DOSE) 4 MG/3ML ~~LOC~~ SOPN: 1.0000 mg | PEN_INJECTOR | SUBCUTANEOUS | 2 refills | Status: DC

## 2022-11-12 MED ORDER — MECLIZINE HCL 25 MG PO TABS: ORAL_TABLET | ORAL | 0 refills | Status: AC

## 2022-11-12 NOTE — Telephone Encounter (Signed)
Patient states that during her last visit with you, there was discussion on increasing the dosage of the Ozempic. Please send in to the pharmacy at [facility] [place].

## 2022-11-13 LAB — COMPLETE METABOLIC PANEL WITH GFR
AG Ratio: 1.8 (calc) (ref 1.0–2.5)
ALT: 18 U/L (ref 6–29)
AST: 16 U/L (ref 10–35)
Albumin: 4.8 g/dL (ref 3.6–5.1)
Alkaline phosphatase (APISO): 94 U/L (ref 37–153)
BUN: 14 mg/dL (ref 7–25)
CO2: 26 mmol/L (ref 20–32)
Calcium: 9.6 mg/dL (ref 8.6–10.4)
Chloride: 104 mmol/L (ref 98–110)
Creat: 0.67 mg/dL (ref 0.50–1.05)
Globulin: 2.6 g/dL (calc) (ref 1.9–3.7)
Glucose, Bld: 168 mg/dL — ABNORMAL HIGH (ref 65–99)
Potassium: 4.8 mmol/L (ref 3.5–5.3)
Sodium: 140 mmol/L (ref 135–146)
Total Bilirubin: 0.6 mg/dL (ref 0.2–1.2)
Total Protein: 7.4 g/dL (ref 6.1–8.1)
eGFR: 98 mL/min/{1.73_m2} (ref >=60)

## 2022-11-13 LAB — CBC WITH DIFFERENTIAL/PLATELET
Absolute Monocytes: 530 cells/uL (ref 200–950)
Basophils Absolute: 48 cells/uL (ref 0–200)
Basophils Relative: 0.7 %
Eosinophils Absolute: 190 cells/uL (ref 15–500)
Eosinophils Relative: 2.8 %
HCT: 48.9 % — ABNORMAL HIGH (ref 35.0–45.0)
Hemoglobin: 16.7 g/dL — ABNORMAL HIGH (ref 11.7–15.5)
Lymphs Abs: 1999 cells/uL (ref 850–3900)
MCH: 31.7 pg (ref 27.0–33.0)
MCHC: 34.2 g/dL (ref 32.0–36.0)
MCV: 93 fL (ref 80.0–100.0)
MPV: 10.7 fL (ref 7.5–12.5)
Monocytes Relative: 7.8 %
Neutro Abs: 4032 cells/uL (ref 1500–7800)
Neutrophils Relative %: 59.3 %
Platelets: 221 10*3/uL (ref 140–400)
RBC: 5.26 10*6/uL — ABNORMAL HIGH (ref 3.80–5.10)
RDW: 12.3 % (ref 11.0–15.0)
Total Lymphocyte: 29.4 %
WBC: 6.8 10*3/uL (ref 3.8–10.8)

## 2022-11-13 LAB — URINALYSIS, ROUTINE W REFLEX MICROSCOPIC
Bilirubin Urine: NEGATIVE
Hgb urine dipstick: NEGATIVE
Ketones, ur: NEGATIVE
Leukocytes,Ua: NEGATIVE
Nitrite: NEGATIVE
Protein, ur: NEGATIVE
Specific Gravity, Urine: 1.038 — ABNORMAL HIGH (ref 1.001–1.035)
pH: <=5 (ref 5.0–8.0)

## 2022-11-13 LAB — HEMOGLOBIN A1C
Hgb A1c MFr Bld: 8.6 % of total Hgb — ABNORMAL HIGH (ref <5.7)
Mean Plasma Glucose: 200 mg/dL
eAG (mmol/L): 11.1 mmol/L

## 2022-11-13 LAB — LIPID PANEL
Cholesterol: 144 mg/dL (ref <200)
HDL: 44 mg/dL — ABNORMAL LOW (ref >=50)
LDL Cholesterol (Calc): 71 mg/dL (calc)
Non-HDL Cholesterol (Calc): 100 mg/dL (calc) (ref <130)
Total CHOL/HDL Ratio: 3.3 (calc) (ref <5.0)
Triglycerides: 193 mg/dL — ABNORMAL HIGH (ref <150)

## 2022-11-13 LAB — MICROALBUMIN / CREATININE URINE RATIO
Creatinine, Urine: 52 mg/dL (ref 20–275)
Microalb Creat Ratio: 10 mcg/mg creat (ref <30)
Microalb, Ur: 0.5 mg/dL

## 2022-11-13 LAB — TSH: TSH: 1.96 mIU/L (ref 0.40–4.50)

## 2022-11-13 LAB — INSULIN, RANDOM: Insulin: 7.9 u[IU]/mL

## 2022-11-13 LAB — VITAMIN D 25 HYDROXY (VIT D DEFICIENCY, FRACTURES): Vit D, 25-Hydroxy: 43 ng/mL (ref 30–100)

## 2022-11-13 LAB — VITAMIN B12: Vitamin B-12: 598 pg/mL (ref 200–1100)

## 2022-12-13 LAB — COLOGUARD: COLOGUARD: POSITIVE — AB

## 2022-12-15 ENCOUNTER — Other Ambulatory Visit: Payer: Self-pay | Admitting: Nurse Practitioner

## 2022-12-15 ENCOUNTER — Encounter: Payer: Self-pay | Admitting: Internal Medicine

## 2022-12-15 ENCOUNTER — Ambulatory Visit: Payer: 59 | Admitting: Internal Medicine

## 2022-12-15 VITALS — BP 110/60 | HR 84 | Temp 98.3°F | Ht 65.0 in | Wt 174.0 lb

## 2022-12-15 DIAGNOSIS — J069 Acute upper respiratory infection, unspecified: Secondary | ICD-10-CM

## 2022-12-15 DIAGNOSIS — J0100 Acute maxillary sinusitis, unspecified: Secondary | ICD-10-CM | POA: Diagnosis not present

## 2022-12-15 DIAGNOSIS — E119 Type 2 diabetes mellitus without complications: Secondary | ICD-10-CM | POA: Diagnosis not present

## 2022-12-15 DIAGNOSIS — R195 Other fecal abnormalities: Secondary | ICD-10-CM

## 2022-12-15 MED ORDER — DOXYCYCLINE HYCLATE 100 MG PO TABS: 100.0000 mg | ORAL_TABLET | Freq: Two times a day (BID) | ORAL | 0 refills | Status: DC

## 2022-12-15 MED ORDER — FLUCONAZOLE 150 MG PO TABS: 150.0000 mg | ORAL_TABLET | Freq: Once | ORAL | 0 refills | Status: AC

## 2022-12-15 NOTE — Patient Instructions (Signed)
Doxycycline 100 mg twice daily for 10 days.  Diflucan 150 mg tablets to take 1 time only if develops Candida vaginitis while on antibiotics.  Rest and stay well-hydrated.

## 2022-12-15 NOTE — Progress Notes (Signed)
   Subjective:    Patient ID: Candice [name], female    DOB: May 18, 1956, 67 y.o.   MRN: [id number]  HPI 67 year old Female seen today for an acute visit. This past weekend, developed upper respiratory congestion and hoarseness.  Patient has a history of Type 2 Diabetes mellitus.  She is on semaglutide 1 mg injected into the skin once a week.  In December, she had COVID 19 virus infection.  Denies fever or shaking chills.  No significant cough.  Symptoms are mostly upper respiratory/head congestion.   Vaccine history reviewed.  Review of Systems.  See above no nausea vomiting or diarrhea.  No myalgias.  Has fatigue.     Objective:   Physical Exam Temperature 98.3 degrees ,blood pressure 110/60, pulse 84 regular: Temperature 98.3 degrees by ear thermometer: Pulse oximetry 98% on room air: Weight 174 pounds BMI 28.96  Skin: Warm and dry.  She sounds nasally congested.  Both TMs are very slightly full but not radiated.  Pharynx slightly injected without exudate.  Neck is supple.  Chest completely clear to auscultation without rales or wheezing.     Assessment & Plan:   Acute maxillary sinusitis  Type 2 diabetes mellitus  Plan: Doxycycline 100 mg twice daily for 10 days.  Diflucan 150 mg tablet to take 1 time only if develops Candida vaginitis while on antibiotics.  Rest and stay well-hydrated.

## 2023-01-10 ENCOUNTER — Other Ambulatory Visit: Payer: Self-pay | Admitting: Nurse Practitioner

## 2023-01-10 DIAGNOSIS — E1169 Type 2 diabetes mellitus with other specified complication: Secondary | ICD-10-CM

## 2023-02-11 ENCOUNTER — Encounter: Payer: Self-pay | Admitting: Nurse Practitioner

## 2023-02-11 ENCOUNTER — Ambulatory Visit: Payer: 59 | Admitting: Nurse Practitioner

## 2023-02-11 VITALS — BP 102/60 | HR 81 | Temp 97.8°F | Ht 65.0 in | Wt 174.2 lb

## 2023-02-11 DIAGNOSIS — E1169 Type 2 diabetes mellitus with other specified complication: Secondary | ICD-10-CM

## 2023-02-11 DIAGNOSIS — E1122 Type 2 diabetes mellitus with diabetic chronic kidney disease: Secondary | ICD-10-CM

## 2023-02-11 DIAGNOSIS — N182 Chronic kidney disease, stage 2 (mild): Secondary | ICD-10-CM

## 2023-02-11 DIAGNOSIS — E669 Obesity, unspecified: Secondary | ICD-10-CM

## 2023-02-11 DIAGNOSIS — E119 Type 2 diabetes mellitus without complications: Secondary | ICD-10-CM | POA: Diagnosis not present

## 2023-02-11 DIAGNOSIS — E559 Vitamin D deficiency, unspecified: Secondary | ICD-10-CM

## 2023-02-11 DIAGNOSIS — E785 Hyperlipidemia, unspecified: Secondary | ICD-10-CM

## 2023-02-11 DIAGNOSIS — Z87891 Personal history of nicotine dependence: Secondary | ICD-10-CM

## 2023-02-11 DIAGNOSIS — F411 Generalized anxiety disorder: Secondary | ICD-10-CM

## 2023-02-11 DIAGNOSIS — Z79899 Other long term (current) drug therapy: Secondary | ICD-10-CM

## 2023-02-11 DIAGNOSIS — R0989 Other specified symptoms and signs involving the circulatory and respiratory systems: Secondary | ICD-10-CM

## 2023-02-11 DIAGNOSIS — Z8249 Family history of ischemic heart disease and other diseases of the circulatory system: Secondary | ICD-10-CM

## 2023-02-11 DIAGNOSIS — D751 Secondary polycythemia: Secondary | ICD-10-CM

## 2023-02-11 DIAGNOSIS — E66811 Obesity, class 1: Secondary | ICD-10-CM

## 2023-02-11 DIAGNOSIS — E538 Deficiency of other specified B group vitamins: Secondary | ICD-10-CM

## 2023-02-11 DIAGNOSIS — B3731 Acute candidiasis of vulva and vagina: Secondary | ICD-10-CM

## 2023-02-11 MED ORDER — FLUCONAZOLE 150 MG PO TABS: ORAL_TABLET | ORAL | 0 refills | Status: DC

## 2023-02-11 MED ORDER — SEMAGLUTIDE (1 MG/DOSE) 4 MG/3ML ~~LOC~~ SOPN: 1.0000 mg | PEN_INJECTOR | SUBCUTANEOUS | 2 refills | Status: DC

## 2023-02-11 NOTE — Progress Notes (Signed)
Follow Up  Assessment and Plan:  Candice [name] was seen today for a general follow up.  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 Stay well hydrated. Avoid high salt foods. Avoid NSAIDS. Keep BP and BG well controlled.   Take medications as prescribed. Remain active and exercise as tolerated daily. Maintain weight.  Continue to monitor. Check CMP/GFR/Microablumin   Type 2 diabetes mellitus with other specified complication, without long-term current use of insulin (HCC) Discussed increase Semaglutide dose during next refill. Education: Reviewed 'ABCs' of diabetes management  Discussed goals to be met and/or maintained include A1C (<7) Blood pressure (<130/80) Cholesterol (LDL <70) Continue Eye Exam yearly  Continue Dental Exam Q6 mo Discussed dietary recommendations Discussed Physical Activity recommendations Check A1C   Hyperlipidemia associated with type 2 diabetes mellitus (HCC) Discussed lifestyle modifications. Recommended diet heavy in fruits and veggies, omega 3's. Decrease consumption of animal meats, cheeses, and dairy products. Remain active and exercise as tolerated. Continue to monitor. Check lipids/TSH  Vitamin D deficiency Continue supplement Monitor levels  Medication management All medications discussed and reviewed in full. All questions and concerns regarding medications addressed.    Obesity (BMI 30.0-34.9) Discussed appropriate BMI Diet modification. Physical activity. Encouraged/praised to build confidence.   Polycythemia Continue to monitor CBC, ferrtitin, TIBC.  Anxiety Continue Celexa Reviewed relaxation techniques.  Sleep hygiene. Recommended Cognitive Behavioral Therapy (CBT). Recommended mindfulness meditation and exercise.   Encouraged personality growth wand development through coping techniques and problem-solving  skills. Limit/Decrease/Monitor drug/alcohol intake.     Labile hypertension Discussed DASH (Dietary Approaches to Stop Hypertension) DASH diet is lower in sodium than a typical American diet. Cut back on foods that are high in saturated fat, cholesterol, and trans fats. Eat more whole-grain foods, fish, poultry, and nuts Remain active and exercise as tolerated daily.  Monitor BP at home-Call if greater than 130/80.  Check CMP/CBC   B12 def Continue supplement Monitor levels  Family history of abdominal aortic aneurysm (AAA) Monitor; control BP, routine screening,  Complete AAA [place]  Former smoker (20 pack year hx, quit 2019) Lung cancer screening with low dose CT discussed as recommended by guidelines based on age, number of pack year history.  Discussed risks of screening including but not limited to false positives on xray, further testing or consultation with specialist, and possible false negative CT as well. Understanding expressed and wishes to proceed with CT testing.  Order placed  Vaginal candida Start Diflucan Suggest daily probiotic such as OTC acidophilus or no sugar/low fat yogurt. Possible discontinuation of [name] if s/s fail to improve.  Orders Placed This Encounter  Procedures   CBC with Differential/Platelet   COMPLETE METABOLIC PANEL WITH GFR   Lipid panel   Hemoglobin A1c   VITAMIN D 25 Hydroxy (Vit-D Deficiency, Fractures)   Notify office for further evaluation and treatment, questions or concerns if any reported s/s fail to improve.   The patient was advised to call back or seek an in-person evaluation if any symptoms worsen or if the condition fails to improve as anticipated.   Further disposition pending results of labs. Discussed med's effects and SE's.    I discussed the assessment and treatment plan with the patient. The patient was provided an opportunity to ask questions and all were answered. The patient agreed with the plan and demonstrated  an understanding of the instructions.  Discussed  med's effects and SE's. Screening labs and tests as requested with regular follow-up as recommended.  I provided 25 minutes of face-to-face time during this encounter including counseling, chart review, and critical decision making was preformed.   Future Appointments  Date Time Provider Department Center  03/04/2023 10:00 AM [facility] PREVISIT RM 51 [facility]  03/25/2023  9:00 AM Armbruster, Willaim Rayas, MD [facility]  11/02/2023  3:00 PM Candice Glimpse, NP [facility] None     HPI  67 y.o. female  presents for a general follow up has Former smoker (20 pack year, quit 2019); Diabetes mellitus (HCC); Hyperlipidemia associated with type 2 diabetes mellitus (HCC); Generalized anxiety disorder; Vitamin D deficiency; Medication management; Obesity (BMI 30.0-34.9); CKD stage 2 due to type 2 diabetes mellitus (HCC); Polycythemia; and Vitamin B 12 deficiency on their problem list.   Overall she reports feeling well.   She is married, 1 daughter, 1 grandson and new granddaughter. She enjoys spending time with them.  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 She keeps grandchildren in the afternoon. Having some personal family issues but overall managed. No further concerns at this time.   She follows with GYN, PA Josph Macho at [facility], however, not longer following.  Had Pap in 2021, normal and done.   She is on OTC antihistamine and Singulair for seasonable allergies. Has had lingering fullness in ears, muffled hearing.  Feels like swimmer's ear.   She is off of celexa, reports has been doing failry off of med, wants to star off for now.   She is a former smoker, 20 pack year hx, quit 2019, discussed low dose CT.   BMI is Body mass index is 28.99 kg/m., she has been working on diet and exercise. Wt Readings from Last 3 Encounters:  02/11/23 174 lb 3.2 oz (79 kg)  12/15/22 174 lb (78.9 kg)  10/29/22 175  lb 12.8 oz (79.7 kg)   Her blood pressure has been controlled at home, today their BP is BP: 102/60 She does not workout. She denies chest pain, shortness of breath, dizziness.   She is on cholesterol medication (rosuvastatin 40 mg daily)  and denies myalgias. Her cholesterol is not at goal. The cholesterol last visit was:   Lab Results  Component Value Date   CHOL 144 11/12/2022   HDL 44 (L) 11/12/2022   LDLCALC 71 11/12/2022   TRIG 193 (H) 11/12/2022   CHOLHDL 3.3 11/12/2022   She has been working on diet and exercise for T2DM ([name] 25 mg,Semaglutide 4 mg, glipizide 10 mg AM, 5 mg with lunch and 1 tab with dinner, hx of metformin intolerance), was following with Dr. Elvera Lennox as well, last seen 11/2019, patient pref not to go back, she is on bASA, she is not on ACE/ARB (low BPs, patient adamantly declines) and denies hypoglycemia , increased appetite, nausea, paresthesia of the feet, polydipsia and polyuria.  She does check fasting glucose, ranging 150-180s. She is now on Semaglutide.  Last A1C in the office was:  Lab Results  Component Value Date   HGBA1C 8.6 (H) 11/12/2022   Last GFR: Lab Results  Component Value Date   GFRNONAA 91 04/17/2021   Patient is not on Vitamin D supplement, forgets to take, has been recommended 4000 IU.  Lab Results  Component Value Date   VD25OH 64 11/12/2022     She has intermittent ongoing mild polycythemia for many years; had normal iron/ferritin 06/2020, smear review on 06/27/2020 showed Myeloid population  consists predominantly of mature segmented neutrophils with mild reactive changes. No immature cells, adequate platelets, Erythrocytosis with unremarkable RBC morphology.     Latest Ref Rng & Units 11/12/2022   12:00 AM 06/18/2022   12:52 PM 03/12/2022   11:51 AM  CBC  WBC 3.8 - 10.8 Thousand/uL 6.8  7.1  7.1   Hemoglobin 11.7 - 15.5 g/dL 16.1  09.6  04.5   Hematocrit 35.0 - 45.0 % 48.9  48.0  47.9   Platelets 140 - 400 Thousand/uL 221  230   229    Lab Results  Component Value Date   IRON 126 06/27/2020   TIBC 352 08/15/2019   FERRITIN 179 06/27/2020   Currently on Vit B12 supplement.  Last levels: Lab Results  Component Value Date   VITAMINB12 598 11/12/2022      Current Medications:  Current Outpatient Medications on File Prior to Visit  Medication Sig Dispense Refill   aspirin EC 81 MG tablet Take 81 mg by mouth daily.     Cholecalciferol (VITAMIN D3) 50 MCG (2000 UT) TABS Take 5,000 Units by mouth daily. 30 tablet    citalopram (CELEXA) 40 MG tablet Take 1 tablet by mouth Daily for Mood 90 tablet 3   Cyanocobalamin (B-12) 1000 MCG SUBL Place 1 tablet under the tongue daily. 30 tablet 0   empagliflozin ([name]) 25 MG TABS tablet Take 1 tablet (25 mg total) by mouth daily. 90 tablet 3   glipiZIDE (GLUCOTROL) 5 MG tablet TAKE 2 TABLETS BEFORE BREAKFAST AND 1-2 TABLETS BEFORE MIDNIGHT. 360 tablet 3   meclizine (ANTIVERT) 25 MG tablet 1/2-1 pill up to 3 times daily for motion sickness/dizziness 30 tablet 0   montelukast (SINGULAIR) 10 MG tablet TAKE ONE TABLET AT BEDTIME. 90 tablet 3   OVER THE COUNTER MEDICATION daily. Takes an OTC Allertec     rosuvastatin (CRESTOR) 40 MG tablet TAKE 1 TABLET ONCE A DAY TO CONTROL CHOLESTEROL. 90 tablet 3   doxycycline (VIBRA-TABS) 100 MG tablet Take 1 tablet (100 mg total) by mouth 2 (two) times daily. 20 tablet 0   No current facility-administered medications on file prior to visit.   Allergies:  Allergies  Allergen Reactions   Levaquin [Levofloxacin] Hives and Other (See Comments)    Tingling sensation to mouth, near LOC, blurred vision,     Iodine     Unknown reaction    Levofloxacin In D5w Hives    Hives   Metformin And Related Diarrhea   Shellfish Allergy     Unknown reaction    Amoxicillin Rash   Egg-Derived Products     Low, by skin test only, local itching when injected    Kombiglyze [Saxagliptin-Metformin Er] Other (See Comments)    Bloating   Latex  Itching and Rash   Penicillins Rash   Medical History:  She has Former smoker (20 pack year, quit 2019); Diabetes mellitus (HCC); Hyperlipidemia associated with type 2 diabetes mellitus (HCC); Generalized anxiety disorder; Vitamin D deficiency; Medication management; Obesity (BMI 30.0-34.9); CKD stage 2 due to type 2 diabetes mellitus (HCC); Polycythemia; and Vitamin B 12 deficiency on their problem list. Health Maintenance:   Immunization History  Administered Date(s) Administered   Influenza Inj Mdck Quad With Preservative 08/15/2019, 08/29/2020   Influenza,inj,Quad PF,6+ Mos 10/23/2021   Janssen (J&J) SARS-COV-2 Vaccination 12/17/2020   Tdap 02/14/2013   Patient Care Team: Lucky Cowboy, MD as PCP - General (Internal Medicine) Henreitta Leber, PA-C as Physician Assistant (Obstetrics and Gynecology)  Surgical History:  She has a past surgical history that includes Cesarean section; Tonsillectomy; Appendectomy; and Cholecystectomy. Family History:  Herfamily history includes AAA (abdominal aortic aneurysm) in her mother; Brain cancer (age of onset: 14) in her maternal grandmother; Cancer in her sister; Diabetes in her sister; Heart attack in her maternal grandfather; Heart attack (age of onset: 83) in her brother; Heart disease in her sister; Hyperlipidemia in her mother. Social History:  She reports that she quit smoking about 5 years ago. Her smoking use included cigarettes. She started smoking about 45 years ago. She has a 20.00 pack-year smoking history. She has never used smokeless tobacco. She reports that she does not drink alcohol and does not use drugs.  Review of Systems: Review of Systems  Constitutional:  Negative for malaise/fatigue and weight loss.  HENT:  Negative for hearing loss and tinnitus.   Eyes:  Negative for blurred vision and double vision.  Respiratory:  Negative for cough, sputum production, shortness of breath and wheezing.   Cardiovascular:  Negative for  chest pain, palpitations, orthopnea, claudication, leg swelling and PND.  Gastrointestinal:  Negative for abdominal pain, blood in stool, constipation, diarrhea, heartburn, melena, nausea and vomiting.  Genitourinary: Negative.   Musculoskeletal:  Negative for falls, joint pain and myalgias.  Skin:  Negative for rash.  Neurological:  Negative for dizziness, tingling, sensory change, weakness and headaches.  Endo/Heme/Allergies:  Negative for polydipsia.  Psychiatric/Behavioral: Negative.  Negative for depression, memory loss, substance abuse and suicidal ideas. The patient is not nervous/anxious and does not have insomnia.   All other systems reviewed and are negative.   Physical Exam: Estimated body mass index is 28.99 kg/m as calculated from the following:   Height as of this encounter: 5\' 5"  (1.651 m).   Weight as of this encounter: 174 lb 3.2 oz (79 kg). BP 102/60   Pulse 81   Temp 97.8 F (36.6 C)   Ht 5\' 5"  (1.651 m)   Wt 174 lb 3.2 oz (79 kg)   SpO2 99%   BMI 28.99 kg/m  General Appearance: Well nourished, in no apparent distress.  Eyes: PERRLA, EOMs, conjunctiva no swelling or erythema, fundal exam deferred to ophth  Sinuses: No Frontal/maxillary tenderness  ENT/Mouth: Ext aud canals clear, normal light reflex with TMs without erythema, bulging. Good dentition. No erythema, swelling, or exudate on post pharynx. Tonsils not swollen or erythematous. Hearing normal.  Neck: Supple, thyroid normal. No bruits  Respiratory: Respiratory effort normal, BS equal bilaterally without rales, rhonchi, wheezing or stridor.  Cardio: RRR without murmurs, rubs or gallops. Brisk peripheral pulses without edema.  Chest: symmetric, with normal excursions and percussion.  Breasts: Defer to GYN, getting mammogram  Abdomen: Soft, nontender, no guarding, rebound, hernias, masses, or organomegaly.  Lymphatics: Non tender without lymphadenopathy.  Genitourinary: Defer to GYN Musculoskeletal: Full  ROM all peripheral extremities,5/5 strength, and normal gait.  Skin: Warm, dry without rashes, lesions, ecchymosis. Neuro: Cranial nerves intact, reflexes equal bilaterally. Normal muscle tone, no cerebellar symptoms. Sensation intact.  Psych: Awake and oriented X 3, normal affect, Insight and Judgment appropriate.    Candice Glimpse, NP 10:27 AM [facility]

## 2023-02-11 NOTE — Patient Instructions (Signed)

## 2023-02-12 LAB — COMPLETE METABOLIC PANEL WITH GFR
AG Ratio: 2 (calc) (ref 1.0–2.5)
ALT: 19 U/L (ref 6–29)
AST: 15 U/L (ref 10–35)
Albumin: 4.8 g/dL (ref 3.6–5.1)
Alkaline phosphatase (APISO): 107 U/L (ref 37–153)
BUN: 13 mg/dL (ref 7–25)
CO2: 26 mmol/L (ref 20–32)
Calcium: 10.1 mg/dL (ref 8.6–10.4)
Chloride: 104 mmol/L (ref 98–110)
Creat: 0.71 mg/dL (ref 0.50–1.05)
Globulin: 2.4 g/dL (calc) (ref 1.9–3.7)
Glucose, Bld: 193 mg/dL — ABNORMAL HIGH (ref 65–99)
Potassium: 5.6 mmol/L — ABNORMAL HIGH (ref 3.5–5.3)
Sodium: 141 mmol/L (ref 135–146)
Total Bilirubin: 0.6 mg/dL (ref 0.2–1.2)
Total Protein: 7.2 g/dL (ref 6.1–8.1)
eGFR: 95 mL/min/{1.73_m2} (ref >=60)

## 2023-02-12 LAB — LIPID PANEL
Cholesterol: 165 mg/dL (ref <200)
HDL: 44 mg/dL — ABNORMAL LOW (ref >=50)
LDL Cholesterol (Calc): 88 mg/dL (calc)
Non-HDL Cholesterol (Calc): 121 mg/dL (calc) (ref <130)
Total CHOL/HDL Ratio: 3.8 (calc) (ref <5.0)
Triglycerides: 248 mg/dL — ABNORMAL HIGH (ref <150)

## 2023-02-12 LAB — HEMOGLOBIN A1C
Hgb A1c MFr Bld: 8.9 % of total Hgb — ABNORMAL HIGH (ref <5.7)
Mean Plasma Glucose: 209 mg/dL
eAG (mmol/L): 11.6 mmol/L

## 2023-02-12 LAB — CBC WITH DIFFERENTIAL/PLATELET
Absolute Monocytes: 553 cells/uL (ref 200–950)
Basophils Absolute: 52 cells/uL (ref 0–200)
Basophils Relative: 0.8 %
Eosinophils Absolute: 98 cells/uL (ref 15–500)
Eosinophils Relative: 1.5 %
HCT: 46.8 % — ABNORMAL HIGH (ref 35.0–45.0)
Hemoglobin: 16 g/dL — ABNORMAL HIGH (ref 11.7–15.5)
Lymphs Abs: 1827 cells/uL (ref 850–3900)
MCH: 31.3 pg (ref 27.0–33.0)
MCHC: 34.2 g/dL (ref 32.0–36.0)
MCV: 91.6 fL (ref 80.0–100.0)
MPV: 10.8 fL (ref 7.5–12.5)
Monocytes Relative: 8.5 %
Neutro Abs: 3972 cells/uL (ref 1500–7800)
Neutrophils Relative %: 61.1 %
Platelets: 214 10*3/uL (ref 140–400)
RBC: 5.11 10*6/uL — ABNORMAL HIGH (ref 3.80–5.10)
RDW: 12.3 % (ref 11.0–15.0)
Total Lymphocyte: 28.1 %
WBC: 6.5 10*3/uL (ref 3.8–10.8)

## 2023-02-12 LAB — VITAMIN D 25 HYDROXY (VIT D DEFICIENCY, FRACTURES): Vit D, 25-Hydroxy: 45 ng/mL (ref 30–100)

## 2023-02-13 ENCOUNTER — Other Ambulatory Visit: Payer: Self-pay | Admitting: Nurse Practitioner

## 2023-02-13 DIAGNOSIS — E875 Hyperkalemia: Secondary | ICD-10-CM

## 2023-02-13 DIAGNOSIS — Z79899 Other long term (current) drug therapy: Secondary | ICD-10-CM

## 2023-02-16 ENCOUNTER — Ambulatory Visit
Admission: RE | Admit: 2023-02-16 | Discharge: 2023-02-16 | Disposition: A | Discharge status: Home / Self Care | Payer: 59 | Source: Ambulatory Visit | Attending: Internal Medicine | Admitting: Internal Medicine

## 2023-02-16 ENCOUNTER — Encounter: Payer: Self-pay | Admitting: Internal Medicine

## 2023-02-16 ENCOUNTER — Ambulatory Visit: Payer: 59 | Admitting: Internal Medicine

## 2023-02-16 VITALS — BP 106/64 | HR 67 | Temp 97.8°F | Ht 65.0 in | Wt 174.0 lb

## 2023-02-16 DIAGNOSIS — J9801 Acute bronchospasm: Secondary | ICD-10-CM | POA: Diagnosis not present

## 2023-02-16 DIAGNOSIS — Z87891 Personal history of nicotine dependence: Secondary | ICD-10-CM | POA: Diagnosis not present

## 2023-02-16 DIAGNOSIS — J22 Unspecified acute lower respiratory infection: Secondary | ICD-10-CM

## 2023-02-16 DIAGNOSIS — E119 Type 2 diabetes mellitus without complications: Secondary | ICD-10-CM

## 2023-02-16 MED ORDER — BENZONATATE 100 MG PO CAPS: 100.0000 mg | ORAL_CAPSULE | Freq: Three times a day (TID) | ORAL | 0 refills | Status: DC | PRN

## 2023-02-16 MED ORDER — ALBUTEROL SULFATE (2.5 MG/3ML) 0.083% IN NEBU
2.5000 mg | INHALATION_SOLUTION | Freq: Once | RESPIRATORY_TRACT | Status: AC
Start: 2023-02-16 — End: 2023-02-16
  Administered 2023-02-16: 2.5 mg via RESPIRATORY_TRACT

## 2023-02-16 MED ORDER — CEFTRIAXONE SODIUM 1 G IJ SOLR
1.0000 g | Freq: Once | INTRAMUSCULAR | Status: AC
Start: 2023-02-16 — End: 2023-02-16
  Administered 2023-02-16: 1 g via INTRAMUSCULAR

## 2023-02-16 MED ORDER — METHYLPREDNISOLONE ACETATE 80 MG/ML IJ SUSP
80.0000 mg | Freq: Once | INTRAMUSCULAR | Status: AC
Start: 2023-02-16 — End: 2023-02-16
  Administered 2023-02-16: 80 mg via INTRAMUSCULAR

## 2023-02-16 MED ORDER — DOXYCYCLINE HYCLATE 100 MG PO TABS
100.0000 mg | ORAL_TABLET | Freq: Two times a day (BID) | ORAL | 0 refills | Status: DC
Start: 2023-02-16 — End: 2023-03-03

## 2023-02-16 MED ORDER — FLUCONAZOLE 150 MG PO TABS: 150.0000 mg | ORAL_TABLET | Freq: Once | ORAL | 0 refills | Status: AC

## 2023-02-22 NOTE — Progress Notes (Signed)
   Subjective:    Patient ID: Randell Patient, female    DOB: [birth year], 67 y.o.   MRN: [id number]  HPI 67 year old Female seen acutely with cough and congestion. Hx DM and Hgb AIC in early May was 8.9%  Hx hyperlipidemia treated with Crestor. Former smoker. Has grandchildren who may have exposed her with this illness.  Review of Systems Malaise and fatigue with respiratory symptoms. No shaking chills.  Cologard was positive in February and is scheduled for colonoscopy in June with Dr. Adela Lank.     Objective:   Physical Exam   Afebrile. BP 106/64   Skin: warm and dry. No cervical adenopathy. Bilateral rhochi and inspiratory wheezing. Sent for CXR and does not have pneumonia.    Assessment & Plan:   Acute bronchospasm   Acute lower respiratory infection  Hx of smoking  Type 2 DM  Plan: Given albuterol nebulizer treatment in the office.  Chest x-ray obtained and shows no pneumonia.  Was treated with doxycycline 100 mg twice daily for 10 days.  Prescribed Tessalon Perles 100 mg to take 2-3 times daily if needed for cough.  Given Depo-Medrol 80 mg IM in the office to help with acute bronchospasm.  May take Diflucan if needed for Candida vaginitis while on antibiotics.  Also given 1 g IM Rocephin in the office.

## 2023-02-22 NOTE — Patient Instructions (Addendum)
Given 1 g IM Rocephin in the office.  Given a nebulizer treatment in the office for acute bronchospasm.  Chest x-ray obtained and shows no pneumonia.  Given Depo-Medrol 80 mg IM to help with acute bronchospasm and shortness of breath.  Take doxycycline 100 mg twice daily for 10 days.  May take Diflucan if needed for Candida vaginitis while on antibiotics.

## 2023-03-03 ENCOUNTER — Encounter: Payer: Self-pay | Admitting: Internal Medicine

## 2023-03-03 ENCOUNTER — Ambulatory Visit: Payer: 59 | Admitting: Internal Medicine

## 2023-03-03 VITALS — BP 116/68 | HR 78 | Temp 97.6°F | Ht 65.0 in | Wt 174.0 lb

## 2023-03-03 DIAGNOSIS — E119 Type 2 diabetes mellitus without complications: Secondary | ICD-10-CM | POA: Diagnosis not present

## 2023-03-03 DIAGNOSIS — J22 Unspecified acute lower respiratory infection: Secondary | ICD-10-CM | POA: Diagnosis not present

## 2023-03-03 DIAGNOSIS — J029 Acute pharyngitis, unspecified: Secondary | ICD-10-CM

## 2023-03-03 DIAGNOSIS — Z794 Long term (current) use of insulin: Secondary | ICD-10-CM

## 2023-03-03 LAB — POCT RAPID STREP A (OFFICE): Rapid Strep A Screen: NEGATIVE

## 2023-03-03 MED ORDER — DOXYCYCLINE HYCLATE 100 MG PO TABS
100.0000 mg | ORAL_TABLET | Freq: Two times a day (BID) | ORAL | 0 refills | Status: DC
Start: 2023-03-03 — End: 2023-06-10

## 2023-03-03 MED ORDER — FLUCONAZOLE 150 MG PO TABS: 150.0000 mg | ORAL_TABLET | Freq: Once | ORAL | 0 refills | Status: AC

## 2023-03-03 NOTE — Progress Notes (Addendum)
Patient Care Team: Lucky Cowboy, MD as PCP - General (Internal Medicine) Henreitta Leber, PA-C as Physician Assistant (Obstetrics and Gynecology)  Visit Date: 03/03/23  Subjective:    Patient ID: Candice [name] , Female   DOB: 1956/07/27, 67 y.o.    MRN: [id number]   67 y.o. Female presents today for sore throat since the night of 02/28/23. She was at a wedding with children that day. Has had continued cough with discolored sputum since 02/16/23. Denies fever, chills. Has vertigo when turning over on her left side in bed. Congestion never improved since 02/16/23 visit for acute lower respiratory infection. Treated with doxycyline, albuterol nebulizer, Tessalon Perles, Depo.  Past Medical History:  Diagnosis Date   Anxiety    Diabetes mellitus without complication (HCC)    Hx of colonic polyps    Fhx Colon cancer   Hyperlipidemia    Hypocholesteremia    Tobacco abuse      Family History  Problem Relation Age of Onset   AAA (abdominal aortic aneurysm) Mother    Hyperlipidemia Mother    Heart attack Brother 90   Brain cancer Maternal Grandmother 66   Cancer Sister        Colon   Diabetes Sister    Heart disease Sister    Heart attack Maternal Grandfather     Social History   Social History Narrative   Not on file      Review of Systems  Constitutional:  Negative for chills, fever and malaise/fatigue.  HENT:  Positive for sore throat. Negative for congestion.   Eyes:  Negative for blurred vision.  Respiratory:  Positive for cough and sputum production (Discolored). Negative for shortness of breath.   Cardiovascular:  Negative for chest pain, palpitations and leg swelling.  Gastrointestinal:  Negative for vomiting.  Musculoskeletal:  Negative for back pain.  Skin:  Negative for rash.  Neurological:  Negative for loss of consciousness and headaches.        Objective:   Vitals: BP 116/68   Pulse 78   Temp 97.6 F (36.4 C) (Tympanic)   Ht 5\' 5"  (1.651 m)    Wt 174 lb (78.9 kg)   SpO2 98%   BMI 28.96 kg/m    Physical Exam Vitals and nursing note reviewed.  Constitutional:      General: She is not in acute distress.    Appearance: Normal appearance. She is not toxic-appearing.  HENT:     Head: Normocephalic and atraumatic.     Right Ear: Hearing, ear canal and external ear normal.     Left Ear: Hearing, ear canal and external ear normal.     Ears:     Comments: Right TM slightly full. Left TM full with splayed light reflex, slightly pink.    Mouth/Throat:     Comments: Pharynx injected without exudate. Neck:     Comments: Anterior cervical nodes bilaterally. Pulmonary:     Effort: Pulmonary effort is normal.  Skin:    General: Skin is warm and dry.  Neurological:     Mental Status: She is alert and oriented to person, place, and time. Mental status is at baseline.  Psychiatric:        Mood and Affect: Mood normal.        Behavior: Behavior normal.        Thought Content: Thought content normal.        Judgment: Judgment normal.       Results:   Studies obtained  and personally reviewed by me:   Labs:       Component Value Date/Time   NA 141 02/11/2023 1029   K 5.6 (H) 02/11/2023 1029   CL 104 02/11/2023 1029   CO2 26 02/11/2023 1029   GLUCOSE 193 (H) 02/11/2023 1029   BUN 13 02/11/2023 1029   CREATININE 0.71 02/11/2023 1029   CALCIUM 10.1 02/11/2023 1029   PROT 7.2 02/11/2023 1029   ALBUMIN 4.5 04/08/2017 1002   AST 15 02/11/2023 1029   ALT 19 02/11/2023 1029   ALKPHOS 94 04/08/2017 1002   BILITOT 0.6 02/11/2023 1029   GFRNONAA 91 04/17/2021 1155   GFRAA 106 04/17/2021 1155     Lab Results  Component Value Date   WBC 6.5 02/11/2023   HGB 16.0 (H) 02/11/2023   HCT 46.8 (H) 02/11/2023   MCV 91.6 02/11/2023   PLT 214 02/11/2023    Lab Results  Component Value Date   CHOL 165 02/11/2023   HDL 44 (L) 02/11/2023   LDLCALC 88 02/11/2023   TRIG 248 (H) 02/11/2023   CHOLHDL 3.8 02/11/2023    Lab  Results  Component Value Date   HGBA1C 8.9 (H) 02/11/2023     Lab Results  Component Value Date   TSH 1.96 11/12/2022      Assessment & Plan:   Protracted lower respiratory infection: prescribed doxycycline 100 mg twice daily for 10 days, Diflucan 150 mg once if needed for Candida vaginitis. Strep test negative. Contact us if symptoms do not improve. Continue Singulair.   Type 2 diabetes treated with Semaglutide and glucotrol    I,Alexander Ruley,acting as a scribe for Margaree Mackintosh, MD.,have documented all relevant documentation on the behalf of Margaree Mackintosh, MD,as directed by  Margaree Mackintosh, MD while in the presence of Margaree Mackintosh, MD.   I, Margaree Mackintosh, MD, have reviewed all documentation for this visit. The documentation on 04/04/23 for the exam, diagnosis, procedures, and orders are all accurate and complete.

## 2023-03-04 ENCOUNTER — Ambulatory Visit (AMBULATORY_SURGERY_CENTER): Payer: 59

## 2023-03-04 ENCOUNTER — Encounter: Payer: Self-pay | Admitting: Gastroenterology

## 2023-03-04 VITALS — Ht 65.0 in | Wt 173.0 lb

## 2023-03-04 DIAGNOSIS — Z86010 Personal history of colonic polyps: Secondary | ICD-10-CM

## 2023-03-04 DIAGNOSIS — Z800 Family history of malignant neoplasm of digestive organs: Secondary | ICD-10-CM

## 2023-03-04 MED ORDER — NA SULFATE-K SULFATE-MG SULF 17.5-3.13-1.6 GM/177ML PO SOLN: 1.0000 | Freq: Once | ORAL | 0 refills | Status: AC

## 2023-03-04 NOTE — Progress Notes (Addendum)
No soy allergy known to patient  Pt listed with allergy to soy products. Pt states this is very minimal and that she still eats eggs with no issue No issues known to pt with past sedation with any surgeries or procedures Patient denies ever being told they had issues or difficulty with intubation  No FH of Malignant Hyperthermia Pt is not on diet pills Pt is not on  home 02  Pt is not on blood thinners  Pt reports constipation with ozempic reports a BM 2 - 3 times a week. No straining stools not hard. Instructions given for extra miralax prior to procedure . No A fib or A flutter Have any cardiac testing pending--no Pt is ambulatory    Patient's chart reviewed by Candice [name] CNRA prior to previsit and patient appropriate for the [facility].  Previsit completed and red dot placed by patient's name on their procedure day (on provider's schedule).      PV completed with patient. Prep reviewed. Instructions sent via MyChart and to mailing address. Goodrx coupon for [employer] sent if patient has any issues with pricing of prep solution  Pt instructed to use Singlecare.com or GoodRx for a price reduction on prep

## 2023-03-22 ENCOUNTER — Encounter: Payer: Self-pay | Admitting: Certified Registered Nurse Anesthetist

## 2023-03-24 ENCOUNTER — Telehealth: Payer: Self-pay | Admitting: Gastroenterology

## 2023-03-24 DIAGNOSIS — Z86010 Personal history of colonic polyps: Secondary | ICD-10-CM

## 2023-03-24 DIAGNOSIS — Z800 Family history of malignant neoplasm of digestive organs: Secondary | ICD-10-CM

## 2023-03-24 NOTE — Telephone Encounter (Signed)
Called the patient back,. She was treated for Acute lower resp infection the beginning of May. She had recovered from that but now is experiencing runny nose, Cough, sore throat and wheezing. Will reschedule the patient for August. New instructions sent to patient.

## 2023-03-24 NOTE — Telephone Encounter (Signed)
Chart entered in error

## 2023-03-24 NOTE — Telephone Encounter (Signed)
Patient states she is experiencing cough sore throat weezing and runny nose but no fever. Procedure tomorrow morning at 9 am  Please advise

## 2023-03-24 NOTE — Telephone Encounter (Signed)
Okay, thanks for letting me know

## 2023-03-25 ENCOUNTER — Encounter: Payer: 59 | Admitting: Gastroenterology

## 2023-04-04 NOTE — Patient Instructions (Addendum)
You have been diagnosed with a persistent lower respiratory infection.  We have prescribed doxycycline 100 mg twice daily for 10 days, Diflucan 150 mg if needed for Candida vaginitis while on antibiotics.  Rapid strep screen is negative.  Contact us if symptoms or not improving within a few days.  Continue Singulair.  Rest and stay well-hydrated.

## 2023-05-09 ENCOUNTER — Other Ambulatory Visit: Payer: Self-pay | Admitting: Nurse Practitioner

## 2023-05-09 DIAGNOSIS — E119 Type 2 diabetes mellitus without complications: Secondary | ICD-10-CM

## 2023-05-18 ENCOUNTER — Encounter: Payer: Self-pay | Admitting: Gastroenterology

## 2023-05-24 ENCOUNTER — Encounter: Payer: Self-pay | Admitting: Certified Registered Nurse Anesthetist

## 2023-05-26 ENCOUNTER — Encounter: Payer: 59 | Admitting: Gastroenterology

## 2023-05-27 ENCOUNTER — Encounter: Payer: Self-pay | Admitting: Gastroenterology

## 2023-05-27 ENCOUNTER — Ambulatory Visit: Payer: 59 | Admitting: Gastroenterology

## 2023-05-27 VITALS — BP 126/65 | HR 77 | Temp 96.2°F | Ht 65.0 in | Wt 173.0 lb

## 2023-05-27 DIAGNOSIS — Z86010 Personal history of colonic polyps: Secondary | ICD-10-CM

## 2023-05-27 MED ORDER — SODIUM CHLORIDE 0.9 % IV SOLN
500.0000 mL | Freq: Once | INTRAVENOUS | Status: DC
Start: 2023-05-27 — End: 2024-05-05

## 2023-05-27 MED ORDER — FLEET ENEMA RE ENEM
1.0000 | ENEMA | Freq: Once | RECTAL | Status: AC
Start: 2023-05-27 — End: 2023-05-27
  Administered 2023-05-27: 1 via RECTAL

## 2023-05-27 MED ORDER — NA SULFATE-K SULFATE-MG SULF 17.5-3.13-1.6 GM/177ML PO SOLN
1.0000 | Freq: Once | ORAL | 0 refills | Status: AC
Start: 2023-05-27 — End: 2023-05-27

## 2023-05-27 NOTE — Progress Notes (Signed)
Pt reports her stool is dark brown with solid pieces of stool. Fleets enema given and stool was dark brown with chunk of solid stool. MD made aware and spoke to pt. Pt rescheduled with a 2 day prep.

## 2023-05-27 NOTE — Progress Notes (Signed)
Pt rescheduled for 07/15/23 at 2:30pm. New instructions printed and reviewed with pt in detail. New prescription for prep sent to pt's pharmacy. Instructed pt to take Miralax 1 capful twice daily for constipation, and can increase to 2 capfuls twice daily for optimal BMs. Pt verbalized understanding and will call back if constipation continues after trying Miralax daily.

## 2023-05-27 NOTE — Progress Notes (Signed)
Spoke with patient - she has chronic constipation, worse recently on Ozempic. One BM per week. Has been on Miralax once daily which has not helped. Took the prep overnight as recommended, poor stool output. She is not clear, still having some solid stool. She is not ready for colonoscopy today. Discussed options.  Offered her more prep today with a procedure tomorrow but she has to work can't make that happen.   We will reschedule the colonoscopy at another time with me. Recommend high dose Miralax twice daily for now, can titrate up to double BID if needed. Will reschedule with a double prep as well. If high dose Miralax does not help her in the interim she needs to let me know we can give her something stronger. She agrees with the plan, all questions answered.

## 2023-06-10 ENCOUNTER — Ambulatory Visit: Payer: 59 | Admitting: Nurse Practitioner

## 2023-06-10 ENCOUNTER — Encounter: Payer: Self-pay | Admitting: Nurse Practitioner

## 2023-06-10 VITALS — BP 118/70 | HR 76 | Temp 97.8°F | Ht 65.0 in | Wt 173.8 lb

## 2023-06-10 DIAGNOSIS — E119 Type 2 diabetes mellitus without complications: Secondary | ICD-10-CM

## 2023-06-10 DIAGNOSIS — D751 Secondary polycythemia: Secondary | ICD-10-CM

## 2023-06-10 DIAGNOSIS — Z87891 Personal history of nicotine dependence: Secondary | ICD-10-CM

## 2023-06-10 DIAGNOSIS — Z8249 Family history of ischemic heart disease and other diseases of the circulatory system: Secondary | ICD-10-CM

## 2023-06-10 DIAGNOSIS — E1122 Type 2 diabetes mellitus with diabetic chronic kidney disease: Secondary | ICD-10-CM

## 2023-06-10 DIAGNOSIS — E538 Deficiency of other specified B group vitamins: Secondary | ICD-10-CM

## 2023-06-10 DIAGNOSIS — F411 Generalized anxiety disorder: Secondary | ICD-10-CM

## 2023-06-10 DIAGNOSIS — N182 Chronic kidney disease, stage 2 (mild): Secondary | ICD-10-CM

## 2023-06-10 DIAGNOSIS — E785 Hyperlipidemia, unspecified: Secondary | ICD-10-CM

## 2023-06-10 DIAGNOSIS — E559 Vitamin D deficiency, unspecified: Secondary | ICD-10-CM

## 2023-06-10 DIAGNOSIS — E1169 Type 2 diabetes mellitus with other specified complication: Secondary | ICD-10-CM

## 2023-06-10 DIAGNOSIS — Z79899 Other long term (current) drug therapy: Secondary | ICD-10-CM

## 2023-06-10 DIAGNOSIS — E66811 Obesity, class 1: Secondary | ICD-10-CM

## 2023-06-10 DIAGNOSIS — R0989 Other specified symptoms and signs involving the circulatory and respiratory systems: Secondary | ICD-10-CM

## 2023-06-10 DIAGNOSIS — E669 Obesity, unspecified: Secondary | ICD-10-CM

## 2023-06-10 NOTE — Progress Notes (Signed)
Follow Up  Assessment and Plan:  Candice [name] was seen today for a general follow up.  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 Stay well hydrated. Avoid high salt foods. Avoid NSAIDS. Keep BP and BG well controlled.   Take medications as prescribed. Remain active and exercise as tolerated daily. Maintain weight.  Continue to monitor. Check CMP/GFR/Microablumin   Type 2 diabetes mellitus with other specified complication, without long-term current use of insulin (HCC) Discussed increase Semaglutide dose during next refill. Education: Reviewed 'ABCs' of diabetes management  Discussed goals to be met and/or maintained include A1C (<7) Blood pressure (<130/80) Cholesterol (LDL <70) Continue Eye Exam yearly  Continue Dental Exam Q6 mo Discussed dietary recommendations Discussed Physical Activity recommendations Check A1C   Hyperlipidemia associated with type 2 diabetes mellitus (HCC) Discussed lifestyle modifications. Recommended diet heavy in fruits and veggies, omega 3's. Decrease consumption of animal meats, cheeses, and dairy products. Remain active and exercise as tolerated. Continue to monitor. Check lipids/TSH  Vitamin D deficiency Continue supplement Monitor levels  Medication management All medications discussed and reviewed in full. All questions and concerns regarding medications addressed.    Obesity (BMI 30.0-34.9) Discussed appropriate BMI Diet modification. Physical activity. Encouraged/praised to build confidence.   Polycythemia Continue to monitor CBC, ferrtitin, TIBC.  GAD Continue Celexa Reviewed relaxation techniques.  Sleep hygiene. Recommended Cognitive Behavioral Therapy (CBT). Recommended mindfulness meditation and exercise.   Encouraged personality growth wand development through coping techniques and problem-solving  skills. Limit/Decrease/Monitor drug/alcohol intake.     Labile hypertension Discussed DASH (Dietary Approaches to Stop Hypertension) DASH diet is lower in sodium than a typical American diet. Cut back on foods that are high in saturated fat, cholesterol, and trans fats. Eat more whole-grain foods, fish, poultry, and nuts Remain active and exercise as tolerated daily.  Monitor BP at home-Call if greater than 130/80.  Check CMP/CBC   B12 def Continue supplement Monitor levels  Family history of abdominal aortic aneurysm (AAA) Monitor; control BP, routine screening,  Complete AAA [place]  Former smoker (20 pack year hx, quit 2019) Lung cancer screening with low dose CT discussed as recommended by guidelines based on age, number of pack year history.  Discussed risks of screening including but not limited to false positives on xray, further testing or consultation with specialist, and possible false negative CT as well. Understanding expressed and wishes to proceed with CT testing.  Order placed  Medication management All medications discussed and reviewed in full. All questions and concerns regarding medications addressed.    Orders Placed This Encounter  Procedures   CBC with Differential/Platelet   COMPLETE METABOLIC PANEL WITH GFR   Lipid panel   Hemoglobin A1c   Notify office for further evaluation and treatment, questions or concerns if any reported s/s fail to improve.   The patient was advised to call back or seek an in-person evaluation if any symptoms worsen or if the condition fails to improve as anticipated.   Further disposition pending results of labs. Discussed med's effects and SE's.    I discussed the assessment and treatment plan with the patient. The patient was provided an opportunity to ask questions and all were answered. The patient agreed with the plan and demonstrated an understanding of the instructions.  Discussed med's effects and SE's. Screening labs  and tests as requested with regular follow-up as recommended.  I provided 25 minutes of face-to-face time during this encounter including counseling, chart review, and critical decision making was preformed.   Future Appointments  Date Time Provider Department Center  07/15/2023  2:30 PM Armbruster, Willaim Rayas, MD [facility]  11/02/2023  3:00 PM [name], NP [facility] None     HPI  67 y.o. female  presents for a general follow up has Former smoker (20 pack year, quit 2019); Diabetes mellitus (HCC); Hypertriglyceridemia; Generalized anxiety disorder; Vitamin D deficiency; Medication management; Obesity (BMI 30.0-34.9); CKD stage 2 due to type 2 diabetes mellitus (HCC); Polycythemia; Vitamin B 12 deficiency; and Serum creatinine raised on their problem list.   Overall she reports feeling well.   She is married, 1 daughter, 1 grandson and new granddaughter. She enjoys spending time with them.  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 She keeps grandchildren in the afternoon. Having some personal family issues but overall managed. No further concerns at this time.   She follows with GYN, PA Josph Macho at [facility], however, not longer following.  Had Pap in 2021, normal and done.   She is on OTC antihistamine and Singulair for seasonable allergies. Has had lingering fullness in ears, muffled hearing.  Feels like swimmer's ear.   She is off of celexa, reports has been doing failry off of med, wants to star off for now.   She is a former smoker, 20 pack year hx, quit 2019, discussed low dose CT.   BMI is Body mass index is 28.92 kg/m., she has been working on diet and exercise.  She is also continuing Ozempic 1 mg/week. Wt Readings from Last 3 Encounters:  06/10/23 173 lb 12.8 oz (78.8 kg)  05/27/23 173 lb (78.5 kg)  03/04/23 173 lb (78.5 kg)   Her blood pressure has been controlled at home, today their BP is BP: 118/70 She does not workout. She  denies chest pain, shortness of breath, dizziness.   She is on cholesterol medication (rosuvastatin 40 mg daily)  and denies myalgias. Her cholesterol is not at goal. The cholesterol last visit was:   Lab Results  Component Value Date   CHOL 165 02/11/2023   HDL 44 (L) 02/11/2023   LDLCALC 88 02/11/2023   TRIG 248 (H) 02/11/2023   CHOLHDL 3.8 02/11/2023   She has been working on diet and exercise for T2DM was following with Dr. Elvera Lennox as well, last seen 11/2019, patient pref not to go back, she is on bASA, she is not on ACE/ARB (low BPs, patient adamantly declines) and denies hypoglycemia , increased appetite, nausea, paresthesia of the feet, polydipsia and polyuria.  She does check fasting glucose, ranging 150-180s. She is now on Semaglutide/Ozempic 1 mg/week with constipation every 2-3 days BM.  She is starting a stool softener.  Last A1C in the office was:  Lab Results  Component Value Date   HGBA1C 8.9 (H) 02/11/2023   Last GFR: Lab Results  Component Value Date   GFRNONAA 91 04/17/2021   Patient is not on Vitamin D supplement, forgets to take, has been recommended 4000 IU.  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 No immature cells, adequate platelets, Erythrocytosis with unremarkable RBC morphology.     Latest Ref Rng & Units 02/11/2023  10:29 AM 11/12/2022   12:00 AM 06/18/2022   12:52 PM  CBC  WBC 3.8 - 10.8 Thousand/uL 6.5  6.8  7.1   Hemoglobin 11.7 - 15.5 g/dL 84.6  96.2  95.2   Hematocrit 35.0 - 45.0 % 46.8  48.9  48.0   Platelets 140 - 400 Thousand/uL 214  221  230    Lab Results  Component Value Date   IRON 126 06/27/2020   TIBC 352 08/15/2019   FERRITIN 179 06/27/2020   Currently on Vit B12 supplement.  Last  levels: Lab Results  Component Value Date   VITAMINB12 598 11/12/2022      Current Medications:  Current Outpatient Medications on File Prior to Visit  Medication Sig Dispense Refill   aspirin EC 81 MG tablet Take 81 mg by mouth daily.     Cholecalciferol (VITAMIN D3) 50 MCG (2000 UT) TABS Take 5,000 Units by mouth daily. 30 tablet    citalopram (CELEXA) 40 MG tablet Take 1 tablet by mouth Daily for Mood 90 tablet 3   Cyanocobalamin (B-12) 1000 MCG SUBL Place 1 tablet under the tongue daily. 30 tablet 0   empagliflozin ([name]) 25 MG TABS tablet Take 1 tablet (25 mg total) by mouth daily. 90 tablet 3   glipiZIDE (GLUCOTROL) 5 MG tablet TAKE 2 TABLETS BEFORE BREAKFAST AND 1-2 TABLETS BEFORE MIDNIGHT. 360 tablet 3   meclizine (ANTIVERT) 25 MG tablet 1/2-1 pill up to 3 times daily for motion sickness/dizziness (Patient taking differently: as needed. 1/2-1 pill up to 3 times daily for motion sickness/dizziness) 30 tablet 0   montelukast (SINGULAIR) 10 MG tablet TAKE ONE TABLET AT BEDTIME. 90 tablet 3   OVER THE COUNTER MEDICATION daily. Takes an OTC Allertec     OZEMPIC, 1 MG/DOSE, 4 MG/3ML SOPN DIAL AND INJECT UNDER THE SKIN 1 MG WEEKLY 3 mL 2   rosuvastatin (CRESTOR) 40 MG tablet TAKE 1 TABLET ONCE A DAY TO CONTROL CHOLESTEROL. 90 tablet 3   doxycycline (VIBRA-TABS) 100 MG tablet Take 1 tablet (100 mg total) by mouth 2 (two) times daily. 20 tablet 0   fluconazole (DIFLUCAN) 150 MG tablet 1 po stat, repeat in 3 days     Current Facility-Administered Medications on File Prior to Visit  Medication Dose Route Frequency Provider Last Rate Last Admin   0.9 %  sodium chloride infusion  500 mL Intravenous Once Armbruster, Willaim Rayas, MD       Allergies:  Allergies  Allergen Reactions   Levaquin [Levofloxacin] Hives and Other (See Comments)    Tingling sensation to mouth, near LOC, blurred vision,     Iodine     Unknown reaction    Levofloxacin In D5w Hives    Hives   Metformin Diarrhea    Metformin And Related Diarrhea   Shellfish Allergy     Unknown reaction    Amoxicillin Rash   Egg-Derived Products     Low, by skin test only, local itching when injected    Kombiglyze [Saxagliptin-Metformin Er] Other (See Comments)    Bloating   Latex Itching and Rash   Penicillins Rash   Medical History:  She has Former smoker (20 pack year, quit 2019); Diabetes mellitus (HCC); Hypertriglyceridemia; Generalized anxiety disorder; Vitamin D deficiency; Medication management; Obesity (BMI 30.0-34.9); CKD stage 2 due to type 2 diabetes mellitus (HCC); Polycythemia; Vitamin B 12 deficiency; and Serum creatinine raised on their problem list. Health Maintenance:   Immunization History  Administered Date(s) Administered   Influenza Inj  Mdck Quad With Preservative 08/15/2019, 08/29/2020   Influenza,inj,Quad PF,6+ Mos 10/23/2021   Janssen (J&J) SARS-COV-2 Vaccination 12/17/2020   Tdap 02/14/2013   Patient Care Team: Lucky Cowboy, MD as PCP - General (Internal Medicine) Henreitta Leber, PA-C as Physician Assistant (Obstetrics and Gynecology)  Surgical History:  She has a past surgical history that includes Cesarean section; Tonsillectomy; Appendectomy; and Cholecystectomy. Family History:  Herfamily history includes AAA (abdominal aortic aneurysm) in her mother; Brain cancer (age of onset: 40) in her maternal grandmother; Cancer in her sister; Colon cancer (age of onset: 22 - 72) in her sister; Diabetes in her sister; Heart attack in her maternal grandfather; Heart attack (age of onset: 72) in her brother; Heart disease in her sister; Hyperlipidemia in her mother. Social History:  She reports that she quit smoking about 5 years ago. Her smoking use included cigarettes. She started smoking about 45 years ago. She has a 20 pack-year smoking history. She has never used smokeless tobacco. She reports that she does not drink alcohol and does not use drugs.  Review of Systems: Review of  Systems  Constitutional:  Negative for malaise/fatigue and weight loss.  HENT:  Negative for hearing loss and tinnitus.   Eyes:  Negative for blurred vision and double vision.  Respiratory:  Negative for cough, sputum production, shortness of breath and wheezing.   Cardiovascular:  Negative for chest pain, palpitations, orthopnea, claudication, leg swelling and PND.  Gastrointestinal:  Negative for abdominal pain, blood in stool, constipation, diarrhea, heartburn, melena, nausea and vomiting.  Genitourinary: Negative.   Musculoskeletal:  Negative for falls, joint pain and myalgias.  Skin:  Negative for rash.  Neurological:  Negative for dizziness, tingling, sensory change, weakness and headaches.  Endo/Heme/Allergies:  Negative for polydipsia.  Psychiatric/Behavioral: Negative.  Negative for depression, memory loss, substance abuse and suicidal ideas. The patient is not nervous/anxious and does not have insomnia.   All other systems reviewed and are negative.   Physical Exam: Estimated body mass index is 28.92 kg/m as calculated from the following:   Height as of this encounter: 5\' 5"  (1.651 m).   Weight as of this encounter: 173 lb 12.8 oz (78.8 kg). BP 118/70   Pulse 76   Temp 97.8 F (36.6 C)   Ht 5\' 5"  (1.651 m)   Wt 173 lb 12.8 oz (78.8 kg)   SpO2 98%   BMI 28.92 kg/m  General Appearance: Well nourished, in no apparent distress.  Eyes: PERRLA, EOMs, conjunctiva no swelling or erythema, fundal exam deferred to ophth  Sinuses: No Frontal/maxillary tenderness  ENT/Mouth: Ext aud canals clear, normal light reflex with TMs without erythema, bulging. Good dentition. No erythema, swelling, or exudate on post pharynx. Tonsils not swollen or erythematous. Hearing normal.  Neck: Supple, thyroid normal. No bruits  Respiratory: Respiratory effort normal, BS equal bilaterally without rales, rhonchi, wheezing or stridor.  Cardio: RRR without murmurs, rubs or gallops. Brisk peripheral  pulses without edema.  Chest: symmetric, with normal excursions and percussion.  Breasts: Defer to GYN, getting mammogram  Abdomen: Soft, nontender, no guarding, rebound, hernias, masses, or organomegaly.  Lymphatics: Non tender without lymphadenopathy.  Genitourinary: Defer to GYN Musculoskeletal: Full ROM all peripheral extremities,5/5 strength, and normal gait.  Skin: Warm, dry without rashes, lesions, ecchymosis. Neuro: Cranial nerves intact, reflexes equal bilaterally. Normal muscle tone, no cerebellar symptoms. Sensation intact.  Psych: Awake and oriented X 3, normal affect, Insight and Judgment appropriate.    [name], NP 10:11 AM [facility]

## 2023-06-10 NOTE — Patient Instructions (Signed)
????   Constipation, Adult ?????????????? 3 ??????????????????????????????????????????????????????????????????????????????? ????????? ??  ????????????????????????? ?????????????????????????????????????????????????? ???????????????????? ???? ?????????????????????????? 150 ????????? ?????????????????? ???????????????????????????????????? ????? ?????????????? ??????? ?????????????????????????????? ?????????????????????????????? ???????????????????? ??????? ???? ??? 4 ????? ?????? ?????????????? ???????????????? ???????????? ?????????????? ????????????? ??????????? ?????? ???????????? ?????????? ?? ?????????????? 3 ???????????????????????????? ????????????????????????? ???????????????????? ???????????????????????????????????? ???????????????????????????????????????????????? Document Revised: 11/01/2019 Document Reviewed: 08/13/2022 Elsevier Patient Education  2024 [employer].

## 2023-06-11 ENCOUNTER — Telehealth: Payer: Self-pay | Admitting: Nurse Practitioner

## 2023-06-11 LAB — COMPLETE METABOLIC PANEL WITH GFR
AG Ratio: 1.9 (calc) (ref 1.0–2.5)
ALT: 14 U/L (ref 6–29)
AST: 11 U/L (ref 10–35)
Albumin: 4.7 g/dL (ref 3.6–5.1)
Alkaline phosphatase (APISO): 100 U/L (ref 37–153)
BUN: 13 mg/dL (ref 7–25)
CO2: 26 mmol/L (ref 20–32)
Calcium: 9.9 mg/dL (ref 8.6–10.4)
Chloride: 103 mmol/L (ref 98–110)
Creat: 0.7 mg/dL (ref 0.50–1.05)
Globulin: 2.5 g/dL (ref 1.9–3.7)
Glucose, Bld: 190 mg/dL — ABNORMAL HIGH (ref 65–99)
Potassium: 5.2 mmol/L (ref 3.5–5.3)
Sodium: 140 mmol/L (ref 135–146)
Total Bilirubin: 0.7 mg/dL (ref 0.2–1.2)
Total Protein: 7.2 g/dL (ref 6.1–8.1)
eGFR: 97 mL/min/{1.73_m2} (ref >=60)

## 2023-06-11 LAB — CBC WITH DIFFERENTIAL/PLATELET
Absolute Monocytes: 497 {cells}/uL (ref 200–950)
Basophils Absolute: 50 {cells}/uL (ref 0–200)
Basophils Relative: 0.7 %
Eosinophils Absolute: 142 {cells}/uL (ref 15–500)
Eosinophils Relative: 2 %
HCT: 48.2 % — ABNORMAL HIGH (ref 35.0–45.0)
Hemoglobin: 16.1 g/dL — ABNORMAL HIGH (ref 11.7–15.5)
Lymphs Abs: 1761 {cells}/uL (ref 850–3900)
MCH: 31.6 pg (ref 27.0–33.0)
MCHC: 33.4 g/dL (ref 32.0–36.0)
MCV: 94.5 fL (ref 80.0–100.0)
MPV: 11 fL (ref 7.5–12.5)
Monocytes Relative: 7 %
Neutro Abs: 4651 {cells}/uL (ref 1500–7800)
Neutrophils Relative %: 65.5 %
Platelets: 222 10*3/uL (ref 140–400)
RBC: 5.1 10*6/uL (ref 3.80–5.10)
RDW: 12.2 % (ref 11.0–15.0)
Total Lymphocyte: 24.8 %
WBC: 7.1 10*3/uL (ref 3.8–10.8)

## 2023-06-11 LAB — LIPID PANEL
Cholesterol: 141 mg/dL (ref <200)
HDL: 42 mg/dL — ABNORMAL LOW (ref >=50)
LDL Cholesterol (Calc): 70 mg/dL
Non-HDL Cholesterol (Calc): 99 mg/dL (ref <130)
Total CHOL/HDL Ratio: 3.4 (calc) (ref <5.0)
Triglycerides: 217 mg/dL — ABNORMAL HIGH (ref <150)

## 2023-06-11 LAB — HEMOGLOBIN A1C
Hgb A1c MFr Bld: 8.7 %{Hb} — ABNORMAL HIGH (ref <5.7)
Mean Plasma Glucose: 203 mg/dL
eAG (mmol/L): 11.2 mmol/L

## 2023-06-11 MED ORDER — CITALOPRAM HYDROBROMIDE 40 MG PO TABS: 40.0000 mg | ORAL_TABLET | Freq: Every day | ORAL | 3 refills | Status: DC

## 2023-06-11 NOTE — Telephone Encounter (Signed)
Requesting refill on celexa to [facility].

## 2023-06-11 NOTE — Addendum Note (Signed)
Addended by: Dionicio Stall on: 06/11/2023 04:41 PM   Modules accepted: Orders

## 2023-07-06 ENCOUNTER — Other Ambulatory Visit: Payer: Self-pay | Admitting: Nurse Practitioner

## 2023-07-06 DIAGNOSIS — E1169 Type 2 diabetes mellitus with other specified complication: Secondary | ICD-10-CM

## 2023-07-08 ENCOUNTER — Encounter: Payer: Self-pay | Admitting: Gastroenterology

## 2023-07-15 ENCOUNTER — Ambulatory Visit (AMBULATORY_SURGERY_CENTER): Payer: Medicare Other | Admitting: Gastroenterology

## 2023-07-15 ENCOUNTER — Encounter: Payer: Self-pay | Admitting: Gastroenterology

## 2023-07-15 VITALS — BP 106/49 | HR 77 | Temp 97.7°F | Resp 15 | Ht 65.0 in | Wt 173.0 lb

## 2023-07-15 DIAGNOSIS — Z09 Encounter for follow-up examination after completed treatment for conditions other than malignant neoplasm: Secondary | ICD-10-CM | POA: Diagnosis not present

## 2023-07-15 DIAGNOSIS — Z800 Family history of malignant neoplasm of digestive organs: Secondary | ICD-10-CM

## 2023-07-15 DIAGNOSIS — D122 Benign neoplasm of ascending colon: Secondary | ICD-10-CM | POA: Diagnosis not present

## 2023-07-15 DIAGNOSIS — D123 Benign neoplasm of transverse colon: Secondary | ICD-10-CM | POA: Diagnosis not present

## 2023-07-15 DIAGNOSIS — D125 Benign neoplasm of sigmoid colon: Secondary | ICD-10-CM | POA: Diagnosis not present

## 2023-07-15 DIAGNOSIS — Z860100 Personal history of colon polyps, unspecified: Secondary | ICD-10-CM | POA: Diagnosis not present

## 2023-07-15 DIAGNOSIS — D120 Benign neoplasm of cecum: Secondary | ICD-10-CM | POA: Diagnosis not present

## 2023-07-15 MED ORDER — SODIUM CHLORIDE 0.9 % IV SOLN: 500.0000 mL | INTRAVENOUS | Status: DC

## 2023-07-15 NOTE — Progress Notes (Signed)
Pt's states no medical or surgical changes since previsit or office visit. 

## 2023-07-15 NOTE — Patient Instructions (Signed)
Discharge instructions given. Handouts on polyps,Diverticulosis and Hemorrhoids. Resume previous medications. YOU HAD AN ENDOSCOPIC PROCEDURE TODAY AT [facility]:   Refer to the procedure report that was given to you for any specific questions about what was found during the examination.  If the procedure report does not answer your questions, please call your gastroenterologist to clarify.  If you requested that your care partner not be given the details of your procedure findings, then the procedure report has been included in a sealed envelope for you to review at your convenience later.  YOU SHOULD EXPECT: Some feelings of bloating in the abdomen. Passage of more gas than usual.  Walking can help get rid of the air that was put into your GI tract during the procedure and reduce the bloating. If you had a lower endoscopy (such as a colonoscopy or flexible sigmoidoscopy) you may notice spotting of blood in your stool or on the toilet paper. If you underwent a bowel prep for your procedure, you may not have a normal bowel movement for a few days.  Please Note:  You might notice some irritation and congestion in your nose or some drainage.  This is from the oxygen used during your procedure.  There is no need for concern and it should clear up in a day or so.  SYMPTOMS TO REPORT IMMEDIATELY:  Following lower endoscopy (colonoscopy or flexible sigmoidoscopy):  Excessive amounts of blood in the stool  Significant tenderness or worsening of abdominal pains  Swelling of the abdomen that is new, acute  Fever of 100F or higher   For urgent or emergent issues, a gastroenterologist can be reached at any hour by calling [phone]. Do not use MyChart messaging for urgent concerns.    DIET:  We do recommend a small meal at first, but then you may proceed to your regular diet.  Drink plenty of fluids but you should avoid alcoholic beverages for 24 hours.  ACTIVITY:  You should  plan to take it easy for the rest of today and you should NOT DRIVE or use heavy machinery until tomorrow (because of the sedation medicines used during the test).    FOLLOW UP: Our staff will call the number listed on your records the next business day following your procedure.  We will call around 7:15- 8:00 am to check on you and address any questions or concerns that you may have regarding the information given to you following your procedure. If we do not reach you, we will leave a message.     If any biopsies were taken you will be contacted by phone or by letter within the next 1-3 weeks.  Please call us at [phone] if you have not heard about the biopsies in 3 weeks.    SIGNATURES/CONFIDENTIALITY: You and/or your care partner have signed paperwork which will be entered into your electronic medical record.  These signatures attest to the fact that that the information above on your After Visit Summary has been reviewed and is understood.  Full responsibility of the confidentiality of this discharge information lies with you and/or your care-partner.

## 2023-07-15 NOTE — Op Note (Signed)
[facility] Patient Name: Candice [name] Procedure Date: 07/15/2023 2:06 PM MRN: [id number] Endoscopist: Viviann Spare P. Adela Lank , MD, [id number] Age: 67 Referring MD:  Date of Birth: June 05, 1956 Gender: Female Account #: [id number] Procedure:                Colonoscopy Indications:              High risk colon cancer surveillance: Personal                            history of colonic polyps - history of adenomas                            removed 2012, sister with colon cancer age 43s -                            last exam 2012 Medicines:                Monitored Anesthesia Care Procedure:                Pre-Anesthesia Assessment:                           - Prior to the procedure, a History and Physical                            was performed, and patient medications and                            allergies were reviewed. The patient's tolerance of                            previous anesthesia was also reviewed. The risks                            and benefits of the procedure and the sedation                            options and risks were discussed with the patient.                            All questions were answered, and informed consent                            was obtained. Prior Anticoagulants: The patient has                            taken no anticoagulant or antiplatelet agents. ASA                            Grade Assessment: II - A patient with mild systemic                            disease. After reviewing the risks and benefits,  the patient was deemed in satisfactory condition to                            undergo the procedure.                           After obtaining informed consent, the colonoscope                            was passed under direct vision. Throughout the                            procedure, the patient's blood pressure, pulse, and                            oxygen saturations were monitored  continuously. The                            Olympus PCF-[id number] (#[id number]) Colonoscope was                            introduced through the anus and advanced to the the                            cecum, identified by appendiceal orifice and                            ileocecal valve. The colonoscopy was performed                            without difficulty. The patient tolerated the                            procedure well. The quality of the bowel                            preparation was good. The ileocecal valve,                            appendiceal orifice, and rectum were photographed. Scope In: 2:11:21 PM Scope Out: 2:39:27 PM Scope Withdrawal Time: 0 hours 22 minutes 44 seconds  Total Procedure Duration: 0 hours 28 minutes 6 seconds  Findings:                 The perianal and digital rectal examinations were                            normal.                           A single large angiodysplastic lesion was found in                            the cecum.  A diminutive polyp was found in the cecum. The                            polyp was sessile. The polyp was removed with a                            cold snare. Resection and retrieval were complete.                           A 5 mm polyp was found in the ascending colon. The                            polyp was sessile. The polyp was removed with a                            cold snare. Resection and retrieval were complete.                           Two sessile polyps were found in the hepatic                            flexure. The polyps were 2 to 5 mm in size. These                            polyps were removed with a cold snare. Resection                            and retrieval were complete.                           Four sessile polyps were found in the transverse                            colon. The polyps were 3 to 5 mm in size. These                            polyps were removed  with a cold snare. Resection                            and retrieval were complete.                           Three sessile polyps were found in the sigmoid                            colon. The polyps were 3 to 4 mm in size. These                            polyps were removed with a cold snare. Resection                            and retrieval were  complete.                           Multiple small-mouthed diverticula were found in                            the sigmoid colon, transverse colon and cecum.                           Internal hemorrhoids were found during retroflexion.                           The exam was otherwise without abnormality. Complications:            No immediate complications. Estimated blood loss:                            Minimal. Estimated Blood Loss:     Estimated blood loss was minimal. Impression:               - A single colonic angiodysplastic lesion.                           - One diminutive polyp in the cecum, removed with a                            cold snare. Resected and retrieved.                           - One 5 mm polyp in the ascending colon, removed                            with a cold snare. Resected and retrieved.                           - Two 2 to 5 mm polyps at the hepatic flexure,                            removed with a cold snare. Resected and retrieved.                           - Four 3 to 5 mm polyps in the transverse colon,                            removed with a cold snare. Resected and retrieved.                           - Three 3 to 4 mm polyps in the sigmoid colon,                            removed with a cold snare. Resected and retrieved.                           - Diverticulosis in the sigmoid colon, in the  transverse colon and in the cecum.                           - Internal hemorrhoids.                           - The examination was otherwise normal. Recommendation:            - Patient has a contact number available for                            emergencies. The signs and symptoms of potential                            delayed complications were discussed with the                            patient. Return to normal activities tomorrow.                            Written discharge instructions were provided to the                            patient.                           - Resume previous diet.                           - Continue present medications.                           - Await pathology results. Anticipate repeat                            colonoscopy in one year for surveillance. Viviann Spare P. Dallana Mavity, MD 07/15/2023 2:47:56 PM This report has been signed electronically.

## 2023-07-15 NOTE — Progress Notes (Signed)
[facility] History and Physical   Primary Care Physician:  Lucky Cowboy, MD   Reason for Procedure:   History of colon polyps  Plan:    colonoscopy     HPI: Candice [name] is a 67 y.o. female  here for colonoscopy surveillance - history of colonoscopy 3/12 with polyp removed per Dr. Kinnie Scales. Sister also had colon cancer dx age 77s.  Initially scheduled for this 8/14 but prep was inadequate - she has chronic constipation made worse with Ozempic. PReviously was taking Miralax once daily which did not help. We increased her Miralax dosing to twice daily and double prep used for this exam.   Otherwise feels well without any cardiopulmonary symptoms.   I have discussed risks / benefits of anesthesia and endoscopic procedure with Candice [name] and they wish to proceed with the exams as outlined today.    Past Medical History:  Diagnosis Date   Anxiety    Diabetes mellitus without complication (HCC)    Hx of colonic polyps    Fhx Colon cancer   Hyperlipidemia    Hypocholesteremia    Tobacco abuse     Past Surgical History:  Procedure Laterality Date   APPENDECTOMY     CESAREAN SECTION     CHOLECYSTECTOMY     TONSILLECTOMY      Prior to Admission medications   Medication Sig Start Date End Date Taking? Authorizing Provider  aspirin EC 81 MG tablet Take 81 mg by mouth daily.    [provider]  Cholecalciferol (VITAMIN D3) 50 MCG (2000 UT) TABS Take 5,000 Units by mouth daily. 10/24/21   Judd Gaudier, NP  citalopram (CELEXA) 40 MG tablet Take 1 tablet (40 mg total) by mouth daily. 06/11/23   [name], NP  Cyanocobalamin (B-12) 1000 MCG SUBL Place 1 tablet under the tongue daily. 08/29/20   Judd Gaudier, NP  glipiZIDE (GLUCOTROL) 5 MG tablet TAKE 2 TABLETS BEFORE BREAKFAST AND 1-2 TABLETS BEFORE MIDNIGHT. 07/21/22   Raynelle Dick, NP  [name] 25 MG TABS tablet Take 1 tablet (25 mg total) by mouth daily. 07/06/23   [name],  NP  meclizine (ANTIVERT) 25 MG tablet 1/2-1 pill up to 3 times daily for motion sickness/dizziness [name] taking differently: as needed. 1/2-1 pill up to 3 times daily for motion sickness/dizziness 11/12/22   [name], NP  montelukast (SINGULAIR) 10 MG tablet TAKE ONE TABLET AT BEDTIME. 08/19/22   Baxley, Luanna Cole, MD  OVER THE COUNTER MEDICATION daily. Takes an Librarian, academic, Historical, MD  OZEMPIC, 1 MG/DOSE, 4 MG/3ML SOPN DIAL AND INJECT UNDER THE SKIN 1 MG WEEKLY 05/09/23   Raynelle Dick, NP  rosuvastatin (CRESTOR) 40 MG tablet TAKE 1 TABLET ONCE A DAY TO CONTROL CHOLESTEROL. 07/06/23   [name], NP    Current Outpatient Medications  Medication Sig Dispense Refill   aspirin EC 81 MG tablet Take 81 mg by mouth daily.     Cholecalciferol (VITAMIN D3) 50 MCG (2000 UT) TABS Take 5,000 Units by mouth daily. 30 tablet    citalopram (CELEXA) 40 MG tablet Take 1 tablet (40 mg total) by mouth daily. 90 tablet 3   glipiZIDE (GLUCOTROL) 5 MG tablet TAKE 2 TABLETS BEFORE BREAKFAST AND 1-2 TABLETS BEFORE MIDNIGHT. 360 tablet 3   [name] 25 MG TABS tablet Take 1 tablet (25 mg total) by mouth daily. 90 tablet 3   montelukast (SINGULAIR) 10 MG tablet TAKE ONE TABLET AT BEDTIME. 90 tablet  3   OVER THE COUNTER MEDICATION daily. Takes an OTC Allertec     rosuvastatin (CRESTOR) 40 MG tablet TAKE 1 TABLET ONCE A DAY TO CONTROL CHOLESTEROL. 90 tablet 3   Cyanocobalamin (B-12) 1000 MCG SUBL Place 1 tablet under the tongue daily. ([name] not taking: Reported on 07/15/2023) 30 tablet 0   meclizine (ANTIVERT) 25 MG tablet 1/2-1 pill up to 3 times daily for motion sickness/dizziness ([name] taking differently: as needed. 1/2-1 pill up to 3 times daily for motion sickness/dizziness) 30 tablet 0   OZEMPIC, 1 MG/DOSE, 4 MG/3ML SOPN DIAL AND INJECT UNDER THE SKIN 1 MG WEEKLY 3 mL 2   Current Facility-Administered Medications  Medication Dose Route Frequency Provider Last Rate Last Admin    0.9 %  sodium chloride infusion  500 mL Intravenous Once Hanaa Payes, Willaim Rayas, MD       0.9 %  sodium chloride infusion  500 mL Intravenous Continuous Agapita Savarino, Willaim Rayas, MD        Allergies as of 07/15/2023 - Review Complete 07/15/2023  Allergen Reaction Noted   Levaquin [levofloxacin] Hives and Other (See Comments) 12/02/2012   Iodine  03/31/2013   Levofloxacin in d5w Hives 01/09/2014   Metformin Diarrhea 03/04/2023   Metformin and related Diarrhea 10/20/2013   Shellfish allergy  10/20/2013   Amoxicillin Rash 10/20/2013   Egg-derived products  10/20/2013   Kombiglyze [saxagliptin-metformin er] Other (See Comments) 10/20/2013   Latex Itching and Rash 12/02/2012   Penicillins Rash 10/20/2013    Family History  Problem Relation Age of Onset   AAA (abdominal aortic aneurysm) Mother    Hyperlipidemia Mother    Colon cancer Sister 73 - 11       estimated by [name]   Cancer Sister        Colon   Diabetes Sister    Heart disease Sister    Heart attack Brother 68   Brain cancer Maternal Grandmother 45   Heart attack Maternal Grandfather    Rectal cancer Neg Hx    Stomach cancer Neg Hx     Social History   Socioeconomic History   Marital status: Married    Spouse name: Not on file   Number of children: 1   Years of education: Not on file   Highest education level: Not on file  Occupational History   Not on file  Tobacco Use   Smoking status: Former    Current packs/day: 0.00    Average packs/day: 0.5 packs/day for 40.0 years (20.0 ttl pk-yrs)    Types: Cigarettes    Start date: 76    Quit date: 10/15/2017    Years since quitting: 5.7   Smokeless tobacco: Never  Vaping Use   Vaping status: Never Used  Substance and Sexual Activity   Alcohol use: No    Alcohol/week: 0.0 standard drinks of alcohol   Drug use: No   Sexual activity: Yes    Partners: Male    Birth control/protection: None  Other Topics Concern   Not on file  Social History Narrative   Not on  file   Social Determinants of Health   Financial Resource Strain: Not on file  Food Insecurity: Not on file  Transportation Needs: Not on file  Physical Activity: Sufficiently Active (08/15/2019)   Exercise Vital Sign    Days of Exercise per Week: 7 days    Minutes of Exercise per Session: 30 min  Stress: No Stress Concern Present (08/15/2019)   [employer] of Occupational  Health - Occupational Stress Questionnaire    Feeling of Stress : Only a little  Social Connections: Not on file  Intimate Partner Violence: Not on file    Review of Systems: All other review of systems negative except as mentioned in the HPI.  Physical Exam: Vital signs BP (!) 113/59   Pulse 88   Temp 97.7 F (36.5 C)   Ht 5\' 5"  (1.651 m)   Wt 173 lb (78.5 kg)   SpO2 96%   BMI 28.79 kg/m   General:   Alert,  Well-developed, pleasant and cooperative in NAD Lungs:  Clear throughout to auscultation.   Heart:  Regular rate and rhythm Abdomen:  Soft, nontender and nondistended.   Neuro/Psych:  Alert and cooperative. Normal mood and affect. A and O x 3  Harlin Rain, MD [facility]

## 2023-07-15 NOTE — Progress Notes (Signed)
Uneventful anesthetic. Report to pacu rn. Vss. Care resumed by rn. 

## 2023-07-15 NOTE — Progress Notes (Signed)
Called to room to assist during endoscopic procedure.  Patient ID and intended procedure confirmed with present staff. Received instructions for my participation in the procedure from the performing physician.  

## 2023-07-16 ENCOUNTER — Telehealth: Payer: Self-pay | Admitting: *Deleted

## 2023-07-16 NOTE — Telephone Encounter (Signed)
  Follow up Call-     07/15/2023    1:49 PM 05/27/2023    7:29 AM  Call back number  Post procedure Call Back phone  # [phone]  Permission to leave phone message Yes Yes     Patient questions:   Message left to call us if necessary.

## 2023-07-20 ENCOUNTER — Encounter: Payer: Self-pay | Admitting: Internal Medicine

## 2023-07-20 ENCOUNTER — Ambulatory Visit: Payer: Medicare Other | Admitting: Internal Medicine

## 2023-07-20 ENCOUNTER — Telehealth: Payer: Self-pay | Admitting: Internal Medicine

## 2023-07-20 VITALS — BP 110/60 | HR 74 | Temp 97.7°F | Ht 65.0 in | Wt 174.0 lb

## 2023-07-20 DIAGNOSIS — L03011 Cellulitis of right finger: Secondary | ICD-10-CM | POA: Diagnosis not present

## 2023-07-20 DIAGNOSIS — Z23 Encounter for immunization: Secondary | ICD-10-CM

## 2023-07-20 DIAGNOSIS — Z87891 Personal history of nicotine dependence: Secondary | ICD-10-CM

## 2023-07-20 DIAGNOSIS — L089 Local infection of the skin and subcutaneous tissue, unspecified: Secondary | ICD-10-CM | POA: Diagnosis not present

## 2023-07-20 MED ORDER — DOXYCYCLINE HYCLATE 100 MG PO TABS: 100.0000 mg | ORAL_TABLET | Freq: Two times a day (BID) | ORAL | 0 refills | Status: DC

## 2023-07-20 MED ORDER — FLUCONAZOLE 150 MG PO TABS: 150.0000 mg | ORAL_TABLET | Freq: Once | ORAL | 1 refills | Status: AC

## 2023-07-20 MED ORDER — CEFTRIAXONE SODIUM 1 G IJ SOLR
1.0000 g | Freq: Once | INTRAMUSCULAR | Status: AC
Start: 2023-07-20 — End: 2023-07-20
  Administered 2023-07-20: 1 g via INTRAMUSCULAR

## 2023-07-20 MED ORDER — MUPIROCIN 2 % EX OINT: 1.0000 | TOPICAL_OINTMENT | Freq: Two times a day (BID) | CUTANEOUS | 0 refills | Status: DC

## 2023-07-20 NOTE — Progress Notes (Signed)
Patient Care Team: Lucky Cowboy, MD as PCP - General (Internal Medicine) Henreitta Leber, PA-C as Physician Assistant (Obstetrics and Gynecology)  Visit Date: 07/20/23  Subjective:    Patient ID: Candice [name] , Female   DOB: [birth year], 67 y.o.    MRN: [id number]   67 y.o. Female presents today for suspected skin infection in her right thumb near the nail. She was biting a hangnail on the right side of her right thumbnail 07/16/23 and started to have pain and pus on 07/18/23. She has not felt particularly well over the past weekend.  Accu-Chek blood sugar at 175 in-office today. She has just eaten part of her breakfast.  Last tetanus on 02/14/13.  Past Medical History:  Diagnosis Date   Anxiety    Diabetes mellitus without complication (HCC)    Hx of colonic polyps    Fhx Colon cancer   Hyperlipidemia    Hypocholesteremia    Tobacco abuse      Family History  Problem Relation Age of Onset   AAA (abdominal aortic aneurysm) Mother    Hyperlipidemia Mother    Colon cancer Sister 64 - 24       estimated by patient   Cancer Sister        Colon   Diabetes Sister    Heart disease Sister    Heart attack Brother 46   Brain cancer Maternal Grandmother 45   Heart attack Maternal Grandfather    Rectal cancer Neg Hx    Stomach cancer Neg Hx     Social history: She is married.  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  1 daughter.  2 grandchildren.     Review of Systems  Constitutional:  Negative for fever and malaise/fatigue.  HENT:  Negative for congestion.   Eyes:  Negative for blurred vision.  Respiratory:  Negative for cough and shortness of breath.   Cardiovascular:  Negative for chest pain, palpitations and leg swelling.  Gastrointestinal:  Negative for vomiting.  Musculoskeletal:  Negative for back pain.  Skin:  Negative for rash.       (+) Pain right thumb near nail  Neurological:  Negative for loss of consciousness and headaches.         Objective:   Vitals: BP 110/60   Pulse 74   Temp 97.7 F (36.5 C)   Ht 5\' 5"  (1.651 m)   Wt 174 lb (78.9 kg)   SpO2 98%   BMI 28.96 kg/m    Physical Exam Vitals and nursing note reviewed.  Constitutional:      General: She is not in acute distress.    Appearance: Normal appearance. She is not toxic-appearing.  HENT:     Head: Normocephalic and atraumatic.  Pulmonary:     Effort: Pulmonary effort is normal.  Skin:    General: Skin is warm and dry.     Comments: Paronychia right thumb with small collection of pus adjacent to lateral aspect of nail.  Neurological:     Mental Status: She is alert and oriented to person, place, and time. Mental status is at baseline.  Psychiatric:        Mood and Affect: Mood normal.        Behavior: Behavior normal.        Thought Content: Thought content normal.        Judgment: Judgment normal.      Results:   Studies obtained and personally reviewed by me:  Labs:       Component Value Date/Time   NA 140 06/10/2023 1032   K 5.2 06/10/2023 1032   CL 103 06/10/2023 1032   CO2 26 06/10/2023 1032   GLUCOSE 190 (H) 06/10/2023 1032   BUN 13 06/10/2023 1032   CREATININE 0.70 06/10/2023 1032   CALCIUM 9.9 06/10/2023 1032   PROT 7.2 06/10/2023 1032   ALBUMIN 4.5 04/08/2017 1002   AST 11 06/10/2023 1032   ALT 14 06/10/2023 1032   ALKPHOS 94 04/08/2017 1002   BILITOT 0.7 06/10/2023 1032   GFRNONAA 91 04/17/2021 1155   GFRAA 106 04/17/2021 1155     Lab Results  Component Value Date   WBC 7.1 06/10/2023   HGB 16.1 (H) 06/10/2023   HCT 48.2 (H) 06/10/2023   MCV 94.5 06/10/2023   PLT 222 06/10/2023    Lab Results  Component Value Date   CHOL 141 06/10/2023   HDL 42 (L) 06/10/2023   LDLCALC 70 06/10/2023   TRIG 217 (H) 06/10/2023   CHOLHDL 3.4 06/10/2023    Lab Results  Component Value Date   HGBA1C 8.7 (H) 06/10/2023     Lab Results  Component Value Date   TSH 1.96 11/12/2022      Assessment & Plan:    Paronychia right thumb: prescribed doxycycline 100 mg twice daily, Diflucan 150 mg once if needed for Candida vaginitis, mupirocin ointment. Administered Rocephin 1 g IM, tetanus booster. Wash with warm soapy water, apply mupirocin, cover twice daily. Contact us if symptoms worsen or do not improve.    I,Alexander Ruley,acting as a Neurosurgeon for Margaree Mackintosh, MD.,have documented all relevant documentation on the behalf of Margaree Mackintosh, MD,as directed by  Margaree Mackintosh, MD while in the presence of Margaree Mackintosh, MD.   I, Margaree Mackintosh, MD, have reviewed all documentation for this visit. The documentation on 07/20/23 for the exam, diagnosis, procedures, and orders are all accurate and complete.

## 2023-07-20 NOTE — Progress Notes (Signed)
Subjective:    [name] ID: Candice [name], female    DOB: 05/24/1956, 67 y.o.   MRN: [id number]  HPI 67 year old Female with Type 2 Diabetes mellitus has developed  paronychia right thumb over the weekend. No known injury. No fever, chills, headache, nausea or vomiting. Last tetanus vaccine was in 2014. Update given today. Reminded about influenza vaccine and diabetic eye exam.Last exam on file here in 2020 at [facility].  Hgb AIC in August was 8.7%. Is on low dose [name] and Glipizide by NP in Dr. Kathryne Sharper office.  Review of Systems no fever, chills, headache nausea or vomiting.     Objective:   Physical Exam VS reviewed. Pt is afebrile.   Afebrile today. Has small collection of pus adjacent to thumb nail that is not currently draining spontaneously.    NAD. Does not look ill.    Assessment & Plan:   Paronychia right thumb Type 2 Diabetes mellitus History of smoking-has been reminded to quit smoking.  Plan: Ceftriaxone 1 gram IM. Doxycycline 100 mg twice daily x 10 days. Soak thumb in warm soapy water for 20 minutes twice daily. Apply mupirocin ointment to paronychia twice daily.  Keep area covered with gauze until healed.  Tetanus immunization is up-to-date.  Diflucan given if develops Candida vaginitis while on antibiotics.

## 2023-07-20 NOTE — Telephone Encounter (Signed)
Candice [name] [phone]  Candice [name] has a infected right thumb, she would like to be seen, she has not felt well all weekend.

## 2023-07-20 NOTE — Patient Instructions (Signed)
Patient was given 1 g IM ceftriaxone in the office.  Started on doxycycline 100 mg twice daily for 10 days.  Soak thumb in warm soapy water for 20 minutes twice daily and apply mupirocin ointment to the area twice daily and keep it covered until healed.

## 2023-07-21 LAB — SURGICAL PATHOLOGY

## 2023-07-23 ENCOUNTER — Other Ambulatory Visit: Payer: Self-pay | Admitting: Nurse Practitioner

## 2023-08-12 ENCOUNTER — Other Ambulatory Visit: Payer: Self-pay

## 2023-08-12 DIAGNOSIS — E119 Type 2 diabetes mellitus without complications: Secondary | ICD-10-CM

## 2023-08-12 MED ORDER — OZEMPIC (1 MG/DOSE) 4 MG/3ML ~~LOC~~ SOPN: PEN_INJECTOR | SUBCUTANEOUS | 2 refills | Status: DC

## 2023-09-06 ENCOUNTER — Other Ambulatory Visit: Payer: Self-pay | Admitting: Internal Medicine

## 2023-09-07 ENCOUNTER — Other Ambulatory Visit: Payer: Self-pay | Admitting: Nurse Practitioner

## 2023-10-12 ENCOUNTER — Encounter: Payer: Self-pay | Admitting: Internal Medicine

## 2023-10-12 ENCOUNTER — Ambulatory Visit (INDEPENDENT_AMBULATORY_CARE_PROVIDER_SITE_OTHER): Payer: Medicare Other | Admitting: Internal Medicine

## 2023-10-12 VITALS — BP 100/60 | HR 74 | Temp 98.0°F | Ht 65.0 in | Wt 174.0 lb

## 2023-10-12 DIAGNOSIS — E119 Type 2 diabetes mellitus without complications: Secondary | ICD-10-CM | POA: Diagnosis not present

## 2023-10-12 DIAGNOSIS — R059 Cough, unspecified: Secondary | ICD-10-CM

## 2023-10-12 DIAGNOSIS — R0989 Other specified symptoms and signs involving the circulatory and respiratory systems: Secondary | ICD-10-CM

## 2023-10-12 DIAGNOSIS — Z87891 Personal history of nicotine dependence: Secondary | ICD-10-CM

## 2023-10-12 DIAGNOSIS — J22 Unspecified acute lower respiratory infection: Secondary | ICD-10-CM

## 2023-10-12 LAB — POC COVID19 BINAXNOW: SARS Coronavirus 2 Ag: NEGATIVE

## 2023-10-12 MED ORDER — DOXYCYCLINE HYCLATE 100 MG PO TABS: 100.0000 mg | ORAL_TABLET | Freq: Two times a day (BID) | ORAL | 0 refills | Status: DC

## 2023-10-12 MED ORDER — FLUCONAZOLE 150 MG PO TABS: 150.0000 mg | ORAL_TABLET | Freq: Once | ORAL | 0 refills | Status: AC

## 2023-10-12 MED ORDER — CEFTRIAXONE SODIUM 1 G IJ SOLR
1.0000 g | Freq: Once | INTRAMUSCULAR | Status: AC
Start: 2023-10-12 — End: 2023-10-12
  Administered 2023-10-12: 1 g via INTRAMUSCULAR

## 2023-10-12 NOTE — Progress Notes (Signed)
Patient Care Team: Lucky Cowboy, MD as PCP - General (Internal Medicine) Henreitta Leber, PA-C as Physician Assistant (Obstetrics and Gynecology)  Visit Date: 10/12/23  Subjective:   Patient ID: ZANIA WIGENT , Female   DOB: Feb 18, 1956, 67 y.o.    MRN: [id number]   67 y.o. Female presents today for sick visit. Patient has a past medical history of tobacco abuse. Today she endorses cough, wheezing, and congestion/rhinorrhea since Saturday, 12/28. Also her ears have been itching and popping with right ear soreness and vertigo when laying down and turing in bed. Denies sore throat or dizziness. Notes that she is allergic to [facility], which they have been burning in the fireplace.   Past Medical History:  Diagnosis Date   Anxiety    Diabetes mellitus without complication (HCC)    Hx of colonic polyps    Fhx Colon cancer   Hyperlipidemia    Hypocholesteremia    Tobacco abuse     Family History  Problem Relation Age of Onset   AAA (abdominal aortic aneurysm) Mother    Hyperlipidemia Mother    Colon cancer Sister 64 - 43       estimated by patient   Cancer Sister        Colon   Diabetes Sister    Heart disease Sister    Heart attack Brother 86   Brain cancer Maternal Grandmother 45   Heart attack Maternal Grandfather    Rectal cancer Neg Hx    Stomach cancer Neg Hx     Social Hx: Married. 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 One daughter. 2 grandchildren.     Review of Systems  Constitutional:  Negative for fever and malaise/fatigue.  HENT:  Positive for congestion and ear pain (right, mild).        (+) Rhinorrhea (+) Vertigo (laying down and turning)  Eyes:  Negative for blurred vision.  Respiratory:  Positive for cough and wheezing. Negative for shortness of breath.   Cardiovascular:  Negative for chest pain, palpitations and leg swelling.  Gastrointestinal:  Negative for vomiting.  Musculoskeletal:  Negative for back pain.  Skin:  Negative for rash.   Neurological:  Negative for loss of consciousness and headaches.        Objective:   Vitals: BP 100/60   Pulse 74   Temp 98 F (36.7 C)   Ht 5\' 5"  (1.651 m)   Wt 174 lb (78.9 kg)   SpO2 95%   BMI 28.96 kg/m    Physical Exam Vitals and nursing note reviewed.  Constitutional:      General: She is not in acute distress.    Appearance: Normal appearance. She is not toxic-appearing.  HENT:     Head: Normocephalic and atraumatic.     Ears:     Comments: fluid left ear < than right ear    Mouth/Throat:     Pharynx: Posterior oropharyngeal erythema present. No oropharyngeal exudate.     Tonsils: No tonsillar exudate.  Neck:     Comments: Cervical lymph nodes tender Pulmonary:     Effort: Pulmonary effort is normal.     Breath sounds: No rales.     Comments: Coarse breath sounds right-sided Skin:    General: Skin is warm and dry.  Neurological:     Mental Status: She is alert and oriented to person, place, and time. Mental status is at baseline.  Psychiatric:        Mood and Affect: Mood normal.  Behavior: Behavior normal.        Thought Content: Thought content normal.        Judgment: Judgment normal.       Results:   Studies obtained and personally reviewed by me:  Labs:      Component Value Date/Time   NA 140 06/10/2023 1032   K 5.2 06/10/2023 1032   CL 103 06/10/2023 1032   CO2 26 06/10/2023 1032   GLUCOSE 190 (H) 06/10/2023 1032   BUN 13 06/10/2023 1032   CREATININE 0.70 06/10/2023 1032   CALCIUM 9.9 06/10/2023 1032   PROT 7.2 06/10/2023 1032   ALBUMIN 4.5 04/08/2017 1002   AST 11 06/10/2023 1032   ALT 14 06/10/2023 1032   ALKPHOS 94 04/08/2017 1002   BILITOT 0.7 06/10/2023 1032   GFRNONAA 91 04/17/2021 1155   GFRAA 106 04/17/2021 1155   Lab Results  Component Value Date   WBC 7.1 06/10/2023   HGB 16.1 (H) 06/10/2023   HCT 48.2 (H) 06/10/2023   MCV 94.5 06/10/2023   PLT 222 06/10/2023   Lab Results  Component Value Date   CHOL  141 06/10/2023   HDL 42 (L) 06/10/2023   LDLCALC 70 06/10/2023   TRIG 217 (H) 06/10/2023   CHOLHDL 3.4 06/10/2023   Lab Results  Component Value Date   HGBA1C 8.7 (H) 06/10/2023   Lab Results  Component Value Date   TSH 1.96 11/12/2022      Assessment & Plan:   Acute lower respiratory infection-Cough: Received 1 g Rocephin. Sending in 100 mg Doxycycline - take 2 times daily. Also sending in 150 mg Diflucan in case of a yeast infection. Stay well rested and hydrated.   Type 2 diabetes mellitus treated with GLP-1 and Glipizide- Hgb AIC in August was 8.7%. Needs better control of DM  Low HDL  Hypertriglyceridemia- is on statin  Needs to consider coronary calcium scoring  History of smoking   I,Emily Lagle,acting as a scribe for Margaree Mackintosh, MD.,have documented all relevant documentation on the behalf of Margaree Mackintosh, MD,as directed by  Margaree Mackintosh, MD while in the presence of Margaree Mackintosh, MD. .I, Margaree Mackintosh, MD, have reviewed all documentation for this visit. The documentation on 10/12/23 for the exam, diagnosis, procedures, and orders are all accurate and complete.

## 2023-10-12 NOTE — Patient Instructions (Signed)
Diagnosed  with acute lower respiratory infection.  Today, was given 1 g IM Rocephin and started on doxycycline 100 mg twice daily by mouth for 10 days.  May take Diflucan in case of Candida vaginitis while on antibiotics.  Rest and stay well-hydrated.  Diabetic control needs to be tightened.  Consider returning to Endocrinologist.  She is on statin medication for hypertriglyceridemia.  Needs to quit smoking.  Needs to consider coronary calcium scoring.

## 2023-10-26 ENCOUNTER — Encounter: Payer: Self-pay | Admitting: Nurse Practitioner

## 2023-11-02 ENCOUNTER — Encounter: Payer: Medicare Other | Admitting: Nurse Practitioner

## 2023-11-04 ENCOUNTER — Encounter: Payer: Medicare Other | Admitting: Nurse Practitioner

## 2023-11-18 ENCOUNTER — Encounter: Payer: Medicare Other | Admitting: Nurse Practitioner

## 2023-12-21 ENCOUNTER — Other Ambulatory Visit: Payer: Medicare Other

## 2023-12-21 DIAGNOSIS — E119 Type 2 diabetes mellitus without complications: Secondary | ICD-10-CM

## 2023-12-21 DIAGNOSIS — Z87891 Personal history of nicotine dependence: Secondary | ICD-10-CM

## 2023-12-21 DIAGNOSIS — E781 Pure hyperglyceridemia: Secondary | ICD-10-CM

## 2023-12-21 DIAGNOSIS — Z1329 Encounter for screening for other suspected endocrine disorder: Secondary | ICD-10-CM

## 2023-12-22 LAB — COMPLETE METABOLIC PANEL WITH GFR
AG Ratio: 2 (calc) (ref 1.0–2.5)
ALT: 18 U/L (ref 6–29)
AST: 14 U/L (ref 10–35)
Albumin: 4.9 g/dL (ref 3.6–5.1)
Alkaline phosphatase (APISO): 108 U/L (ref 37–153)
BUN: 16 mg/dL (ref 7–25)
CO2: 22 mmol/L (ref 20–32)
Calcium: 10.1 mg/dL (ref 8.6–10.4)
Chloride: 102 mmol/L (ref 98–110)
Creat: 0.75 mg/dL (ref 0.50–1.05)
Globulin: 2.4 g/dL (ref 1.9–3.7)
Glucose, Bld: 202 mg/dL — ABNORMAL HIGH (ref 65–99)
Potassium: 4.8 mmol/L (ref 3.5–5.3)
Sodium: 138 mmol/L (ref 135–146)
Total Bilirubin: 0.4 mg/dL (ref 0.2–1.2)
Total Protein: 7.3 g/dL (ref 6.1–8.1)
eGFR: 88 mL/min/{1.73_m2} (ref >=60)

## 2023-12-22 LAB — LIPID PANEL
Cholesterol: 140 mg/dL (ref <200)
HDL: 44 mg/dL — ABNORMAL LOW (ref >=50)
LDL Cholesterol (Calc): 61 mg/dL
Non-HDL Cholesterol (Calc): 96 mg/dL (ref <130)
Total CHOL/HDL Ratio: 3.2 (calc) (ref <5.0)
Triglycerides: 337 mg/dL — ABNORMAL HIGH (ref <150)

## 2023-12-22 LAB — CBC WITH DIFFERENTIAL/PLATELET
Absolute Lymphocytes: 1875 {cells}/uL (ref 850–3900)
Absolute Monocytes: 671 {cells}/uL (ref 200–950)
Basophils Absolute: 69 {cells}/uL (ref 0–200)
Basophils Relative: 0.8 %
Eosinophils Absolute: 138 {cells}/uL (ref 15–500)
Eosinophils Relative: 1.6 %
HCT: 47.9 % — ABNORMAL HIGH (ref 35.0–45.0)
Hemoglobin: 16 g/dL — ABNORMAL HIGH (ref 11.7–15.5)
MCH: 31.6 pg (ref 27.0–33.0)
MCHC: 33.4 g/dL (ref 32.0–36.0)
MCV: 94.5 fL (ref 80.0–100.0)
MPV: 11.1 fL (ref 7.5–12.5)
Monocytes Relative: 7.8 %
Neutro Abs: 5848 {cells}/uL (ref 1500–7800)
Neutrophils Relative %: 68 %
Platelets: 244 10*3/uL (ref 140–400)
RBC: 5.07 10*6/uL (ref 3.80–5.10)
RDW: 12.2 % (ref 11.0–15.0)
Total Lymphocyte: 21.8 %
WBC: 8.6 10*3/uL (ref 3.8–10.8)

## 2023-12-22 LAB — HEMOGLOBIN A1C
Hgb A1c MFr Bld: 9.1 %{Hb} — ABNORMAL HIGH (ref <5.7)
Mean Plasma Glucose: 214 mg/dL
eAG (mmol/L): 11.9 mmol/L

## 2023-12-22 LAB — TSH: TSH: 1.1 m[IU]/L (ref 0.40–4.50)

## 2023-12-22 LAB — MICROALBUMIN / CREATININE URINE RATIO
Creatinine, Urine: 49 mg/dL (ref 20–275)
Microalb Creat Ratio: 4 mg/g{creat} (ref <30)
Microalb, Ur: 0.2 mg/dL

## 2023-12-23 ENCOUNTER — Encounter: Payer: Medicare Other | Admitting: Nurse Practitioner

## 2023-12-23 ENCOUNTER — Ambulatory Visit: Payer: Medicare Other | Admitting: Internal Medicine

## 2023-12-23 ENCOUNTER — Encounter: Payer: Self-pay | Admitting: Internal Medicine

## 2023-12-23 VITALS — BP 110/80 | HR 77 | Wt 173.0 lb

## 2023-12-23 DIAGNOSIS — Z860101 Personal history of adenomatous and serrated colon polyps: Secondary | ICD-10-CM | POA: Diagnosis not present

## 2023-12-23 DIAGNOSIS — Z780 Asymptomatic menopausal state: Secondary | ICD-10-CM

## 2023-12-23 DIAGNOSIS — Z13820 Encounter for screening for osteoporosis: Secondary | ICD-10-CM | POA: Diagnosis not present

## 2023-12-23 DIAGNOSIS — Z87898 Personal history of other specified conditions: Secondary | ICD-10-CM

## 2023-12-23 DIAGNOSIS — Z87891 Personal history of nicotine dependence: Secondary | ICD-10-CM

## 2023-12-23 DIAGNOSIS — E119 Type 2 diabetes mellitus without complications: Secondary | ICD-10-CM | POA: Diagnosis not present

## 2023-12-23 DIAGNOSIS — Z23 Encounter for immunization: Secondary | ICD-10-CM

## 2023-12-23 DIAGNOSIS — E781 Pure hyperglyceridemia: Secondary | ICD-10-CM

## 2023-12-23 DIAGNOSIS — F439 Reaction to severe stress, unspecified: Secondary | ICD-10-CM

## 2023-12-23 DIAGNOSIS — Z0000 Encounter for general adult medical examination without abnormal findings: Secondary | ICD-10-CM

## 2023-12-23 DIAGNOSIS — L309 Dermatitis, unspecified: Secondary | ICD-10-CM

## 2023-12-23 MED ORDER — NYSTATIN-TRIAMCINOLONE 100000-0.1 UNIT/GM-% EX OINT: 1.0000 | TOPICAL_OINTMENT | Freq: Two times a day (BID) | CUTANEOUS | 0 refills | Status: AC

## 2023-12-23 NOTE — Patient Instructions (Addendum)
 It was a pleasure to see you today.  Please make appointment for diabetic eye exam.  Placing order for coronary calcium scoring which will include a CT scan to screen for lung cancer.  Please see GYN for cervical cancer screening.  Need to have bone density study.  Recommend Shingrix vaccines.  Referral to [facility] endocrinology for diabetic care.  We would like to follow-up with you in 6 months.

## 2023-12-23 NOTE — Progress Notes (Unsigned)
 Subjective:    [name] ID: Candice [name], female    DOB: 1955/11/09, 68 y.o.   MRN: [id number]  HPI Pleasant 68 year old Female in today for Welcome to Medicare physical exam and evaluation of medical issues.  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  She is worked here for a number of years.  She is transferring from Dr. Kathryne Sharper office as his office is being closed since he recently passed away.  She has a longstanding history of type 2 diabetes mellitus.  She is a former 20-pack-year smoker and quit in 2019.  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  History of hypertriglyceridemia and polycythemia.  History of B12 and vitamin D deficiencies.  Suggesting that she have coronary calcium scoring which will include a lung CT that she is a smoker.  Recent fasting glucose was 202.  Kidney functions are stable and normal.  Electrolytes are normal.  Liver functions are normal.  However, hemoglobin A1c is 9.1% and was 8.7% in August 2024.  Hemoglobin is generally elevated in the 16 range and on March 10 was 16.0 g  [name] had colonoscopy by Dr. Adela Lank in October 2024.  11 polyps were removed.  These polyps were tubular adenomas.  Repeat colonoscopy recommended in 1 year.  Genetic counseling was recommended due to family history of colon cancer and number of polyps.  Social history: Married.  1 daughter.  1 granddaughter and 1 grandson.  Husband is retired from [employer].  Attempted to obtain Welcome to Medicare EKG but we had equipment failure with the EKG machine.  This will be done at a later date.  Review of Systems [name] complains of a rash on her left trunk that is itchy.     Objective:   Physical Exam Blood pressure 110/80, pulse 77 and regular, weight 173 pounds Skin warm and dry.  Has rash on the left lateral trunk area with some superficial excoriations and erythema.  Could be of fungal origin.  Will treat  with Mycolog ointment.  No cervical adenopathy.  TMs clear.  Pharynx is clear.  Neck is supple.  No thyromegaly.  No carotid bruits.  Chest clear.  Cardiac exam: Regular rate and rhythm without murmur or ectopy.  Abdomen is soft nondistended without hepatosplenomegaly masses or tenderness.  No lower extremity pitting edema.  Neurological exam is intact without focal deficits.      Assessment & Plan:   Hypertriglyceridemia-likely due to diabetes mellitus.  Fasting triglycerides are 337.  She would benefit from statin therapy.  Has been prescribed rosuvastatin 40 mg daily by previous provider.  History of smoking-needs to quit.  Needs lung cancer screening.  It might be most prudent to have her do coronary calcium scoring which would include lung CT in addition to coronary calcium score.  Will put in an order for [facility].  Health maintenance: Needs to have Shingrix vaccine.  Needs annual diabetic eye exam.  Needs to see GYN physician for exam.  Tetanus immunization is up-to-date.  Type 2 diabetes mellitus- currently on [name] 25 mg daily and glipizide 5 mg prescribed to take 2 tablets before breakfast and 1 or 2 tablets before midnight.  Was prescribed Ozempic in October.  History of situational stress with family discussed.  Currently on Celexa 40 mg daily.  History of vertigo treated with Antivert  History of reactive airways disease treated with Singulair  Plan: Recommend coronary calcium  scoring which will include CT of lung as well as coronary calcium score with longstanding history of diabetes and hyperlipidemia.  Advised to quit smoking.  Needs to have Shingrix vaccine, GYN exam and annual diabetic eye exam.  Also, I think it would be prudent to have her seen by Endocrinology with longstanding history of diabetes.  Referral will be made to [facility] Endocrinology.  Situational stress discussed.  Does not want to go to counseling.  Continue Celexa.  For nonspecific rash left trunk she  will use Mycolog cream 2-3 times daily and if not resolving will be seen by Dermatologist.  Vaccines discussed.  Needs to have repeat colonoscopy with history of multiple adenomatous polyps.  Plan to follow-up in 6 months here.

## 2023-12-30 ENCOUNTER — Telehealth: Payer: Self-pay | Admitting: Internal Medicine

## 2023-12-30 ENCOUNTER — Encounter: Payer: Self-pay | Admitting: Internal Medicine

## 2023-12-30 DIAGNOSIS — E119 Type 2 diabetes mellitus without complications: Secondary | ICD-10-CM

## 2023-12-30 MED ORDER — OZEMPIC (1 MG/DOSE) 4 MG/3ML ~~LOC~~ SOPN
PEN_INJECTOR | SUBCUTANEOUS | 2 refills | Status: DC
Start: 2023-12-30 — End: 2024-04-11

## 2023-12-30 MED ORDER — OZEMPIC (1 MG/DOSE) 4 MG/3ML ~~LOC~~ SOPN: PEN_INJECTOR | SUBCUTANEOUS | 2 refills | Status: DC

## 2023-12-30 NOTE — Telephone Encounter (Addendum)
 Received Fax RX request from  Pharmacy -   [facility] [id number] [place], [place] - [id number] Phone: [phone]  Fax: [phone]      Medication - Semaglutide, 1 MG/DOSE, (OZEMPIC, 1 MG/DOSE,) 4 MG/3ML SOPN     Last Refill - 11/23/23  Last OV - 12/23/2023  Last CPE - 12/23/2023  Next Appointment -  3 months if not seen by Endocrine. Referral being made to Endocrine for management of DM

## 2024-01-19 ENCOUNTER — Ambulatory Visit: Admitting: Internal Medicine

## 2024-01-19 VITALS — BP 98/60 | HR 72 | Temp 97.1°F | Ht 65.0 in | Wt 175.0 lb

## 2024-01-19 DIAGNOSIS — H6503 Acute serous otitis media, bilateral: Secondary | ICD-10-CM

## 2024-01-19 DIAGNOSIS — J069 Acute upper respiratory infection, unspecified: Secondary | ICD-10-CM

## 2024-01-19 MED ORDER — DOXYCYCLINE HYCLATE 100 MG PO TABS: 100.0000 mg | ORAL_TABLET | Freq: Two times a day (BID) | ORAL | 0 refills | Status: DC

## 2024-01-19 MED ORDER — BENZONATATE 100 MG PO CAPS: 100.0000 mg | ORAL_CAPSULE | Freq: Three times a day (TID) | ORAL | 0 refills | Status: AC | PRN

## 2024-01-19 NOTE — Progress Notes (Deleted)
 Patient stated cold like symptoms

## 2024-01-19 NOTE — Progress Notes (Addendum)
 Patient Care Team: Candice Mackintosh, MD as PCP - General (Internal Medicine) Henreitta Leber, PA-C as Physician Assistant (Obstetrics and Gynecology)  Visit Date: 01/19/24  Subjective:   Chief Complaint  Patient presents with   Sinus Problem  Patient WU:JWJX Candice [name],Female DOB:01-08-1956,67 y.o. BJY:[id number]   68 y.o. Female presents today for acute sick visit with Sinus Congestion. Says that on Saturday her throat began to hurt, her ears have begun to ache, and she has also been sneezing, though has not had any discolored mucus. Past Medical History:  Diagnosis Date   Anxiety    Diabetes mellitus without complication (HCC)    Hx of colonic polyps    Fhx Colon cancer   Hyperlipidemia    Hypocholesteremia    Tobacco abuse     Allergies  Allergen Reactions   Levaquin [Levofloxacin] Hives and Other (See Comments)    Tingling sensation to mouth, near LOC, blurred vision,     Iodine     Unknown reaction    Levofloxacin In D5w Hives    Hives   Metformin Diarrhea   Metformin And Related Diarrhea   Shellfish Allergy     Unknown reaction    Amoxicillin Rash   Egg-Derived Products     Low, by skin test only, local itching when injected    Kombiglyze [Saxagliptin-Metformin Er] Other (See Comments)    Bloating   Latex Itching and Rash   Penicillins Rash    Family History  Problem Relation Age of Onset   AAA (abdominal aortic aneurysm) Mother    Hyperlipidemia Mother    Colon cancer Sister 66 - 19       estimated by patient   Cancer Sister        Colon   Diabetes Sister    Heart disease Sister    Heart attack Brother 70   Brain cancer Maternal Grandmother 45   Heart attack Maternal Grandfather    Rectal cancer Neg Hx    Stomach cancer Neg Hx    Social History   Social History Narrative   Not on file   Review of Systems  HENT:  Positive for congestion (/sneezing; sinus), ear pain and sore throat.      Objective:  Vitals: BP 98/60   Pulse 72   Temp (!)  97.1 F (36.2 C) (Temporal)   Ht 5\' 5"  (1.651 m)   Wt 175 lb (79.4 kg)   SpO2 96%   BMI 29.12 kg/m   Physical Exam Vitals and nursing note reviewed.  Constitutional:      General: She is not in acute distress.    Appearance: Normal appearance. She is not ill-appearing.  HENT:     Head: Normocephalic and atraumatic.     Right Ear: Ear canal and external ear normal. Tympanic membrane is erythematous (pink).     Left Ear: Ear canal and external ear normal. Tympanic membrane is erythematous (pink).     Ears:     Comments: RIGHT TM: Fluid, Pink w/ Splayed Light Reflex LEFT TM: Similar to right but less severe.     Mouth/Throat:     Mouth: Mucous membranes are moist.     Pharynx: Oropharynx is clear. No oropharyngeal exudate or posterior oropharyngeal erythema.  Pulmonary:     Effort: Pulmonary effort is normal.     Breath sounds: Normal breath sounds. No wheezing, rhonchi or rales.  Lymphadenopathy:     Cervical: No cervical adenopathy.  Skin:    General: Skin is  warm and dry.  Neurological:     Mental Status: She is alert and oriented to person, place, and time. Mental status is at baseline.  Psychiatric:        Mood and Affect: Mood normal.        Behavior: Behavior normal.        Thought Content: Thought content normal.        Judgment: Judgment normal.     Results:  Studies Obtained And Personally Reviewed By Me: Labs:     Component Value Date/Time   NA 138 12/21/2023 1047   K 4.8 12/21/2023 1047   CL 102 12/21/2023 1047   CO2 22 12/21/2023 1047   GLUCOSE 202 (H) 12/21/2023 1047   BUN 16 12/21/2023 1047   CREATININE 0.75 12/21/2023 1047   CALCIUM 10.1 12/21/2023 1047   PROT 7.3 12/21/2023 1047   ALBUMIN 4.5 04/08/2017 1002   AST 14 12/21/2023 1047   ALT 18 12/21/2023 1047   ALKPHOS 94 04/08/2017 1002   BILITOT 0.4 12/21/2023 1047   GFRNONAA 91 04/17/2021 1155   GFRAA 106 04/17/2021 1155    Lab Results  Component Value Date   WBC 8.6 12/21/2023   HGB  16.0 (H) 12/21/2023   HCT 47.9 (H) 12/21/2023   MCV 94.5 12/21/2023   PLT 244 12/21/2023   Lab Results  Component Value Date   CHOL 140 12/21/2023   HDL 44 (L) 12/21/2023   LDLCALC 61 12/21/2023   TRIG 337 (H) 12/21/2023   CHOLHDL 3.2 12/21/2023   Lab Results  Component Value Date   HGBA1C 9.1 (H) 12/21/2023    Lab Results  Component Value Date   TSH 1.10 12/21/2023   Assessment & Plan:   Acute Upper Respiratory Infection: Sending in 100 mg Doxycycline - take 1 tablet (100 mg total) by mouth 2 (two) times daily. Stay well rested, well hydrated, and well nourished. Contact us if symptoms worsen/persist despite treatment.    Acute bilateral serous otitis media   I,Emily Lagle,acting as a scribe for Candice Mackintosh, MD.,have documented all relevant documentation on the behalf of Candice Mackintosh, MD,as directed by  Candice Mackintosh, MD while in the presence of Candice Mackintosh, MD.   I, Candice Mackintosh, MD, have reviewed all documentation for this visit. The documentation on 01/19/24 for the exam, diagnosis, procedures, and orders are all accurate and complete.

## 2024-01-19 NOTE — Patient Instructions (Addendum)
 Take 100 mg of doxycycline twice daily for 10 days.  Rest and stay well-hydrated.  May take Tessalon Perles 100 mg 3 times daily as needed for cough.

## 2024-01-19 NOTE — Progress Notes (Signed)
   Acute Office Visit  Subjective:     Patient ID: Candice [name], female    DOB: [birth year], 69 y.o.   MRN: [id number]  Chief Complaint  Patient presents with   Sinus Problem   URI  Onset  over the past 24 hours with respiratory infection symptoms including nasal congestion and cough. No vomiting. No shaking chills.  She had [facility] to [facility] visit in March.She has Type 2 DM, quit smoking in 2019. Has CKD stage II thought to be due to DM. Has polycythemia and hypertriglyceridemia. Hx B12 and Vitamin D deficiencies.  Have recommended lung cancer screening. Needs to have diabetic eye exam. Declines Covid vaccine. Suggest pneumovax 20 when feeler well again.  ROS No shaking chills or vomiting.      Objective:    BP 98/60   Pulse 72   Temp (!) 97.1 F (36.2 C) (Temporal)   Ht 5\' 5"  (1.651 m)   Wt 175 lb (79.4 kg)   SpO2 96%   BMI 29.12 kg/m    Physical Exam  Pharynx slighlty injected. TMs are dull bilaterally   Neck Supple. Chest: no rales.      Assessment & Plan:   Non recurrent bilateral otitis media Acute upper respiratory infection   Meds ordered this encounter  Medications   doxycycline (VIBRA-TABS) 100 MG tablet    Sig: Take 1 tablet (100 mg total) by mouth 2 (two) times daily.    Dispense:  20 tablet    Refill:  0   benzonatate (TESSALON) 100 MG capsule    Sig: Take 1 capsule (100 mg total) by mouth 3 (three) times daily as needed.    Dispense:  30 capsule    Refill:  0    Return if symptoms worsen or fail to improve.   ISylvan Evener, MD, have reviewed all documentation for this visit. The documentation on 01/24/24 for the exam, diagnosis, procedures, and orders are all accurate and complete.

## 2024-01-24 ENCOUNTER — Encounter: Payer: Self-pay | Admitting: Internal Medicine

## 2024-01-26 ENCOUNTER — Encounter: Payer: Self-pay | Admitting: Internal Medicine

## 2024-01-26 ENCOUNTER — Telehealth: Payer: Self-pay | Admitting: Internal Medicine

## 2024-01-26 DIAGNOSIS — B3731 Acute candidiasis of vulva and vagina: Secondary | ICD-10-CM

## 2024-01-26 MED ORDER — FLUCONAZOLE 150 MG PO TABS: 150.0000 mg | ORAL_TABLET | Freq: Once | ORAL | 1 refills | Status: AC

## 2024-01-26 NOTE — Telephone Encounter (Signed)
 Needs Diflucan 150mg  tab with 1 refill.  MJB, MD

## 2024-04-11 ENCOUNTER — Other Ambulatory Visit: Payer: Self-pay

## 2024-04-11 DIAGNOSIS — E119 Type 2 diabetes mellitus without complications: Secondary | ICD-10-CM

## 2024-04-11 MED ORDER — OZEMPIC (1 MG/DOSE) 4 MG/3ML ~~LOC~~ SOPN: PEN_INJECTOR | SUBCUTANEOUS | 2 refills | Status: DC

## 2024-04-22 ENCOUNTER — Other Ambulatory Visit: Payer: Self-pay

## 2024-04-22 DIAGNOSIS — E119 Type 2 diabetes mellitus without complications: Secondary | ICD-10-CM

## 2024-04-22 MED ORDER — OZEMPIC (1 MG/DOSE) 4 MG/3ML ~~LOC~~ SOPN: PEN_INJECTOR | SUBCUTANEOUS | 11 refills | Status: AC

## 2024-05-05 ENCOUNTER — Ambulatory Visit: Payer: Self-pay | Admitting: Internal Medicine

## 2024-05-05 ENCOUNTER — Ambulatory Visit: Admitting: Internal Medicine

## 2024-05-05 ENCOUNTER — Encounter: Payer: Self-pay | Admitting: Internal Medicine

## 2024-05-05 VITALS — BP 120/80 | HR 80 | Temp 97.7°F | Ht 65.0 in | Wt 173.0 lb

## 2024-05-05 DIAGNOSIS — Z7984 Long term (current) use of oral hypoglycemic drugs: Secondary | ICD-10-CM

## 2024-05-05 DIAGNOSIS — E119 Type 2 diabetes mellitus without complications: Secondary | ICD-10-CM | POA: Diagnosis not present

## 2024-05-05 DIAGNOSIS — N390 Urinary tract infection, site not specified: Secondary | ICD-10-CM

## 2024-05-05 DIAGNOSIS — R300 Dysuria: Secondary | ICD-10-CM

## 2024-05-05 DIAGNOSIS — R319 Hematuria, unspecified: Secondary | ICD-10-CM | POA: Diagnosis not present

## 2024-05-05 DIAGNOSIS — Z87891 Personal history of nicotine dependence: Secondary | ICD-10-CM | POA: Diagnosis not present

## 2024-05-05 LAB — POCT URINALYSIS DIP (CLINITEK)
Bilirubin, UA: NEGATIVE
Glucose, UA: >=1000 mg/dL — AB
Ketones, POC UA: NEGATIVE mg/dL
Leukocytes, UA: NEGATIVE
Nitrite, UA: POSITIVE — AB
POC PROTEIN,UA: NEGATIVE
Spec Grav, UA: 1.01 (ref 1.010–1.025)
Urobilinogen, UA: 0.2 U/dL
pH, UA: 6 (ref 5.0–8.0)

## 2024-05-05 MED ORDER — FLUCONAZOLE 150 MG PO TABS
150.0000 mg | ORAL_TABLET | Freq: Once | ORAL | 1 refills | Status: AC
Start: 2024-05-05 — End: 2024-05-05

## 2024-05-05 MED ORDER — DOXYCYCLINE HYCLATE 100 MG PO TABS: 100.0000 mg | ORAL_TABLET | Freq: Two times a day (BID) | ORAL | 0 refills | Status: DC

## 2024-05-05 NOTE — Patient Instructions (Addendum)
 She has hematuria with positive nitrite and likely has urinary tract infection since Nitrite is produced by bacteria. Culture has been sent. In addition, she has history of poorly controlled diabetes and needs to see Endocrinologist. She should consider lung cancer screening with history of smoking and need follow up colonoscopy. She should call for colonoscopy appointment. She does not appear septic. We have prescribed Doxycycline  for 7 days. Diflucan  has been prescribed in case she develops Candida vaginitis while on antibiotics. Will need follow up urine specimen and nurse visit in 7-10 days.

## 2024-05-05 NOTE — Progress Notes (Signed)
 Patient Care Team: Perri Ronal PARAS, MD as PCP - General (Internal Medicine) Perri Bjork, PA-C as Physician Assistant (Obstetrics and Gynecology)  Visit Date: 05/05/24  Subjective:   Chief Complaint  Patient presents with   Hematuria        Dysuria    Started yesterday morning.   Patient PI:Gnwp D Sardina,Female DOB:[birth year],68 y.o. FMW:[id number]   68 y.o.Female presents today for acute sick visit with Hematuria; Dysuria, both which reportedly began yesterday morning. Patient has a past medical history of Diabetes Mellitus, type II w/ A1c 9.1 in 12/2023. Denies fever/chills, back pain, or nausea/vomiting. Says other associated symptoms are: bladder pressure, malodor, and vaginal itching.   Past Medical History:  Diagnosis Date   Anxiety    Diabetes mellitus without complication (HCC)    Hx of colonic polyps    Fhx Colon cancer   Hyperlipidemia    Hypocholesteremia    Tobacco abuse     Allergies  Allergen Reactions   Levaquin [Levofloxacin] Hives and Other (See Comments)    Tingling sensation to mouth, near LOC, blurred vision,     Iodine     Unknown reaction    Levofloxacin In D5w Hives    Hives   Metformin  Diarrhea   Metformin  And Related Diarrhea   Shellfish Allergy     Unknown reaction    Amoxicillin Rash   Egg-Derived Products     Low, by skin test only, local itching when injected    Kombiglyze [Saxagliptin-Metformin  Er] Other (See Comments)    Bloating   Latex Itching and Rash   Penicillins Rash    Family History  Problem Relation Age of Onset   AAA (abdominal aortic aneurysm) Mother    Hyperlipidemia Mother    Colon cancer Sister 3 - 12       estimated by patient   Cancer Sister        Colon   Diabetes Sister    Heart disease Sister    Heart attack Brother 16   Brain cancer Maternal Grandmother 45   Heart attack Maternal Grandfather    Rectal cancer Neg Hx    Stomach cancer Neg Hx    Social History   Social History Narrative   Not  on file   Review of Systems  Gastrointestinal:  Negative for nausea and vomiting.  Genitourinary:  Positive for dysuria and hematuria. Negative for flank pain, frequency and urgency.       (+) Bladder Pressure (+) Urine Malodor  (+) Vaginal Pruritus  Musculoskeletal:  Negative for back pain.     Objective:  Vitals: BP 120/80   Pulse 80   Temp 97.7 F (36.5 C)   Ht 5' 5 (1.651 m)   Wt 173 lb (78.5 kg)   SpO2 97%   BMI 28.79 kg/m   Physical Exam Vitals and nursing note reviewed.  Constitutional:      General: She is not in acute distress.    Appearance: Normal appearance. She is not toxic-appearing.  HENT:     Head: Normocephalic and atraumatic.  Pulmonary:     Effort: Pulmonary effort is normal.  Skin:    General: Skin is warm and dry.  Neurological:     Mental Status: She is alert and oriented to person, place, and time. Mental status is at baseline.  Psychiatric:        Mood and Affect: Mood normal.        Behavior: Behavior normal.  Thought Content: Thought content normal.        Judgment: Judgment normal.     Results:  Studies Obtained And Personally Reviewed By Me: Labs:     Component Value Date/Time   NA 138 12/21/2023 1047   K 4.8 12/21/2023 1047   CL 102 12/21/2023 1047   CO2 22 12/21/2023 1047   GLUCOSE 202 (H) 12/21/2023 1047   BUN 16 12/21/2023 1047   CREATININE 0.75 12/21/2023 1047   CALCIUM  10.1 12/21/2023 1047   PROT 7.3 12/21/2023 1047   ALBUMIN 4.5 04/08/2017 1002   AST 14 12/21/2023 1047   ALT 18 12/21/2023 1047   ALKPHOS 94 04/08/2017 1002   BILITOT 0.4 12/21/2023 1047   GFRNONAA 91 04/17/2021 1155   GFRAA 106 04/17/2021 1155    Lab Results  Component Value Date   WBC 8.6 12/21/2023   HGB 16.0 (H) 12/21/2023   HCT 47.9 (H) 12/21/2023   MCV 94.5 12/21/2023   PLT 244 12/21/2023   Lab Results  Component Value Date   CHOL 140 12/21/2023   HDL 44 (L) 12/21/2023   LDLCALC 61 12/21/2023   TRIG 337 (H) 12/21/2023   CHOLHDL  3.2 12/21/2023   Lab Results  Component Value Date   HGBA1C 9.1 (H) 12/21/2023    Lab Results  Component Value Date   TSH 1.10 12/21/2023    Results for orders placed or performed in visit on 05/05/24  POCT URINALYSIS DIP (CLINITEK)  Result Value Ref Range   Color, UA yellow yellow   Clarity, UA cloudy (A) clear   Glucose, UA >=1,000 (A) negative mg/dL   Bilirubin, UA negative negative   Ketones, POC UA negative negative mg/dL   Spec Grav, UA 8.989 8.989 - 1.025   Blood, UA large (A) negative   pH, UA 6.0 5.0 - 8.0   POC PROTEIN,UA negative negative, trace   Urobilinogen, UA 0.2 0.2 or 1.0 E.U./dL   Nitrite, UA Positive (A) Negative   Leukocytes, UA Negative Negative   Assessment & Plan:   Meds ordered this encounter  Medications   fluconazole  (DIFLUCAN ) 150 MG tablet    Sig: Take 1 tablet (150 mg total) by mouth once for 1 dose.    Dispense:  1 tablet    Refill:  1   doxycycline  (VIBRA -TABS) 100 MG tablet    Sig: Take 1 tablet (100 mg total) by mouth 2 (two) times daily.    Dispense:  14 tablet    Refill:  0   Orders Placed This Encounter  Procedures   Urine Culture   POCT URINALYSIS DIP (CLINITEK)   Hematuria; Dysuria, both which reportedly began yesterday morning. Denies fever/chills, back pain, or nausea/vomiting. Other associated symptoms are: bladder pressure, malodor, and vaginal itching. UA cloudy with large occult blood, positive nitrites, and glycosuria - sending for culture. Sending in 100 mg Doxycycline  - take 1 tablet (100 mg total) by mouth 2 (two) times daily for 7 days and 150 mg Diflucan   tablet in case she develops Candida vaginitis with 1 refill to take if necessary.   Diabetes Mellitus, type II w/ A1c 9.1% in 12/2023. Takes Glipizide  5-10 mg twice daily (10 mg before breakfast and 5-10 mg before midnight), [name]  25 mg daily, and Ozempic  1 mg injected weekly. Needs diabetic eye exam and bone density study. Needs follow up colonoscopy. Needs 6 month  recheck here in September with Hgb AIC    I,Emily Lagle,acting as a scribe for Ronal JINNY Hailstone, MD.,have documented  all relevant documentation on the behalf of Ronal JINNY Hailstone, MD,as directed by  Ronal JINNY Hailstone, MD while in the presence of Ronal JINNY Hailstone, MD.   I, Ronal JINNY Hailstone, MD, have reviewed all documentation for this visit. The documentation on 05/05/24 for the exam, diagnosis, procedures, and orders are all accurate and complete.

## 2024-05-08 LAB — URINE CULTURE
MICRO NUMBER:: 16741097
SPECIMEN QUALITY:: ADEQUATE

## 2024-05-09 ENCOUNTER — Ambulatory Visit (INDEPENDENT_AMBULATORY_CARE_PROVIDER_SITE_OTHER)

## 2024-05-09 ENCOUNTER — Ambulatory Visit: Payer: Self-pay | Admitting: Internal Medicine

## 2024-05-09 VITALS — BP 110/62 | HR 76 | Temp 98.0°F | Ht 65.0 in | Wt 173.0 lb

## 2024-05-09 DIAGNOSIS — B9620 Unspecified Escherichia coli [E. coli] as the cause of diseases classified elsewhere: Secondary | ICD-10-CM | POA: Diagnosis not present

## 2024-05-09 DIAGNOSIS — N390 Urinary tract infection, site not specified: Secondary | ICD-10-CM

## 2024-05-09 LAB — POCT URINALYSIS DIP (CLINITEK)
Bilirubin, UA: NEGATIVE
Glucose, UA: >=1000 mg/dL — AB
Ketones, POC UA: NEGATIVE mg/dL
Leukocytes, UA: NEGATIVE
Nitrite, UA: NEGATIVE
POC PROTEIN,UA: NEGATIVE
Spec Grav, UA: 1.015 (ref 1.010–1.025)
Urobilinogen, UA: 0.2 U/dL
pH, UA: 6.5 (ref 5.0–8.0)

## 2024-05-09 MED ORDER — CEFTRIAXONE SODIUM 1 G IJ SOLR
1.0000 g | Freq: Once | INTRAMUSCULAR | Status: AC
  Administered 2024-05-09: 1 g via INTRAMUSCULAR

## 2024-06-19 ENCOUNTER — Other Ambulatory Visit: Payer: Self-pay | Admitting: Internal Medicine

## 2024-06-22 LAB — HM DIABETES EYE EXAM

## 2024-06-23 ENCOUNTER — Encounter: Payer: Self-pay | Admitting: Internal Medicine

## 2024-08-01 ENCOUNTER — Ambulatory Visit (INDEPENDENT_AMBULATORY_CARE_PROVIDER_SITE_OTHER): Admitting: Internal Medicine

## 2024-08-01 ENCOUNTER — Encounter: Payer: Self-pay | Admitting: Internal Medicine

## 2024-08-01 VITALS — BP 100/60 | HR 78 | Temp 98.0°F | Ht 65.0 in | Wt 173.0 lb

## 2024-08-01 DIAGNOSIS — E119 Type 2 diabetes mellitus without complications: Secondary | ICD-10-CM

## 2024-08-01 DIAGNOSIS — Z7984 Long term (current) use of oral hypoglycemic drugs: Secondary | ICD-10-CM | POA: Diagnosis not present

## 2024-08-01 DIAGNOSIS — E1169 Type 2 diabetes mellitus with other specified complication: Secondary | ICD-10-CM

## 2024-08-01 DIAGNOSIS — J22 Unspecified acute lower respiratory infection: Secondary | ICD-10-CM | POA: Diagnosis not present

## 2024-08-01 LAB — POC COVID19/FLU A&B COMBO
Covid Antigen, POC: NEGATIVE
Influenza A Antigen, POC: NEGATIVE
Influenza B Antigen, POC: NEGATIVE

## 2024-08-01 MED ORDER — DOXYCYCLINE HYCLATE 100 MG PO TABS: 100.0000 mg | ORAL_TABLET | Freq: Two times a day (BID) | ORAL | 0 refills | Status: AC

## 2024-08-01 MED ORDER — EMPAGLIFLOZIN 25 MG PO TABS: 25.0000 mg | ORAL_TABLET | Freq: Every day | ORAL | 3 refills | Status: DC

## 2024-08-01 MED ORDER — FLUCONAZOLE 150 MG PO TABS: 150.0000 mg | ORAL_TABLET | Freq: Once | ORAL | 0 refills | Status: AC

## 2024-08-01 MED ORDER — GLIPIZIDE 5 MG PO TABS: ORAL_TABLET | ORAL | 3 refills | Status: AC

## 2024-08-01 MED ORDER — METHYLPREDNISOLONE 4 MG PO TABS: ORAL_TABLET | ORAL | 0 refills | Status: AC

## 2024-08-01 NOTE — Progress Notes (Signed)
 Patient Care Team: Perri Ronal PARAS, MD as PCP - General (Internal Medicine) Perri Bjork, PA-C as Physician Assistant (Obstetrics and Gynecology)  Visit Date: 08/01/24  Subjective:    Patient ID: Candice [name] , Female   DOB: 01-12-1956, 68 y.o.    MRN: [id number]   68 y.o. Female presents today for  cough. Patient has a past medical history of Diabetes Mellitus, Type II, Hypertriglyceridemia.  She started to wheeze on Friday night. She denies traveling traveling and said that nobody in her home has been sick. She has been experiencing a cough. Influenza and Covid-19 test were negative.   History of Diabetes Mellitus, Type II treated with Glipizide  5 mg up to 4 times daily, [name]  25 mg daily and Ozempic  1 mg weekly.    Past Medical History:  Diagnosis Date   Anxiety    Diabetes mellitus without complication (HCC)    Hx of colonic polyps    Fhx Colon cancer   Hyperlipidemia    Hypocholesteremia    Tobacco abuse      Family History  Problem Relation Age of Onset   AAA (abdominal aortic aneurysm) Mother    Hyperlipidemia Mother    Colon cancer Sister 65 - 73       estimated by patient   Cancer Sister        Colon   Diabetes Sister    Heart disease Sister    Heart attack Brother 49   Brain cancer Maternal Grandmother 45   Heart attack Maternal Grandfather    Rectal cancer Neg Hx    Stomach cancer Neg Hx    Social history: Married. 1 daughter. 1 granddaughter and 1 grandson. Husband is retired from [employer].      Review of Systems  Respiratory:  Positive for cough.         Objective:   Vitals: BP 100/60   Pulse 78   Temp 98 F (36.7 C)   Ht 5' 5 (1.651 m)   Wt 173 lb (78.5 kg)   SpO2 96%   BMI 28.79 kg/m    Physical Exam Vitals and nursing note reviewed.  Constitutional:      General: She is not in acute distress.    Appearance: Normal appearance. She is not ill-appearing.  HENT:     Head: Normocephalic and atraumatic.     Right  Ear: Tympanic membrane, ear canal and external ear normal.     Left Ear: Tympanic membrane, ear canal and external ear normal.     Ears:     Comments: Right and left ear full     Mouth/Throat:     Mouth: Mucous membranes are moist.     Pharynx: Oropharynx is clear. Posterior oropharyngeal erythema present. No oropharyngeal exudate.  Pulmonary:     Effort: Pulmonary effort is normal.     Breath sounds: Rhonchi present. No wheezing or rales.     Comments: Inspiratory rhonchi Lymphadenopathy:     Cervical: No cervical adenopathy.  Skin:    General: Skin is warm and dry.  Neurological:     Mental Status: She is alert and oriented to person, place, and time. Mental status is at baseline.  Psychiatric:        Mood and Affect: Mood normal.        Behavior: Behavior normal.        Thought Content: Thought content normal.        Judgment: Judgment normal.  Results:      Labs:       Component Value Date/Time   NA 138 12/21/2023 1047   K 4.8 12/21/2023 1047   CL 102 12/21/2023 1047   CO2 22 12/21/2023 1047   GLUCOSE 202 (H) 12/21/2023 1047   BUN 16 12/21/2023 1047   CREATININE 0.75 12/21/2023 1047   CALCIUM  10.1 12/21/2023 1047   PROT 7.3 12/21/2023 1047   ALBUMIN 4.5 04/08/2017 1002   AST 14 12/21/2023 1047   ALT 18 12/21/2023 1047   ALKPHOS 94 04/08/2017 1002   BILITOT 0.4 12/21/2023 1047   GFRNONAA 91 04/17/2021 1155   GFRAA 106 04/17/2021 1155     Lab Results  Component Value Date   WBC 8.6 12/21/2023   HGB 16.0 (H) 12/21/2023   HCT 47.9 (H) 12/21/2023   MCV 94.5 12/21/2023   PLT 244 12/21/2023    Lab Results  Component Value Date   CHOL 140 12/21/2023   HDL 44 (L) 12/21/2023   LDLCALC 61 12/21/2023   TRIG 337 (H) 12/21/2023   CHOLHDL 3.2 12/21/2023    Lab Results  Component Value Date   HGBA1C 9.1 (H) 12/21/2023     Lab Results  Component Value Date   TSH 1.10 12/21/2023        Assessment & Plan:    Meds ordered this encounter   Medications   empagliflozin  ([name] ) 25 MG TABS tablet    Sig: Take 1 tablet (25 mg total) by mouth daily.    Dispense:  90 tablet    Refill:  3   glipiZIDE  (GLUCOTROL ) 5 MG tablet    Sig: TAKE TWO TABLETS BEFORE BREAKFAST AND TAKE 1 TO 2 TABLETS BEFORE midnight    Dispense:  360 tablet    Refill:  3   doxycycline  (VIBRA -TABS) 100 MG tablet    Sig: Take 1 tablet (100 mg total) by mouth 2 (two) times daily.    Dispense:  20 tablet    Refill:  0   fluconazole  (DIFLUCAN ) 150 MG tablet    Sig: Take 1 tablet (150 mg total) by mouth once for 1 dose.    Dispense:  1 tablet    Refill:  0   methylPREDNISolone  (MEDROL ) 4 MG tablet    Sig: Take in tapering course as directed 6-5-4-3-2-1    Dispense:  21 tablet    Refill:  0   Acute lower respiratory infection: She started to wheeze on Friday night. She denies traveling traveling and said that no one else in her home has been sick. She has been experiencing a cough. Influenza and Covid-19 test were negative.     Doxycycline  100 mg twice daily  x 10 days prescribed.   Diflucan  150 mg once prescribed in case she develops Candida vaginitis on antibiotics and steroids.    Medrol  4 mg 6 day tapering course prescribed for cough and congestion. Start with 6 tabs on day 1 decreasing by one tab daily 6-5-4-3-2-1     Diabetes Mellitus, Type II: treated with Glipizide  5 mg up to 4 times daily, [name]  25 mg daily and Ozempic  1 mg weekly.     [name]  and Glipizide  refilled . Hgb AIC was 9.1% in March. Has follow up October 25.Would like to see better glucose control.    I,Makayla C Reid,acting as a scribe for Ronal JINNY Hailstone, MD.,have documented all relevant documentation on the behalf of Ronal JINNY Hailstone, MD,as directed by  Ronal JINNY Hailstone, MD while in the presence  of Ronal JINNY Hailstone, MD.

## 2024-08-01 NOTE — Patient Instructions (Addendum)
 Take Doxycycline  100 mg twice daily x 10 days.  Take Medrol  in tapering course as directed.Would like to see better diabetic control on [name]  and Glipizide .Has follow up on diabetes October 25th.Rest and stay well hydrated.

## 2024-08-11 NOTE — Progress Notes (Unsigned)
   08/11/2024  Patient ID: Candice [name], female   DOB: 10-17-1955, 68 y.o.   MRN: [id number]  This patient is appearing on a report for being at risk of failing the adherence measure for cholesterol (statin) medications this calendar year.   Medication: Rosuvastatin  40 mg tablets Last fill date: 04/24/24 for 90 day supply  MyChart message sent to patient.  [name] C. Jamesia Linnen [facility] PharmD Candidate Class of [phone]

## 2024-08-22 NOTE — Progress Notes (Signed)
 Candice [name]                                          MRN: [id number]   08/22/2024   The [facility] Quality Team Specialist reviewed this patient medical record for the purposes of chart review for care gap closure. The following were reviewed: chart review for care gap closure-glycemic status assessment.    [facility] Quality Team

## 2024-08-25 ENCOUNTER — Other Ambulatory Visit: Payer: Self-pay

## 2024-08-25 MED ORDER — ROSUVASTATIN CALCIUM 40 MG PO TABS: ORAL_TABLET | ORAL | 3 refills | Status: AC

## 2024-09-10 ENCOUNTER — Encounter: Payer: Self-pay | Admitting: Gastroenterology

## 2024-09-12 ENCOUNTER — Encounter: Payer: Self-pay | Admitting: Internal Medicine

## 2024-10-01 ENCOUNTER — Other Ambulatory Visit: Payer: Self-pay | Admitting: Nurse Practitioner

## 2024-10-03 ENCOUNTER — Telehealth: Payer: Self-pay | Admitting: Internal Medicine

## 2024-10-03 ENCOUNTER — Encounter: Payer: Self-pay | Admitting: Internal Medicine

## 2024-10-03 MED ORDER — MONTELUKAST SODIUM 10 MG PO TABS: 10.0000 mg | ORAL_TABLET | Freq: Every day | ORAL | 3 refills | Status: AC

## 2024-10-03 NOTE — Telephone Encounter (Signed)
 Refill Singulair  as requested to [facility]. MJB, MD

## 2024-11-04 ENCOUNTER — Other Ambulatory Visit: Payer: Self-pay

## 2024-11-04 DIAGNOSIS — E1169 Type 2 diabetes mellitus with other specified complication: Secondary | ICD-10-CM

## 2024-11-04 MED ORDER — EMPAGLIFLOZIN 25 MG PO TABS: 25.0000 mg | ORAL_TABLET | Freq: Every day | ORAL | 3 refills | Status: AC

## 2024-11-07 ENCOUNTER — Encounter: Payer: Self-pay | Admitting: *Deleted

## 2024-11-07 NOTE — Progress Notes (Signed)
 Candice [name]                                          MRN: [id number]   11/07/2024   The [facility] Quality Team Specialist reviewed this patient medical record for the purposes of chart review for care gap closure. The following were reviewed: chart review for care gap closure-glycemic status assessment.    [facility] Quality Team

## 2024-11-11 ENCOUNTER — Encounter: Payer: Self-pay | Admitting: Internal Medicine

## 2024-11-11 ENCOUNTER — Telehealth: Payer: Self-pay | Admitting: Internal Medicine

## 2024-11-11 DIAGNOSIS — Z20828 Contact with and (suspected) exposure to other viral communicable diseases: Secondary | ICD-10-CM

## 2024-11-11 MED ORDER — ONDANSETRON HCL 4 MG PO TABS: 4.0000 mg | ORAL_TABLET | Freq: Three times a day (TID) | ORAL | 0 refills | Status: DC | PRN

## 2024-11-11 MED ORDER — OSELTAMIVIR PHOSPHATE 75 MG PO CAPS: 75.0000 mg | ORAL_CAPSULE | Freq: Two times a day (BID) | ORAL | 0 refills | Status: AC

## 2024-11-11 MED ORDER — ONDANSETRON HCL 4 MG PO TABS: 4.0000 mg | ORAL_TABLET | Freq: Three times a day (TID) | ORAL | 0 refills | Status: AC | PRN

## 2024-11-11 MED ORDER — ONDANSETRON HCL 4 MG PO TABS: 4.0000 mg | ORAL_TABLET | Freq: Three times a day (TID) | ORAL | 1 refills | Status: DC | PRN

## 2024-11-11 NOTE — Telephone Encounter (Signed)
 Patient requesting Zofran  for nausea. Has had flu exposure from grandson and is not vaccinated. Rx:Zofran  tablets #20 with 1 refill one tab by mouth every 8 hours as needed for nausea. MJB, MD

## 2024-11-11 NOTE — Telephone Encounter (Signed)
 Exposure to influenza from grandson. Patient has not been vaccinated for flu this season. Sending in Tamiflu .MJB, MD
